# Patient Record
Sex: Female | Born: 1990 | Race: White | Hispanic: No | Marital: Married | Smoking: Never smoker
Health system: Southern US, Community
[De-identification: ages and names within clinical notes are randomized; demographics above are authoritative.]

## PROBLEM LIST (undated history)

## (undated) DIAGNOSIS — G08 Intracranial and intraspinal phlebitis and thrombophlebitis: Secondary | ICD-10-CM

## (undated) DIAGNOSIS — G932 Benign intracranial hypertension: Secondary | ICD-10-CM

## (undated) DIAGNOSIS — H4710 Unspecified papilledema: Secondary | ICD-10-CM

## (undated) DIAGNOSIS — F32A Depression, unspecified: Secondary | ICD-10-CM

## (undated) DIAGNOSIS — F411 Generalized anxiety disorder: Secondary | ICD-10-CM

## (undated) DIAGNOSIS — J45909 Unspecified asthma, uncomplicated: Secondary | ICD-10-CM

## (undated) DIAGNOSIS — F329 Major depressive disorder, single episode, unspecified: Secondary | ICD-10-CM

## (undated) HISTORY — DX: Unspecified papilledema: H47.10

## (undated) HISTORY — PX: SHUNT REVISION: SHX343

## (undated) HISTORY — PX: VENTRICULAR ATRIAL SHUNT: SHX2657

## (undated) HISTORY — DX: Generalized anxiety disorder: F41.1

## (undated) HISTORY — PX: CHOLECYSTECTOMY: SHX55

## (undated) HISTORY — DX: Intracranial and intraspinal phlebitis and thrombophlebitis: G08

---
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
## 2010-03-29 ENCOUNTER — Encounter: Payer: Self-pay | Admitting: Family Medicine

## 2010-05-17 ENCOUNTER — Ambulatory Visit: Payer: Self-pay | Admitting: Family Medicine

## 2010-05-17 DIAGNOSIS — L719 Rosacea, unspecified: Secondary | ICD-10-CM | POA: Insufficient documentation

## 2010-07-04 ENCOUNTER — Telehealth (INDEPENDENT_AMBULATORY_CARE_PROVIDER_SITE_OTHER): Payer: Self-pay | Admitting: *Deleted

## 2010-08-16 ENCOUNTER — Encounter: Payer: Self-pay | Admitting: Family Medicine

## 2010-09-10 HISTORY — PX: ANGIOPLASTY: SHX39

## 2010-09-25 ENCOUNTER — Telehealth: Payer: Self-pay | Admitting: Family Medicine

## 2010-09-25 ENCOUNTER — Ambulatory Visit
Admission: RE | Admit: 2010-09-25 | Discharge: 2010-09-25 | Payer: Self-pay | Source: Home / Self Care | Attending: Family Medicine | Admitting: Family Medicine

## 2010-09-25 ENCOUNTER — Encounter
Admission: RE | Admit: 2010-09-25 | Discharge: 2010-09-25 | Payer: Self-pay | Source: Home / Self Care | Attending: Family Medicine | Admitting: Family Medicine

## 2010-09-25 DIAGNOSIS — M79609 Pain in unspecified limb: Secondary | ICD-10-CM | POA: Insufficient documentation

## 2010-10-10 NOTE — Letter (Signed)
Summary: Medication's List/[facility]  Medication's List/[facility]   Imported By: Sherian Rein 07/12/2010 07:16:17  _____________________________________________________________________  External Attachment:    Type:   Image     Comment:   External Document

## 2010-10-10 NOTE — Progress Notes (Signed)
Summary: Adderall Refill   Phone Note Refill Request   Refills Requested: Medication #1:  ADDERALL 10 MG TABS Take 1 tablet by mouth two times a day Initial call taken by: Payton Spark CMA,  July 04, 2010 4:55 PM  Follow-up for Phone Call        Will refill once but I still need her records  Follow-up by: Nani Gasser MD,  July 05, 2010 8:02 AM  Additional Follow-up for Phone Call Additional follow up Details #1::        Pt aware of the above Additional Follow-up by: Payton Spark CMA,  July 05, 2010 8:12 AM    Prescriptions: ADDERALL 10 MG TABS (AMPHETAMINE-DEXTROAMPHETAMINE) Take 1 tablet by mouth two times a day  #60 x 0   Entered and Authorized by:   Nani Gasser MD   Signed by:   Nani Gasser MD on 07/05/2010   Method used:   Print then Give to Patient   RxID:   [id number]

## 2010-10-10 NOTE — Letter (Signed)
Summary: Date Range: 07-16-08 to 03-29-10/[facility]  Date Range: 07-16-08 to 03-29-10/[facility]   Imported By: Sherian Rein 07/12/2010 07:15:00  _____________________________________________________________________  External Attachment:    Type:   Image     Comment:   External Document

## 2010-10-10 NOTE — Assessment & Plan Note (Signed)
Summary: NOV: Perioral dermatitis   Vital Signs:  Patient profile:   20 year old female Height:      66.75 inches Weight:      208 pounds BMI:     32.94 Pulse rate:   89 / minute BP sitting:   129 / 79  (right arm) Cuff size:   regular  Vitals Entered By: Avon Gully CMA, Duncan Dull) (May 17, 2010 2:29 PM) CC: NP- rash around the mouth x several months has had oral steroids and topical steroids which helped but rash cam eback worse after finishing meds   CC:  NP- rash around the mouth x several months has had oral steroids and topical steroids which helped but rash cam eback worse after finishing meds.  History of Present Illness: NP- rash around the mouth x several months has had oral steroids and topical steroids which helped but rash cam eback worse after finishing meds. Started after she used her sisters facewash.  Steroid worked [employer] and then came back worked. Then took oral steroids adn that helped. Has perioral dermatitis.  Burning more than itichign. It is flakey and dry.    Habits & Providers  Alcohol-Tobacco-Diet     Alcohol drinks/day: 0     Tobacco Status: never  Exercise-Depression-Behavior     Does Patient Exercise: no     Drug Use: never     Seat Belt Use: always  Current Medications (verified): 1)  Adderall 10 Mg Tabs (Amphetamine-Dextroamphetamine) .... One Tablet By Mouth Once A Day  Allergies (verified): No Known Drug Allergies  Comments:  Nurse/Medical Assistant: The patient's medications and allergies were reviewed with the patient and were updated in the Medication and Allergy Lists. Avon Gully CMA, Duncan Dull) (May 17, 2010 2:31 PM)  Past History:  Past Medical History: None  Past Surgical History: None  Family History: GF with kidney Ca Aunt with BrCa GF with MI, DM Father wtih hi chol, HTN  Social History: Smoking Status:  never Does Patient Exercise:  no Drug Use:  never Seat Belt Use:  always  Review of  Systems       No fever/sweats/weakness, unexplained weight loss/gain.  No vison changes.  No difficulty hearing/ringing in ears, hay fever/allergies.  No chest pain/discomfort, palpitations.  No Br lump/nipple discharge.  No cough/wheeze.  No blood in BM, nausea/vomiting/diarrhea.  No nighttime urination, leaking urine, unusual vaginal bleeding, discharge (penis or vagina).  No muscle/joint pain. + rash,no  change in mole.  No HA, memory loss.  No anxiety, sleep d/o, depression.  No easy bruising/bleeding, unexplained lump   Physical Exam  General:  Well-developed,well-nourished,in no acute distress; alert,appropriate and cooperative throughout examination Skin:  erythematous fine papules around her mouth and on her chin. they appear dry withfine scale.  No breaks in the skin.    Impression & Recommendations:  Problem # 1:  DERMATITIS, PERIORAL (ICD-695.3) Discussed dx.  REviewed tx. Stop steroid and tx with erythromycin gel. If not helping can change to metro gel. Call back if not better in 2 weeks. She does have a derm appt in 2 weeks.   Complete Medication List: 1)  Adderall 10 Mg Tabs (Amphetamine-dextroamphetamine) .... Take 1 tablet by mouth three times a day 2)  Erythromycin 2 % Gel (Erythromycin) .... Apply two times a day to affected area.  Patient Instructions: 1)  Trial of the erythromycin gel. If not better then let me know.  Prescriptions: ERYTHROMYCIN 2 % GEL (ERYTHROMYCIN) Apply two times a day  to affected area.  #1 tube. x 0   Entered and Authorized by:   Nani Gasser MD   Signed by:   Nani Gasser MD on 05/17/2010   Method used:   Electronically to        [facility]  [address] [phone]* (retail)       [address]       [place], [place]  [address]       Ph: [phone]       Fax: [phone]   RxID:   [id number]   Appended Document: NOV: Perioral dermatitis /call from pharm no longer makes e-myacin gel. will chane to [facility] 05/21/10  acm.Prescriptions: Prescriptions: METRONIDAZOLE 0.75 % GEL (METRONIDAZOLE) apply to affected area BID  #1 tube x 0   Entered by:   Avon Gully CMA, (AAMA)   Authorized by:   Nani Gasser MD   Signed by:   Avon Gully CMA, (AAMA) on 05/17/2010   Method used:   Electronically to        [facility] [phone]* (retail)       [address]       [place], [place]  [address]       Ph: [phone]       Fax: [phone]   RxID:   [id number]   Appended Document: NOV: Perioral dermatitis

## 2010-10-12 NOTE — Assessment & Plan Note (Signed)
Summary: Foot pain   Vital Signs:  Patient profile:   20 year old female Height:      66.75 inches Weight:      211 pounds Pulse rate:   80 / minute BP sitting:   136 / 77  (right arm) Cuff size:   large  Vitals Entered By: Avon Gully CMA, Duncan Dull) (September 25, 2010 3:02 PM) CC: foot pain in rt foot x 1 1/.2 weeks, pain shoots through toes when moving toes   CC:  foot pain in rt foot x 1 1/.2 weeks and pain shoots through toes when moving toes.  History of Present Illness: foot pain in rt foot x 1 1/.2 weeks, pain shoots through toes when moving toes.  Pain on the top of her foot. pianful to touch. Some pain wiht walking but feels more like a pressure. Not sure if injured the area.  No bruising but didn initally have some swelling. Taking motrin and has put ice on it. Propped it up every night. Taking Advil and it does seem to help.   Current Medications (verified): 1)  Adderall 10 Mg Tabs (Amphetamine-Dextroamphetamine) .... Take 1 Tablet By Mouth Two Times A Day  Allergies (verified): No Known Drug Allergies  Comments:  Nurse/Medical Assistant: The patient's medications and allergies were reviewed with the patient and were updated in the Medication and Allergy Lists. Avon Gully CMA, Duncan Dull) (September 25, 2010 3:03 PM)  Physical Exam  Msk:  Right foot is tender over the dorsum near the ankle. She is able to flexion and extend her ankle and toes abut has pain with moving her toes and with eversion of her foot.  Ankle strength 5/5. Non swelling or bruising over the site. No rash.    Impression & Recommendations:  Problem # 1:  FOOT PAIN (ICD-729.5) Suspect straing for which tx is PRICE.  Will get xray to rule out fracture since she is soo tender over teh area and since she would really like ot go skiiing this weekend.   Orders: T-DG Foot 2 Views*R* (95621)  Complete Medication List: 1)  Adderall 10 Mg Tabs (Amphetamine-dextroamphetamine) .... Take 1 tablet by  mouth two times a day  Patient Instructions: 1)  We will call you with the xray resullts.    Orders Added: 1)  T-DG Foot 2 Views*R* [73620] 2)  Est. Patient Level IV [30865]

## 2010-10-12 NOTE — Letter (Signed)
Summary: Records Dated 07-16-08 thru 05-17-10/[facility]  Records Dated 07-16-08 thru 05-17-10/[facility]   Imported By: Lanelle Bal 08/25/2010 11:25:17  _____________________________________________________________________  External Attachment:    Type:   Image     Comment:   External Document

## 2010-10-12 NOTE — Progress Notes (Signed)
Summary: Refill Adderall  Phone Note Refill Request Call back at Home Phone [phone]   Refills Requested: Medication #1:  ADDERALL 10 MG TABS Take 1 tablet by mouth two times a day   Dosage confirmed as above?Dosage Confirmed   Supply Requested: 1 month   Last Refilled: 08/16/2010 Pt will be seen today at 3pm for foot injury   Method Requested: Pick up at Office Initial call taken by: Francee Piccolo CMA Duncan Dull),  September 25, 2010 10:51 AM    Prescriptions: ADDERALL 10 MG TABS (AMPHETAMINE-DEXTROAMPHETAMINE) Take 1 tablet by mouth two times a day  #60 x 0   Entered and Authorized by:   Nani Gasser MD   Signed by:   Nani Gasser MD on 09/25/2010   Method used:   Printed then faxed to ...       [facility]  [address] [phone]* (retail)       [address]       [place], [place]  [address]       Ph: [phone]       Fax: [phone]   RxID:   [id number]

## 2010-10-20 ENCOUNTER — Ambulatory Visit (INDEPENDENT_AMBULATORY_CARE_PROVIDER_SITE_OTHER): Payer: BC Managed Care – PPO | Admitting: Family Medicine

## 2010-10-20 ENCOUNTER — Encounter: Payer: Self-pay | Admitting: Family Medicine

## 2010-10-20 DIAGNOSIS — J0100 Acute maxillary sinusitis, unspecified: Secondary | ICD-10-CM | POA: Insufficient documentation

## 2010-10-26 NOTE — Letter (Signed)
Summary: Out of [facility]  [address], [address]   [place], [place] [address]   Phone: [phone]  Fax: [phone]    October 20, 2010   Student:  [employer]    To Whom It May Concern:   For Medical reasons, please excuse the above named student from school for the following dates:  Start:   February 8th and 10th, 2012  End:    Feb 11th  If you need additional information, please feel free to contact our office.   Sincerely,    Seymour Bars DO    ****This is a legal document and cannot be tampered with.  Schools are authorized to verify all information and to do so accordingly.

## 2010-10-26 NOTE — Assessment & Plan Note (Signed)
Summary: sinusitis   Vital Signs:  Patient profile:   20 year old female Height:      66.75 inches Weight:      213 pounds BMI:     33.73 O2 Sat:      98 % on Room air Pulse rate:   104 / minute BP sitting:   139 / 85  (left arm) Cuff size:   large  Vitals Entered By: Payton Spark CMA (October 20, 2010 2:36 PM)  O2 Flow:  Room air CC: head pressure and nasal drainage x 1 week.    Primary Care Provider:  Nani Gasser MD  CC:  head pressure and nasal drainage x 1 week. [name] Kitchen  History of Present Illness: 20 yo WF presents for head congestion, sinus pressure and postnasal drip x 10 days.   Taking Sudafed OTC but it's not helping.  Has a moderate to severe HA.  No cough or chest tightness.  Little sore throat.  Ears are popping but not pain painful.  She does have allergies and has had frequent sinusitis and has not been taking anything.  No fevers or chills.  Appeitite and energy level unchanged.      Current Medications (verified): 1)  Adderall 10 Mg Tabs (Amphetamine-Dextroamphetamine) .... Take 1 Tablet By Mouth Two Times A Day  Allergies (verified): No Known Drug Allergies  Past History:  Past Medical History: Reviewed history from 05/17/2010 and no changes required. None  Review of Systems      See HPI  Physical Exam  General:  alert, well-developed, well-nourished, and well-hydrated.   Head:  normocephalic and atraumatic.  maxillary sinuses TTP Eyes:  conjunctiva clear; allergic shiners present Ears:  EACs patent; TMs translucent and gray with good cone of light and bony landmarks.  Nose:  nasal congestion with boggy turbinates Mouth:  o/p mildly injected with clear postnasal drip Neck:  no masses.   Lungs:  Normal respiratory effort, chest expands symmetrically. Lungs are clear to auscultation, no crackles or wheezes. Heart:  Normal rate and regular rhythm. S1 and S2 normal without gallop, murmur, click, rub or other extra sounds. Skin:  color normal.     Cervical Nodes:  shoddy anterior cervical chain LA   Impression & Recommendations:  Problem # 1:  ACUTE MAXILLARY SINUSITIS (ICD-461.0)  Her updated medication list for this problem includes:    Amoxicillin 875 Mg Tabs (Amoxicillin) .[name] Kitchen... 1 tab by mouth two times a day with food x 10 days  Instructed on treatment. Call if symptoms persist or worsen.   Complete Medication List: 1)  Adderall 10 Mg Tabs (Amphetamine-dextroamphetamine) .... Take 1 tablet by mouth two times a day 2)  Amoxicillin 875 Mg Tabs (Amoxicillin) .[name] Kitchen.. 1 tab by mouth two times a day with food x 10 days  Patient Instructions: 1)  Treat sinusitis with 10 days of Amoxicillin. 2)  Use OTC Advil Cold and Sinus or Mucinex D for symptom relief. 3)  Call if not improved after 10 days. Prescriptions: AMOXICILLIN 875 MG TABS (AMOXICILLIN) 1 tab by mouth two times a day with food x 10 days  #20 x 0   Entered and Authorized by:   Seymour Bars DO   Signed by:   Seymour Bars DO on 10/20/2010   Method used:   Electronically to        [facility]  [id number]* (retail)       [address]       [place], [place]  [address]       Ph: [phone]       Fax: [phone]   RxID:   [id number]    Orders Added: 1)  Est. Patient Level III [27253]

## 2010-10-30 ENCOUNTER — Telehealth: Payer: Self-pay | Admitting: Family Medicine

## 2010-11-07 NOTE — Progress Notes (Signed)
  Phone Note Refill Request Message from:  Patient on October 30, 2010 1:43 PM  Refills Requested: Medication #1:  ADDERALL 10 MG TABS Take 1 tablet by mouth two times a day Initial call taken by: Avon Gully CMA, Duncan Dull),  October 30, 2010 1:43 PM    Prescriptions: ADDERALL 10 MG TABS (AMPHETAMINE-DEXTROAMPHETAMINE) Take 1 tablet by mouth two times a day  #60 x 0   Entered by:   Avon Gully CMA, (AAMA)   Authorized by:   Nani Gasser MD   Signed by:   Avon Gully CMA, (AAMA) on 10/30/2010   Method used:   Print then Give to Patient   RxID:   [id number]

## 2010-12-04 ENCOUNTER — Other Ambulatory Visit: Payer: Self-pay | Admitting: Family Medicine

## 2010-12-04 DIAGNOSIS — F988 Other specified behavioral and emotional disorders with onset usually occurring in childhood and adolescence: Secondary | ICD-10-CM

## 2010-12-04 MED ORDER — AMPHETAMINE-DEXTROAMPHETAMINE 10 MG PO TABS: 10.0000 mg | ORAL_TABLET | Freq: Two times a day (BID) | ORAL | Status: DC

## 2010-12-04 NOTE — Telephone Encounter (Signed)
Printed rx with the wrong quantity. rx is supposed to be written for 60 tabs.will shred first rx

## 2011-01-12 ENCOUNTER — Other Ambulatory Visit: Payer: Self-pay | Admitting: *Deleted

## 2011-01-12 ENCOUNTER — Other Ambulatory Visit: Payer: Self-pay | Admitting: Family Medicine

## 2011-01-12 DIAGNOSIS — F988 Other specified behavioral and emotional disorders with onset usually occurring in childhood and adolescence: Secondary | ICD-10-CM

## 2011-01-12 DIAGNOSIS — F909 Attention-deficit hyperactivity disorder, unspecified type: Secondary | ICD-10-CM

## 2011-01-12 MED ORDER — AMPHETAMINE-DEXTROAMPHETAMINE 10 MG PO TABS: 10.0000 mg | ORAL_TABLET | Freq: Two times a day (BID) | ORAL | Status: DC

## 2011-01-12 NOTE — Telephone Encounter (Signed)
Pt called and needs refill of adderall 10 mg twice daily. Plan:  Routed to Dr. Marlyne Beards, LPN Domingo Dimes

## 2011-01-12 NOTE — Telephone Encounter (Signed)
Pt picked up rx

## 2011-03-07 ENCOUNTER — Telehealth: Payer: Self-pay | Admitting: Family Medicine

## 2011-03-07 ENCOUNTER — Encounter: Payer: Self-pay | Admitting: Family Medicine

## 2011-03-07 DIAGNOSIS — F988 Other specified behavioral and emotional disorders with onset usually occurring in childhood and adolescence: Secondary | ICD-10-CM

## 2011-03-07 MED ORDER — AMPHETAMINE-DEXTROAMPHETAMINE 10 MG PO TABS: 10.0000 mg | ORAL_TABLET | Freq: Two times a day (BID) | ORAL | Status: DC

## 2011-03-07 NOTE — Telephone Encounter (Signed)
Patient called she needs Adderol refill asap

## 2011-03-07 NOTE — Telephone Encounter (Signed)
Adderall script printed out per Dr. Cathey Endow order, and will retrieve in the morning at the office, and have Dr. Cathey Endow sign. Jarvis Newcomer, LPN Domingo Dimes

## 2011-03-07 NOTE — Telephone Encounter (Signed)
OK to print and I will sign in tomorrow morning for pick up.  Thanks.

## 2011-04-18 ENCOUNTER — Ambulatory Visit (INDEPENDENT_AMBULATORY_CARE_PROVIDER_SITE_OTHER): Payer: BC Managed Care – PPO | Admitting: Family Medicine

## 2011-04-18 DIAGNOSIS — B079 Viral wart, unspecified: Secondary | ICD-10-CM

## 2011-04-18 NOTE — Progress Notes (Signed)
  Subjective:    Patient ID: Maria Payne, female    DOB: [birth year], 20 y.o.   MRN: [id number]  HPI Wart on right leg below  The knee that started about 2 months ago.  She tried OTC freezing and helped but then came back. She has had a plantar wart before.     Review of Systems     Objective:   Physical Exam  Skin:       She has 0.75 cm wart on her left leg just below her knee. It is irritated and has some bleeding.            Assessment & Plan:  Inflammed wart - Discussed can do cryotherapy and the duct tape method. REviewed this and gave her H.O. On how to do the duct tape method. Unfortunately we are our of cryotherapy so she return on Friday and we will do this for free.

## 2011-04-27 ENCOUNTER — Encounter: Payer: Self-pay | Admitting: Family Medicine

## 2011-04-27 ENCOUNTER — Ambulatory Visit (INDEPENDENT_AMBULATORY_CARE_PROVIDER_SITE_OTHER): Payer: BC Managed Care – PPO | Admitting: Family Medicine

## 2011-04-27 VITALS — BP 113/75 | HR 93 | Wt 230.0 lb

## 2011-04-27 DIAGNOSIS — F988 Other specified behavioral and emotional disorders with onset usually occurring in childhood and adolescence: Secondary | ICD-10-CM

## 2011-04-27 MED ORDER — AMPHETAMINE-DEXTROAMPHETAMINE 10 MG PO TABS: 10.0000 mg | ORAL_TABLET | Freq: Two times a day (BID) | ORAL | Status: DC

## 2011-04-27 NOTE — Progress Notes (Signed)
  Subjective:    Patient ID: Maria Payne, female    DOB: [birth year], 20 y.o.   MRN: [id number]  HPI Here for cryotherapy on her wart on Right shin.  We were out of liquid nitrogen when she was here last time so here today.  Crytherapy was performed and pt tolerated well.    Review of Systems     Objective:   Physical Exam        Assessment & Plan:

## 2011-06-22 ENCOUNTER — Other Ambulatory Visit: Payer: Self-pay | Admitting: *Deleted

## 2011-06-22 DIAGNOSIS — F988 Other specified behavioral and emotional disorders with onset usually occurring in childhood and adolescence: Secondary | ICD-10-CM

## 2011-06-22 MED ORDER — AMPHETAMINE-DEXTROAMPHETAMINE 10 MG PO TABS: 10.0000 mg | ORAL_TABLET | Freq: Two times a day (BID) | ORAL | Status: DC

## 2011-06-26 ENCOUNTER — Telehealth: Payer: Self-pay | Admitting: Family Medicine

## 2011-06-26 NOTE — Telephone Encounter (Signed)
Pt notified, and had to [facility] instructing her that her adderall prescription was ready for pup. Jarvis Newcomer, LPN Domingo Dimes

## 2011-08-10 ENCOUNTER — Other Ambulatory Visit: Payer: Self-pay

## 2011-08-10 DIAGNOSIS — R519 Headache, unspecified: Secondary | ICD-10-CM

## 2011-08-12 ENCOUNTER — Ambulatory Visit
Admission: RE | Admit: 2011-08-12 | Discharge: 2011-08-12 | Disposition: A | Discharge status: Home / Self Care | Payer: BC Managed Care – PPO | Source: Ambulatory Visit

## 2011-08-12 DIAGNOSIS — R519 Headache, unspecified: Secondary | ICD-10-CM

## 2011-08-12 MED ORDER — GADOBENATE DIMEGLUMINE 529 MG/ML IV SOLN
20.0000 mL | Freq: Once | INTRAVENOUS | Status: AC | PRN
  Administered 2011-08-12: 20 mL via INTRAVENOUS

## 2011-08-14 ENCOUNTER — Telehealth: Payer: Self-pay | Admitting: *Deleted

## 2011-08-14 NOTE — Telephone Encounter (Signed)
Pt calls and would like results of her MRI she had done. Her eye doc ordered it because of abnormal eye exam and sent her downstairs to get it so you would get results. Looked at results under imaging tab and looks like ? MS. Please advise

## 2011-08-14 NOTE — Telephone Encounter (Signed)
Have we gotten a note from optho? I feel uncomfortable going over results on a test I haven't ordered and I havent' seen her in months. She really needs to ave her ophtho review with her and then if he wants me to address for referrals or such then I would be happy to. I haven't heard from optho at all.

## 2011-08-20 DIAGNOSIS — H4711 Papilledema associated with increased intracranial pressure: Secondary | ICD-10-CM | POA: Insufficient documentation

## 2011-09-10 ENCOUNTER — Encounter: Payer: Self-pay | Admitting: Family Medicine

## 2011-09-10 DIAGNOSIS — G08 Intracranial and intraspinal phlebitis and thrombophlebitis: Secondary | ICD-10-CM

## 2011-09-10 DIAGNOSIS — G932 Benign intracranial hypertension: Secondary | ICD-10-CM | POA: Insufficient documentation

## 2011-09-10 HISTORY — DX: Intracranial and intraspinal phlebitis and thrombophlebitis: G08

## 2011-12-26 ENCOUNTER — Encounter: Payer: Self-pay | Admitting: Family Medicine

## 2011-12-26 ENCOUNTER — Telehealth: Payer: Self-pay | Admitting: *Deleted

## 2011-12-26 ENCOUNTER — Other Ambulatory Visit (HOSPITAL_COMMUNITY)
Admission: RE | Admit: 2011-12-26 | Discharge: 2011-12-26 | Disposition: A | Discharge status: Home / Self Care | Payer: BC Managed Care – PPO | Source: Ambulatory Visit | Attending: Family Medicine | Admitting: Family Medicine

## 2011-12-26 ENCOUNTER — Ambulatory Visit (INDEPENDENT_AMBULATORY_CARE_PROVIDER_SITE_OTHER): Payer: BC Managed Care – PPO | Admitting: Family Medicine

## 2011-12-26 VITALS — BP 120/81 | HR 82 | Ht 68.0 in | Wt 232.0 lb

## 2011-12-26 DIAGNOSIS — Z01419 Encounter for gynecological examination (general) (routine) without abnormal findings: Secondary | ICD-10-CM

## 2011-12-26 MED ORDER — CLINDAMYCIN PHOSPHATE 1 % EX GEL: Freq: Two times a day (BID) | CUTANEOUS | Status: DC

## 2011-12-26 NOTE — Telephone Encounter (Signed)
Ok, please call Tatiana and let her know that Dr. Craige Cotta says not at this time. Maybe we an re-eval in 6-12 months.  Certainly can use condoms to prevent pregnancy for now.

## 2011-12-26 NOTE — Telephone Encounter (Signed)
Nurse from Dr. Evlyn Courier office called back and states Dr. Craige Cotta does not recommend pt start birth control at this time due to the hormonal effect this would have on her condition. She also advised that the pt should lose at least another 30lbs or so before considering starting birth [phone] Dr. Evlyn Courier office)

## 2011-12-26 NOTE — Patient Instructions (Signed)
Start a regular exercise program and make sure you are eating a healthy diet Try to eat 4 servings of dairy a day or take a calcium supplement (500mg twice a day). Your vaccines are up to date.  We will call you with your lab results. If you don't here from us in about a week then please give us a call at [phone].  

## 2011-12-26 NOTE — Progress Notes (Signed)
Subjective:     Maria Payne is a 21 y.o. female and is here for a comprehensive physical exam. The patient reports no problems.She is getting married soon and wants to start birth control. SHe has a hx of pseudotumor cerebri and recently had to have angioplasty. Her neurologist is Dr. Loyal Gambler in [facility].  History   Social History  . Marital Status: Single    Spouse Name: N/A    Number of Children: N/A  . Years of Education: N/A   Occupational History  . unemployed.     Social History Main Topics  . Smoking status: Never Smoker   . Smokeless tobacco: Not on file  . Alcohol Use: Yes     occ  . Drug Use: Not on file  . Sexually Active: Yes -- Female partner(s)   Other Topics Concern  . Not on file   Social History Narrative  . No narrative on file   Health Maintenance  Topic Date Due  . Pap Smear  02/22/2008  . Tetanus/tdap  02/21/2009  . Influenza Vaccine  06/10/2012    The following portions of the patient's history were reviewed and updated as appropriate: allergies, current medications, past family history, past medical history, past social history, past surgical history and problem list.  Review of Systems A comprehensive review of systems was negative.   Objective:    BP 120/81  Pulse 82  Ht 5\' 8"  (1.727 m)  Wt 232 lb (105.235 kg)  BMI 35.28 kg/m2  LMP 12/16/2011 General appearance: alert, cooperative, appears stated age and moderately obese Head: Normocephalic, without obvious abnormality, atraumatic Eyes: conj clear, EOMi, PEERLA Ears: normal TM's and external ear canals both ears Nose: Nares normal. Septum midline. Mucosa normal. No drainage or sinus tenderness. Throat: lips, mucosa, and tongue normal; teeth and gums normal Neck: no adenopathy, no carotid bruit, supple, symmetrical, trachea midline and thyroid not enlarged, symmetric, no tenderness/mass/nodules Back: symmetric, no curvature. ROM normal. No CVA tenderness. Lungs: clear to  auscultation bilaterally Breasts: normal appearance, no masses or tenderness Heart: regular rate and rhythm, S1, S2 normal, no murmur, click, rub or gallop Abdomen: soft, non-tender; bowel sounds normal; no masses,  no organomegaly Pelvic: cervix normal in appearance, external genitalia normal, no adnexal masses or tenderness, no cervical motion tenderness, rectovaginal septum normal, uterus normal size, shape, and consistency, vagina normal without discharge and she does have a inflammed hair follicle on her left labia Extremities: extremities normal, atraumatic, no cyanosis or edema Pulses: 2+ and symmetric Skin: Skin color, texture, turgor normal. No rashes or lesions, she does have a mole under her left breast that appears benign but is where her underwire sits.  Lymph nodes: Cervical, supraclavicular, and axillary nodes normal. Neurologic: Grossly normal    Assessment:    Healthy female exam.      Plan:    See After Visit Summary for Counseling Recommendations  Start a regular exercise program and make sure you are eating a healthy diet Try to eat 4 servings of dairy a day or take a calcium supplement (500mg  twice a day). Your vaccines are up to date.  Due for screening labs, CMP and lipids  Folliculitis - rec warm compresses and apply clindagel bid to affected area. Call if not clearing up in 2-3 weeks  Nevus under left breast - Recommen removal since gets very easiy irritated.  Patient says she has noticed some color change as well.   Will call her neurologist, Dr. Craige Cotta, so see  if Ok to start birth control. I do not believe there is a contraindication because of her pseudotumor cerebra but because of her recent angioplasty there may be a contraindication because of increased risk of thrombosis with birth control.

## 2011-12-27 LAB — COMPLETE METABOLIC PANEL WITH GFR
ALT: 8 U/L (ref 0–35)
AST: 12 U/L (ref 0–37)
Albumin: 4.5 g/dL (ref 3.5–5.2)
Alkaline Phosphatase: 60 U/L (ref 39–117)
BUN: 23 mg/dL (ref 6–23)
CO2: 21 mEq/L (ref 19–32)
Calcium: 9.3 mg/dL (ref 8.4–10.5)
Chloride: 113 mEq/L — ABNORMAL HIGH (ref 96–112)
Creat: 0.95 mg/dL (ref 0.50–1.10)
GFR, Est African American: >89 mL/min
GFR, Est Non African American: 86 mL/min
Glucose, Bld: 101 mg/dL — ABNORMAL HIGH (ref 70–99)
Potassium: 4.1 mEq/L (ref 3.5–5.3)
Sodium: 144 mEq/L (ref 135–145)
Total Bilirubin: 0.2 mg/dL — ABNORMAL LOW (ref 0.3–1.2)
Total Protein: 6.8 g/dL (ref 6.0–8.3)

## 2011-12-27 LAB — LIPID PANEL
Cholesterol: 179 mg/dL (ref 0–200)
HDL: 37 mg/dL — ABNORMAL LOW (ref >39)
LDL Cholesterol: 116 mg/dL — ABNORMAL HIGH (ref 0–99)
Total CHOL/HDL Ratio: 4.8 Ratio
Triglycerides: 130 mg/dL (ref <150)
VLDL: 26 mg/dL (ref 0–40)

## 2011-12-27 NOTE — Telephone Encounter (Signed)
Left message on vm with both doctors advise

## 2012-04-04 ENCOUNTER — Ambulatory Visit (INDEPENDENT_AMBULATORY_CARE_PROVIDER_SITE_OTHER): Payer: BC Managed Care – PPO | Admitting: Psychiatry

## 2012-04-04 ENCOUNTER — Encounter (HOSPITAL_COMMUNITY): Payer: Self-pay | Admitting: Psychiatry

## 2012-04-04 VITALS — BP 118/78 | Ht 68.0 in | Wt 234.0 lb

## 2012-04-04 DIAGNOSIS — F411 Generalized anxiety disorder: Secondary | ICD-10-CM

## 2012-04-04 DIAGNOSIS — F329 Major depressive disorder, single episode, unspecified: Secondary | ICD-10-CM

## 2012-04-04 HISTORY — DX: Generalized anxiety disorder: F41.1

## 2012-04-04 MED ORDER — BUPROPION HCL ER (XL) 150 MG PO TB24: 150.0000 mg | ORAL_TABLET | ORAL | Status: DC

## 2012-04-04 NOTE — Progress Notes (Addendum)
Outpatient Psychiatry Initial Intake  04/04/2012  Orlan Leavens, a 21 y.o. female, for initial evaluation visit. Patient is referred by  her neurologist, Dr. Loyal Gambler.    HPI: The patient is a 21 year old female who is referred by her neurologist for evaluation and treatment of depressive disorder. The patient reports her depression began approximately 2 years ago. The man she considered to be a second father figure ground while at the beach. She felt that she has never bounced back from this. Soon after, the patient started having severe headaches. Her ophthalmologist noticed that her optic nerves were swollen. There was concern about her having blood clots. The patient had an MRI/MRA. 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 She had angioplasty last year. She continues to still get the headaches. The patient was diagnosed with ADHD in elementary school. She has been off of her stimulants for approximately 3 years. She reports she did see a psychiatrist in [place] a few years ago. She was diagnosed at that time with bipolar disorder. She states that she was placed on the wrong medications, and stopped going to see her. The patient endorses low energy and low mood. She is not working. 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 She lives with her parents, but is getting married in September. Both her fianc and dad are frustrated with her that she is not working. The patient reports she gets out of bed around 10 AM. She eats breakfast and then lies back down. She endorses poor sleep at night. She will be up until about 3 AM. She states her mother is the same way. She reports that when she was growing up, her mother had severe anxiety and depression along with agorophobia. The patient's mom basically locked herself in the bedroom, and the patient's sister who is 5 years older raised her and the third sister. The patient endorses high  levels of anxiety, which aren't as bad right now. She states that she is over planner. She states that everything has to be just right where she will stressed about it. She states that she is more depressed at the current time. She tends to cry more. She endorses fleeting suicidal ideation without plan or intent. She thinks what would happen if she wrecked her car. He denies any hallucinations. She denies any substance abuse. The patient has gained weight over the past 2 or 3 years. She is very unhappy about this. She feels that the Adderall helped keep her weight down. She endorses good focus and attention now that she's not in school. Her fianc is a Teacher, early years/pre at [employer]. He wants to become a Optician, dispensing. Her father is in [employer], and one of her brother-in-law's is a Optician, dispensing also. Filed Vitals:   04/04/12 0938  BP: 118/78     Physical Illness:  Patient diagnosed with pseudotumor cerebri yet along with idiopathic intracranial hypertension last year.  Current Medications: Scheduled Meds:  Topamax Lasix  Allergies: Allergies  Allergen Reactions  . Codeine     Stressors:  Living with parents, wedding this year, health problems.  History:   Past Psychiatric History:  Previous therapy: no Previous psychiatric treatment and medication trials: yes - diagnosed with ADHD in the past. Previous trials of Adderall and Ritalin. Diagnosed bipolar by psychiatrist to [facility]. Patient is also been on Cymbalta in the past. Previous psychiatric hospitalizations: yes - hospitalized at age 43  at [facility] Previous diagnoses: yes - as above Previous suicide attempts: no History of violence: no Currently in treatment with no one.  Family Psychiatric History: Mom is depression, anxiety, and agorophobia. Dad with a history of alcohol and substance abuse. Middle sister with depression.  Family Health History: Dad with hypertension and MGUS (a condition  similar to multiple myeloma)  Personal and Social History: Patient is engaged to be married to her boyfriend of 3 years. She currently lives with her parents. She is the youngest of 3 girls. She has a difficult time with her father, and her middle sister.   Education: The patient graduated high school.  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 She is currently not working.   Review Of Systems:   Medical Review Of Systems: Constitutional: negative, Except for daily headaches  Psychiatric Review Of Systems: Sleep: no Appetite changes: yes Weight changes: yes Energy: no Interest/pleasure/anhedonia: no Somatic symptoms: no Libido: yes Anxiety/panic: yes Guilty/hopeless: yes Self-injurious behavior/risky behavior: no Any drugs: no Alcohol: no   Current Evaluation:    Mental Status Evaluation: Appearance:  casually dressed and overweight  Behavior:  normal  Speech:  soft  Mood:  depressed  Affect:  constricted  Thought Process:  normal  Thought Content:  normal  Sensorium:  person, place, time/date and situation  Cognition:  grossly intact  Insight:  fair  Judgment:  fair        Assessment - Diagnosis - Goals:   Axis I: Generalized Anxiety Disorder and Major Depression, single episode Axis II: Deferred Axis III: Obesity, pseudotumor cerebrally, idiopathic intracranial hypertension Axis IV: occupational problems and problems related to social environment Axis V: 51-60 moderate symptoms   Treatment Plan/Recommendations: I have conferred with Dr. Craige Cotta. I will start the patient on Wellbutrin XL 150 mg daily. Patient may call in 3-4 weeks to increase. I will see the patient back in 6 weeks. She may benefit from the addition of an SSRI in the future. Patient may call with concerns.     Blades, Etana Beets PATRICIA

## 2012-05-16 ENCOUNTER — Ambulatory Visit (HOSPITAL_COMMUNITY): Payer: Self-pay | Admitting: Psychiatry

## 2012-05-20 ENCOUNTER — Ambulatory Visit (HOSPITAL_COMMUNITY): Payer: Self-pay | Admitting: Psychiatry

## 2012-07-02 ENCOUNTER — Encounter (HOSPITAL_COMMUNITY): Payer: Self-pay | Admitting: Psychiatry

## 2012-07-02 ENCOUNTER — Ambulatory Visit (INDEPENDENT_AMBULATORY_CARE_PROVIDER_SITE_OTHER): Payer: BC Managed Care – PPO | Admitting: Psychiatry

## 2012-07-02 VITALS — BP 138/95 | Ht 68.0 in | Wt 243.0 lb

## 2012-07-02 DIAGNOSIS — F411 Generalized anxiety disorder: Secondary | ICD-10-CM

## 2012-07-02 DIAGNOSIS — F329 Major depressive disorder, single episode, unspecified: Secondary | ICD-10-CM

## 2012-07-02 MED ORDER — BUPROPION HCL ER (XL) 300 MG PO TB24: 300.0000 mg | ORAL_TABLET | ORAL | Status: DC

## 2012-07-02 NOTE — Progress Notes (Signed)
   [facility] Behavioral Health Follow-up Outpatient Visit  Maria Payne 1990/10/17   Subjective: The patient is a 21 year old flame on who was seen for initial psychiatric assessment in July of 2013. She had been referred by Dr. Craige Cotta. She had depression going on and off for 2 years. It all precipitated by a father figure who drowned at the beach. Soon after the patient began having severe headaches. She saw Dr. Craige Cotta and was diagnosed with pseudotumor cerebri. At her initial appointment, the patient was sleeping all the time. She wasn't working. She gained weight over the previous 2-3 years. She was engaged to be married in September. The patient had a history of ADHD but it been off stimulants for 3 years. At her initial appointment, I diagnosed her with major depressive disorder along with generalized anxiety disorder. I started her on Wellbutrin XL 150 mg daily. She was to return to the office in 6 weeks. The patient presents today almost 3 months later. She does get married in September. She has been consistent with her Wellbutrin XL. She has started working at [employer] and has been there now for about one month. She is standing a lot, which is difficult. There is one girl at work who is giving her a hard time. She and her husband worked different schedules. Today is the first day they have all together since their honeymoon.  The patient feels benefits from the Wellbutrin XL. She is sleeping better at night. She is finding it easier to calm down when she gets upset. She's not tired all day. Her mood is better. She still having rare headaches. Currently she has a sinus infection and took medication right before the visit. Her blood pressure is up today.  Filed Vitals:   07/02/12 1314  BP: 138/95    Mental Status Examination  Appearance: Casually dressed, obese Alert: Yes Attention: good  Cooperative: Yes Eye Contact: Good Speech: Regular rate rhythm and volume Psychomotor Activity:  Normal Memory/Concentration: Intact Oriented: person, place, time/date and situation Mood: Euthymic Affect: Blunt Thought Processes and Associations: Goal Directed and Logical Fund of Knowledge: Fair Thought Content: No suicidal or homicidal thoughts Insight: Fair Judgement: Fair  Diagnosis: Depressive disorder, generalized anxiety disorder  Treatment Plan: I will increase the Wellbutrin XL to 300 mg daily. I do believe the increase blood pressure today is secondary to recent intake of cold medication. I have asked the patient to monitor her blood pressure. Her father has a blood pressure cuff at home. I will see her back in 2 months. Patient to call with concerns.  Jamse Mead, MD

## 2012-07-08 ENCOUNTER — Emergency Department (INDEPENDENT_AMBULATORY_CARE_PROVIDER_SITE_OTHER)
Admission: EM | Admit: 2012-07-08 | Discharge: 2012-07-08 | Disposition: A | Discharge status: Home / Self Care | Payer: BC Managed Care – PPO | Source: Home / Self Care | Attending: Family Medicine | Admitting: Family Medicine

## 2012-07-08 ENCOUNTER — Encounter: Payer: Self-pay | Admitting: *Deleted

## 2012-07-08 DIAGNOSIS — J0100 Acute maxillary sinusitis, unspecified: Secondary | ICD-10-CM

## 2012-07-08 DIAGNOSIS — J45909 Unspecified asthma, uncomplicated: Secondary | ICD-10-CM

## 2012-07-08 HISTORY — DX: Benign intracranial hypertension: G93.2

## 2012-07-08 LAB — POCT RAPID STREP A (OFFICE): Rapid Strep A Screen: NEGATIVE

## 2012-07-08 MED ORDER — PREDNISONE 20 MG PO TABS: 20.0000 mg | ORAL_TABLET | Freq: Two times a day (BID) | ORAL | Status: DC

## 2012-07-08 MED ORDER — AMOXICILLIN 875 MG PO TABS: 875.0000 mg | ORAL_TABLET | Freq: Two times a day (BID) | ORAL | Status: DC

## 2012-07-08 MED ORDER — BENZONATATE 200 MG PO CAPS: 200.0000 mg | ORAL_CAPSULE | Freq: Every day | ORAL | Status: DC

## 2012-07-08 MED ORDER — ALBUTEROL SULFATE HFA 108 (90 BASE) MCG/ACT IN AERS: 2.0000 | INHALATION_SPRAY | RESPIRATORY_TRACT | Status: DC | PRN

## 2012-07-08 NOTE — ED Notes (Signed)
Pt c/o sore throat, cough, fever, body aches, and runny nose x 1wk.

## 2012-07-08 NOTE — ED Provider Notes (Signed)
History     CSN: [id number]  Arrival date & time 07/08/12  1327   First MD Initiated Contact with Patient 07/08/12 1409      Chief Complaint  Patient presents with  . Sore Throat  . Cough  . Fever      HPI Comments: Patient complains of approximately 7 day history of gradually progressive URI symptoms beginning with a  sore throat (now worse), followed by progressive nasal congestion.  A cough started next.  Complains of fatigue and initial myalgias.  Cough is now worse at night and generally non-productive during the day.  There has been no pleuritic pain although she has tightness in her anterior chest. She complains of occasional wheezing but no shortness of breath.  She has a history of seasonal allergies and mild asthma, and during the past week has had to use her albuterol MDI several times.   The history is provided by the patient.    Past Medical History  Diagnosis Date  . Pseudotumor cerebri     Past Surgical History  Procedure Date  . Angioplasty     ? cerebral artery     Family History  Problem Relation Age of Onset  . Hypertension Father   . Heart disease Father     History  Substance Use Topics  . Smoking status: Never Smoker   . Smokeless tobacco: Not on file  . Alcohol Use: Yes     occ    OB History    Grav Para Term Preterm Abortions TAB SAB Ect Mult Living                  Review of Systems + sore throat + cough No pleuritic pain + wheezing + nasal congestion + post-nasal drainage + sinus pain/pressure No itchy/red eyes + earache No hemoptysis No SOB + fever/chills No nausea No vomiting No abdominal pain No diarrhea No urinary symptoms No skin rashes + fatigue + myalgias + headache Used OTC meds without relief  Allergies  Codeine  Home Medications   Current Outpatient Rx  Name Route Sig Dispense Refill  . ACETAZOLAMIDE ER 500 MG PO CP12 Oral Take 500 mg by mouth 2 (two) times daily. Take one in the a.m. And two qhs      . ALBUTEROL SULFATE HFA 108 (90 BASE) MCG/ACT IN AERS Inhalation Inhale 2 puffs into the lungs every 4 (four) hours as needed for wheezing. 1 Inhaler 1  . ALBUTEROL IN Inhalation Inhale into the lungs.      . AMOXICILLIN 875 MG PO TABS Oral Take 1 tablet (875 mg total) by mouth 2 (two) times daily. 20 tablet 0  . BENZONATATE 200 MG PO CAPS Oral Take 1 capsule (200 mg total) by mouth at bedtime. Take as needed for cough 12 capsule 0  . BUPROPION HCL ER (XL) 300 MG PO TB24 Oral Take 1 tablet (300 mg total) by mouth every morning. 30 tablet 2  . LASIX PO Oral Take by mouth daily.    [name] Kitchen PREDNISONE 20 MG PO TABS Oral Take 1 tablet (20 mg total) by mouth 2 (two) times daily. Take with food. 10 tablet 0  . TOPAMAX PO Oral Take by mouth 2 (two) times daily.      BP 123/88  Pulse 94  Temp 98.4 F (36.9 C) (Oral)  Resp 16  Ht 5\' 7"  (1.702 m)  Wt 244 lb (110.678 kg)  BMI 38.22 kg/m2  SpO2 98%  LMP 06/14/2012  Physical  Exam Nursing notes and Vital Signs reviewed. Appearance:  Patient appears stated age, and in no acute distress.  Patient is obese (BMI 38.2) Eyes:  Pupils are equal, round, and reactive to light and accomodation.  Extraocular movement is intact.  Conjunctivae are not inflamed  Ears:  Canals normal.  Tympanic membranes normal.  Nose:  Moderatedly congested turbinates.  Maxillary sinus tenderness is present.  Pharynx:   Minimal erythema Neck:  Supple.  Slightly tender shotty anterior/posterior nodes are palpated bilaterally  Lungs:  Few scattered wheezes; no rales or rhonchi.  Breath sounds are equal.  Heart:  Regular rate and rhythm without murmurs, rubs, or gallops.  Abdomen:  Nontender without masses or hepatosplenomegaly.  Bowel sounds are present.  No CVA or flank tenderness.  Extremities:  No edema.  No calf tenderness Skin:  No rash present.   ED Course  Procedures none   Labs Reviewed  POCT RAPID STREP A (OFFICE) negative      1. Acute maxillary sinusitis   2.  Asthmatic bronchitis       MDM  Begin amoxicillin.  Prescription written for Benzonatate [facility]) to take at bedtime for night-time cough.  Prednisone burst. Take plain Mucinex (guaifenesin) twice daily for cough and congestion.  Increase fluid intake, rest.  Also recommend using saline nasal spray several times daily and saline nasal irrigation (AYR is a common brand) Stop all antihistamines for now, and other non-prescription cough/cold preparations. Continue albuterol inhaler as needed. Follow-up with family doctor if not improving 7 to 10 days.         Lattie Haw, MD 07/09/12 1055

## 2012-07-10 ENCOUNTER — Telehealth: Payer: Self-pay | Admitting: *Deleted

## 2012-07-27 ENCOUNTER — Encounter: Payer: Self-pay | Admitting: *Deleted

## 2012-07-27 ENCOUNTER — Emergency Department (INDEPENDENT_AMBULATORY_CARE_PROVIDER_SITE_OTHER)
Admission: EM | Admit: 2012-07-27 | Discharge: 2012-07-27 | Disposition: A | Discharge status: Home / Self Care | Payer: BC Managed Care – PPO | Source: Home / Self Care | Attending: Emergency Medicine | Admitting: Emergency Medicine

## 2012-07-27 DIAGNOSIS — R079 Chest pain, unspecified: Secondary | ICD-10-CM

## 2012-07-27 DIAGNOSIS — L508 Other urticaria: Secondary | ICD-10-CM

## 2012-07-27 MED ORDER — HYDROXYZINE HCL 25 MG PO TABS: 25.0000 mg | ORAL_TABLET | Freq: Three times a day (TID) | ORAL | Status: DC | PRN

## 2012-07-27 NOTE — ED Provider Notes (Signed)
History     CSN: [id number]  Arrival date & time 07/27/12  1140   First MD Initiated Contact with Patient 07/27/12 1147      Chief Complaint  Patient presents with  . Rash  . Chest Pain    anxiety and asthma     Patient is a 21 y.o. female presenting with rash and chest pain. The history is provided by the patient.  Rash  This is a new problem. The current episode started 2 days ago. The problem has not changed since onset.The problem is associated with an unknown (Anxiety) factor. There has been no fever. The rash is present on the torso and neck. Associated symptoms include itching. Pertinent negatives include no blisters, no pain and no weeping. She has tried nothing for the symptoms. Risk factors: Denies any new medications, except for starting Wellbutrin 2 months ago, which she has tolerated well.  Chest Pain The chest pain began yesterday. Episode Length: It's just constant for the past day. Chest pain occurs constantly. The chest pain is unchanged. The pain is associated with stress (No exertional chest pain.). At its most intense, the pain is at 7/10. The pain is currently at 3/10. The quality of the pain is described as tightness (The tightness feels worse when she feels anxious .). The pain does not radiate. Chest pain is worsened by certain positions and stress. Primary symptoms include fatigue, shortness of breath (She admits to a vague feeling of shortness of breath, even while rechecked the pulse ox of 100% on room air) and nausea (Minimal). Pertinent negatives for primary symptoms include no fever, no syncope, no cough, no wheezing, no palpitations, no abdominal pain, no vomiting, no dizziness and no altered mental status.  Pertinent negatives for associated symptoms include no claudication, no diaphoresis, no lower extremity edema, no near-syncope, no numbness, no orthopnea, no paroxysmal nocturnal dyspnea and no weakness. Risk factors include obesity.  Her past medical history  is significant for anxiety/panic attacks (Being treated for generalized anxiety disorder and major depression disorder by Dr. Daphine Deutscher in behavioral health at [facility]  Pertinent negatives for past medical history include no CAD and no DVT. Past medical history comments: Pseudotumor cerebri. Controlled.--He had some type of angioplasty for an artery in her head December 2012.--Denies any history of heart disease.     Past Medical History  Diagnosis Date  . Pseudotumor cerebri     Past Surgical History  Procedure Date  . Angioplasty     ? cerebral artery     Family History  Problem Relation Age of Onset  . Hypertension Father   . Heart disease Father     History  Substance Use Topics  . Smoking status: Never Smoker   . Smokeless tobacco: Not on file  . Alcohol Use: Yes     Comment: occ    OB History    Grav Para Term Preterm Abortions TAB SAB Ect Mult Living                  Review of Systems  Constitutional: Positive for fatigue. Negative for fever and diaphoresis.  Respiratory: Positive for shortness of breath (She admits to a vague feeling of shortness of breath, even while rechecked the pulse ox of 100% on room air). Negative for cough and wheezing.   Cardiovascular: Positive for chest pain. Negative for palpitations, orthopnea, claudication, syncope and near-syncope.  Gastrointestinal: Positive for nausea (Minimal). Negative for vomiting and abdominal pain.  Skin: Positive for  itching and rash.  Neurological: Negative for dizziness, weakness and numbness.  Psychiatric/Behavioral: Negative for altered mental status.   She denies chance of pregnancy . Allergies  Codeine  Home Medications   Current Outpatient Rx  Name  Route  Sig  Dispense  Refill  . ACETAZOLAMIDE ER 500 MG PO CP12   Oral   Take 500 mg by mouth 2 (two) times daily. Take one in the a.m. And two qhs         . ALBUTEROL SULFATE HFA 108 (90 BASE) MCG/ACT IN AERS   Inhalation    Inhale 2 puffs into the lungs every 4 (four) hours as needed for wheezing.   1 Inhaler   1   . ALBUTEROL IN   Inhalation   Inhale into the lungs.           . AMOXICILLIN 875 MG PO TABS   Oral   Take 1 tablet (875 mg total) by mouth 2 (two) times daily.   20 tablet   0   . BENZONATATE 200 MG PO CAPS   Oral   Take 1 capsule (200 mg total) by mouth at bedtime. Take as needed for cough   12 capsule   0   . BUPROPION HCL ER (XL) 300 MG PO TB24   Oral   Take 1 tablet (300 mg total) by mouth every morning.   30 tablet   2   . LASIX PO   Oral   Take by mouth daily.         [name] Kitchen HYDROXYZINE HCL 25 MG PO TABS   Oral   Take 1 tablet (25 mg total) by mouth every 8 (eight) hours as needed for itching. May cause drowsiness.   20 tablet   0   . PREDNISONE 20 MG PO TABS   Oral   Take 1 tablet (20 mg total) by mouth 2 (two) times daily. Take with food.   10 tablet   0   . TOPAMAX PO   Oral   Take by mouth 2 (two) times daily.           BP 128/85  Pulse 110  Temp 98.1 F (36.7 C) (Oral)  Resp 20  Ht 5\' 8"  (1.727 m)  Wt 245 lb 8 oz (111.358 kg)  BMI 37.33 kg/m2  SpO2 100%  LMP 06/14/2012  Physical Exam  Nursing note and vitals reviewed. Constitutional: She is oriented to person, place, and time. She appears well-developed and well-nourished. No distress.       She is anxious, but alert and cooperative. Appears very uncomfortable, but no acute distress. No acute cardiorespiratory distress.  HENT:  Head: Normocephalic and atraumatic.  Mouth/Throat: Oropharynx is clear and moist.       No lip swelling. Oropharynx clear, airway intact .  Eyes: Conjunctivae normal and EOM are normal. Pupils are equal, round, and reactive to light. No scleral icterus.  Neck: Normal range of motion. Neck supple. No JVD present. No tracheal deviation present. No thyromegaly present.  Cardiovascular: Regular rhythm, normal heart sounds and intact distal pulses.  Exam reveals no gallop and  no friction rub.   No murmur heard.      Mild tachycardia.  Pulmonary/Chest: Effort normal and breath sounds normal. No respiratory distress. She has no wheezes. She has no rales. She exhibits no tenderness.  Abdominal: Soft. Bowel sounds are normal. There is no tenderness.  Musculoskeletal: Normal range of motion. She exhibits no edema and no tenderness.  Lymphadenopathy:    She has no cervical adenopathy.  Neurological: She is alert and oriented to person, place, and time. She has normal reflexes. She displays normal reflexes. No cranial nerve deficit. She exhibits normal muscle tone. Coordination normal.  Skin: She is not diaphoretic.       She has mild acne on her face and chest and back which she states is her usual baseline.  She has an acute blanching morbilliform maculopapular rash on anterior neck and anterior chest. No red streaks, fluctuance, or sign of infection or cellulitis.  Psychiatric:       Mildly anxious.    ED Course  Procedures (including critical care time)  Labs Reviewed - No data to display No results found.   1. Chest pain   2. Urticaria, acute     EKG shows normal sinus rhythm, rate 96. Within normal limits, no acute abnormalities. I discussed and reviewed this with patient.   MDM  Localized urticaria, unknown cause. Could be neurodermatitis from anxiety or other causes. No evidence of acute skin infection. No evidence of any airway or cardiorespiratory compromise.  The chest discomfort is likely from anxiety, and as we discussed this and the normal EKG.  She stated that her chest discomfort and anxiety was improving during our discussion. She declined a shot of Benadryl at this time. She declined treating with steroids. Will treat with cool compresses and other symptomatic care. Hydroxyzine 25 mg by mouth 3 times a day. Precautions and red flags discussed. Followup with PCP and psychiatrist.--- She agrees with above plans.         Lajean Manes,  MD 07/27/12 2007

## 2012-07-27 NOTE — ED Notes (Signed)
Patient c/o rash on chest x 3days. States nothing has changed and has tried Benadryl. Patient also c/o chest tightness and anxiety. Patient has a hx of Anxiety states she did her inhaler 30 min ago.

## 2012-09-08 ENCOUNTER — Ambulatory Visit (INDEPENDENT_AMBULATORY_CARE_PROVIDER_SITE_OTHER): Payer: BC Managed Care – PPO | Admitting: Psychiatry

## 2012-09-08 ENCOUNTER — Encounter (HOSPITAL_COMMUNITY): Payer: Self-pay | Admitting: Psychiatry

## 2012-09-08 VITALS — BP 140/98 | Ht 68.0 in | Wt 251.0 lb

## 2012-09-08 DIAGNOSIS — F3289 Other specified depressive episodes: Secondary | ICD-10-CM

## 2012-09-08 DIAGNOSIS — F331 Major depressive disorder, recurrent, moderate: Secondary | ICD-10-CM

## 2012-09-08 DIAGNOSIS — F329 Major depressive disorder, single episode, unspecified: Secondary | ICD-10-CM

## 2012-09-08 DIAGNOSIS — F411 Generalized anxiety disorder: Secondary | ICD-10-CM

## 2012-09-08 NOTE — Progress Notes (Signed)
   [facility] Behavioral Health Follow-up Outpatient Visit  TABBETHA KUTSCHER 03/15/1991   Subjective: The patient is a 21 year old female who has been followed by [facility] since July 2013. She was referred by her neurologist Dr. Loyal Gambler. The patient has a history of pseudotumor cerebri. This has been stable, except for occasional headaches. At her last appointment, I increased her Wellbutrin XL from 150 mg daily to 300 mg daily. She presents today. Her insurance changed. She did not get her Lasix prescription filled for one month while waiting for the new insurance. She did not want her parents to have to pay for it. During that month, her blood pressure has been up and she has been getting more headaches. She has been back on the Lasix now for 2 days. Blood pressure is still elevated today. The patient is doing well with the Wellbutrin XL. She endorses good sleep and appetite. She denies any anxiety or depression. She continues to work. She was told by her manager that she is his favorite employee. The woman she had issues with is still employed there. She's become nicer. The only issue currently with her husband is the fact that he has a water bed. The patient cannot sleep well in it. Her father-in-law promised a new mattress for Christmas. Later it came out that the family was planning a cruise and had not invited them. They have now been given the choice to take a cruise or get the mattress.  Filed Vitals:   09/08/12 1441  BP: 140/98    Mental Status Examination  Appearance: Casually dressed, obese Alert: Yes Attention: good  Cooperative: Yes Eye Contact: Good Speech: Regular rate rhythm and volume Psychomotor Activity: Normal Memory/Concentration: Intact Oriented: person, place, time/date and situation Mood: Euthymic Affect: Blunt Thought Processes and Associations: Goal Directed and Logical Fund of Knowledge: Fair Thought Content: No suicidal or homicidal  thoughts Insight: Fair Judgement: Fair  Diagnosis: Depressive disorder, generalized anxiety disorder  Treatment Plan: I will not make any changes. I will continue the Wellbutrin XL 300 mg daily. I will see the patient back in 3 months. She may call with concerns.  Jamse Mead, MD

## 2012-10-17 ENCOUNTER — Other Ambulatory Visit (HOSPITAL_COMMUNITY): Payer: Self-pay | Admitting: Psychiatry

## 2012-12-08 ENCOUNTER — Ambulatory Visit (INDEPENDENT_AMBULATORY_CARE_PROVIDER_SITE_OTHER): Payer: 59 | Admitting: Psychiatry

## 2012-12-08 ENCOUNTER — Encounter (HOSPITAL_COMMUNITY): Payer: Self-pay | Admitting: Psychiatry

## 2012-12-08 VITALS — BP 138/90 | Ht 68.0 in | Wt 257.0 lb

## 2012-12-08 DIAGNOSIS — F411 Generalized anxiety disorder: Secondary | ICD-10-CM

## 2012-12-08 DIAGNOSIS — F331 Major depressive disorder, recurrent, moderate: Secondary | ICD-10-CM

## 2012-12-08 MED ORDER — SERTRALINE HCL 50 MG PO TABS: ORAL_TABLET | ORAL | Status: DC

## 2012-12-08 NOTE — Progress Notes (Signed)
[facility] [id number] Progress Note  Maria Payne [id number] 21 y.o.  12/08/2012 3:40 PM  Chief Complaint: Med management for depression and anxiety  History of Present Illness:The patient is a 22 year old female who has been followed by [facility] since July 2013. She was referred by her neurologist Dr. Loyal Gambler. The patient has a history of pseudotumor cerebri. This has been stable, except for occasional headaches. The patient has been started on Wellbutrin XL which has been optimized 300 mg daily. At her last appointment, I did not make any changes. She presents today. She has seen her neurologist since last appointment. She is now off her Lasix. Her only medication besides the Wellbutrin XL at the Topamax 100 mg twice a day. According to Dr. Craige Cotta, last exam showed improvement with the optic nerve. The patient been having less headaches. If she gets a headache, it does dissipate with medication. The patient continues to work at [employer]. Her manager called the other day to see if she wanted to get a set schedule. Since her husband is off on Mondays and Thursdays, she asked for these days off. It looks like she will get Tuesdays and Thursdays off. The patient has had worsening anxiety for approximately one month. Her hours were cut at work to 18 hours a week. She was also hospitalized last month for stomach issues. She had a workup done, and they reported to her that it was a bad virus. Her stomach has not been the same. Her dad does have irritable bowel disease. The patient is not sleeping well. She thinks a lot at night. She's had other issues for example with her car. She is very concerned about finances. There no suicidal thoughts. She feels that she is hang in there. The Wellbutrin XL helps with her depression but not the anxiety. The patient and her husband finally got a new bed. They did not go on a cruise with the in-laws over Christmas.  Suicidal Ideation: No  Plan Formed: No Patient has means to carry out plan: No  Homicidal Ideation: No Plan Formed: No Patient has means to carry out plan: No  Review of Systems: Psychiatric: Agitation: No Hallucination: No Depressed Mood: No Insomnia: Yes Hypersomnia: No Altered Concentration: No Feels Worthless: No Grandiose Ideas: No Belief In Special Powers: No New/Increased Substance Abuse: No Compulsions: No  Neurologic: Headache: Yes Seizure: No Paresthesias: No  Past Medical Family, Social History: Patient with a history of pseudotumor cerebrally treated by neurologist Dr. Loyal Gambler. The patient has recently been married. She is now married 5 months. She has no children. Her husband works at [employer].  Outpatient Encounter Prescriptions as of 12/08/2012  Medication Sig Dispense Refill  . acetaZOLAMIDE (DIAMOX) 500 MG capsule Take 500 mg by mouth 2 (two) times daily. Take one in the a.m. And two qhs      . albuterol (PROVENTIL HFA;VENTOLIN HFA) 108 (90 BASE) MCG/ACT inhaler Inhale 2 puffs into the lungs every 4 (four) hours as needed for wheezing.  1 Inhaler  1  . ALBUTEROL IN Inhale into the lungs.        [name] Kitchen amoxicillin (AMOXIL) 875 MG tablet Take 1 tablet (875 mg total) by mouth 2 (two) times daily.  20 tablet  0  . benzonatate (TESSALON) 200 MG capsule Take 1 capsule (200 mg total) by mouth at bedtime. Take as needed for cough  12 capsule  0  . buPROPion (WELLBUTRIN XL) 300 MG 24  hr tablet take 1 tablet by mouth every morning  30 tablet  2  . Furosemide (LASIX PO) Take by mouth daily.      . hydrOXYzine (ATARAX/VISTARIL) 25 MG tablet Take 1 tablet (25 mg total) by mouth every 8 (eight) hours as needed for itching. May cause drowsiness.  20 tablet  0  . predniSONE (DELTASONE) 20 MG tablet Take 1 tablet (20 mg total) by mouth 2 (two) times daily. Take with food.  10 tablet  0  . sertraline (ZOLOFT) 50 MG tablet Take 1/2 daily for two weeks then increase to one daily  30 tablet  2  .  Topiramate (TOPAMAX PO) Take by mouth 2 (two) times daily.       No facility-administered encounter medications on file as of 12/08/2012.    Past Psychiatric History/Hospitalization(s): Anxiety: Yes Bipolar Disorder: No Depression: Yes Mania: No Psychosis: No Schizophrenia: No Personality Disorder: No Hospitalization for psychiatric illness: No History of Electroconvulsive Shock Therapy: No Prior Suicide Attempts: No  Physical Exam: Constitutional:  BP 138/90  Ht 5\' 8"  (1.727 m)  Wt 257 lb (116.574 kg)  BMI 39.09 kg/m2  General Appearance: alert, oriented, no acute distress and obese  Musculoskeletal: Strength & Muscle Tone: within normal limits Gait & Station: normal Patient leans: N/A  Psychiatric: Speech (describe rate, volume, coherence, spontaneity, and abnormalities if any):  Speech is regular rate rhythm and volume. Coherence is normal. Patient has spontaneity of speech.  Thought Process (describe rate, content, abstract reasoning, and computation): Thought processes logical and goal oriented. She is able to abstract reason and computer.  Associations: Coherent and Relevant  Thoughts: normal  Mental Status: Orientation: oriented to person, place, time/date and situation Mood & Affect: normal affect Attention Span & Concentration: Within normal limits  Medical Decision Making (Choose Three): Established Problem, Worsening (2), Review of Medication Regimen & Side Effects (2) and Review of New Medication or Change in Dosage (2)  Assessment: Axis I: Maj. depressive disorder, recurrent, moderate; generalized anxiety disorder    Plan:  I will add Zoloft 25 mg daily x2 weeks then increase to 50 mg daily to the medication regimen. I will continue the Wellbutrin XL 300 mg daily. Patient is to continue the Topamax 100 mg twice a day through neurology. I will see the patient back in 6 weeks. Patient may call with any concerns.  Jamse Mead,  MD 12/08/2012

## 2013-01-20 ENCOUNTER — Ambulatory Visit (HOSPITAL_COMMUNITY): Payer: Self-pay | Admitting: Psychiatry

## 2013-01-22 ENCOUNTER — Ambulatory Visit (INDEPENDENT_AMBULATORY_CARE_PROVIDER_SITE_OTHER): Payer: 59 | Admitting: Psychiatry

## 2013-01-22 ENCOUNTER — Encounter (HOSPITAL_COMMUNITY): Payer: Self-pay | Admitting: Psychiatry

## 2013-01-22 VITALS — BP 122/78 | Ht 68.0 in | Wt 262.0 lb

## 2013-01-22 DIAGNOSIS — F329 Major depressive disorder, single episode, unspecified: Secondary | ICD-10-CM

## 2013-01-22 DIAGNOSIS — F3289 Other specified depressive episodes: Secondary | ICD-10-CM

## 2013-01-22 DIAGNOSIS — F331 Major depressive disorder, recurrent, moderate: Secondary | ICD-10-CM

## 2013-01-22 DIAGNOSIS — F411 Generalized anxiety disorder: Secondary | ICD-10-CM

## 2013-01-22 MED ORDER — BUPROPION HCL ER (XL) 300 MG PO TB24: ORAL_TABLET | ORAL | Status: DC

## 2013-01-22 NOTE — Progress Notes (Deleted)
[facility] [id number] Progress Note  Maria Payne [id number] 21 y.o.  01/22/2013 1:27 PM  Chief Complaint: Med management for depression and anxiety  History of Present Illness:The patient is a 22 year old female who has been followed by [facility] since July 2013. She was referred by her neurologist Dr. Loyal Gambler. The patient has a history of pseudotumor cerebri. This has been stable, except for occasional headaches. The patient has been started on Wellbutrin XL which has been optimized 300 mg daily. At her last appointment, I did not make any changes. She presents today. She has seen her neurologist since last appointment. She is now off her Lasix. Her only medication besides the Wellbutrin XL at the Topamax 100 mg twice a day. According to Dr. Craige Cotta, last exam showed improvement with the optic nerve. The patient been having less headaches. If she gets a headache, it does dissipate with medication. The patient continues to work at [employer]. Her manager called the other day to see if she wanted to get a set schedule. Since her husband is off on Mondays and Thursdays, she asked for these days off. It looks like she will get Tuesdays and Thursdays off. The patient has had worsening anxiety for approximately one month. Her hours were cut at work to 18 hours a week. She was also hospitalized last month for stomach issues. She had a workup done, and they reported to her that it was a bad virus. Her stomach has not been the same. Her dad does have irritable bowel disease. The patient is not sleeping well. She thinks a lot at night. She's had other issues for example with her car. She is very concerned about finances. There no suicidal thoughts. She feels that she is hang in there. The Wellbutrin XL helps with her depression but not the anxiety. The patient and her husband finally got a new bed. They did not go on a cruise with the in-laws over Christmas.  Suicidal Ideation: No  Plan Formed: No Patient has means to carry out plan: No  Homicidal Ideation: No Plan Formed: No Patient has means to carry out plan: No  Review of Systems: Psychiatric: Agitation: No Hallucination: No Depressed Mood: No Insomnia: Yes Hypersomnia: No Altered Concentration: No Feels Worthless: No Grandiose Ideas: No Belief In Special Powers: No New/Increased Substance Abuse: No Compulsions: No  Neurologic: Headache: Yes Seizure: No Paresthesias: No  Past Medical Family, Social History: Patient with a history of pseudotumor cerebrally treated by neurologist Dr. Loyal Gambler. The patient has recently been married. She is now married 5 months. She has no children. Her husband works at [employer].  Outpatient Encounter Prescriptions as of 01/22/2013  Medication Sig Dispense Refill  . acetaZOLAMIDE (DIAMOX) 500 MG capsule Take 500 mg by mouth 2 (two) times daily. Take one in the a.m. And two qhs      . albuterol (PROVENTIL HFA;VENTOLIN HFA) 108 (90 BASE) MCG/ACT inhaler Inhale 2 puffs into the lungs every 4 (four) hours as needed for wheezing.  1 Inhaler  1  . ALBUTEROL IN Inhale into the lungs.        [name] Kitchen amoxicillin (AMOXIL) 875 MG tablet Take 1 tablet (875 mg total) by mouth 2 (two) times daily.  20 tablet  0  . benzonatate (TESSALON) 200 MG capsule Take 1 capsule (200 mg total) by mouth at bedtime. Take as needed for cough  12 capsule  0  . buPROPion (WELLBUTRIN XL) 300 MG 24  hr tablet take 1 tablet by mouth every morning  30 tablet  2  . Furosemide (LASIX PO) Take by mouth daily.      . hydrOXYzine (ATARAX/VISTARIL) 25 MG tablet Take 1 tablet (25 mg total) by mouth every 8 (eight) hours as needed for itching. May cause drowsiness.  20 tablet  0  . predniSONE (DELTASONE) 20 MG tablet Take 1 tablet (20 mg total) by mouth 2 (two) times daily. Take with food.  10 tablet  0  . sertraline (ZOLOFT) 50 MG tablet Take 1/2 daily for two weeks then increase to one daily  30 tablet  2  .  Topiramate (TOPAMAX PO) Take by mouth 2 (two) times daily.      . [DISCONTINUED] buPROPion (WELLBUTRIN XL) 300 MG 24 hr tablet take 1 tablet by mouth every morning  30 tablet  2   No facility-administered encounter medications on file as of 01/22/2013.    Past Psychiatric History/Hospitalization(s): Anxiety: Yes Bipolar Disorder: No Depression: Yes Mania: No Psychosis: No Schizophrenia: No Personality Disorder: No Hospitalization for psychiatric illness: No History of Electroconvulsive Shock Therapy: No Prior Suicide Attempts: No  Physical Exam: Constitutional:  BP 122/78  Ht 5\' 8"  (1.727 m)  Wt 262 lb (118.842 kg)  BMI 39.85 kg/m2  General Appearance: alert, oriented, no acute distress and obese  Musculoskeletal: Strength & Muscle Tone: within normal limits Gait & Station: normal Patient leans: N/A  Psychiatric: Speech (describe rate, volume, coherence, spontaneity, and abnormalities if any):  Speech is regular rate rhythm and volume. Coherence is normal. Patient has spontaneity of speech.  Thought Process (describe rate, content, abstract reasoning, and computation): Thought processes logical and goal oriented. She is able to abstract reason and computer.  Associations: Coherent and Relevant  Thoughts: normal  Mental Status: Orientation: oriented to person, place, time/date and situation Mood & Affect: normal affect Attention Span & Concentration: Within normal limits  Medical Decision Making (Choose Three): Established Problem, Worsening (2), Review of Medication Regimen & Side Effects (2) and Review of New Medication or Change in Dosage (2)  Assessment: Axis I: Maj. depressive disorder, recurrent, moderate; generalized anxiety disorder    Plan:  I will add Zoloft 25 mg daily x2 weeks then increase to 50 mg daily to the medication regimen. I will continue the Wellbutrin XL 300 mg daily. Patient is to continue the Topamax 100 mg twice a day through neurology. I  will see the patient back in 6 weeks. Patient may call with any concerns.  Jamse Mead, MD 01/22/2013

## 2013-01-22 NOTE — Progress Notes (Signed)
[facility] Follow-up Outpatient Visit  Maria Payne 1991/03/10   Subjective: The patient is a 22 year old female who has been followed by [facility] since July 2013. She was referred by her neurologist Dr. Loyal Gambler. The patient has a history of pseudotumor cerebri. This has been stable, except for occasional headaches. At her last appointment, I continued her Wellbutrin XL and started her on Zoloft to do with symptoms of anxiety. She presents today. She is actually 15 minutes late- she thought the appointment was later in the day. She is doing much better with her anxiety. She stressing out much less. She has not seen Dr. Craige Cotta since her last appointment. She still not sleeping as well as she was since she change beds. She will take Benadryl for sleep. Her husband has been promoted, and is working down the road. They had to stay soft together. The patient is starting a new exercise and weight loss program in [place] along with her parents. Her depression is under control. She is now working 36 hours a week. She's happy about this. She has no other complaints today. She's had no headaches. Her pseudotumor cerebra continues to be monitored by neurology.  Filed Vitals:   01/22/13 1326  BP: 122/78   Active Ambulatory Problems    Diagnosis Date Noted  . DERMATITIS, PERIORAL 05/17/2010  . 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 Pseudotumor cerebri 09/10/2011  . MDD (major depressive disorder) 04/04/2012  . GAD (generalized anxiety disorder) 04/04/2012   Resolved Ambulatory Problems    Diagnosis Date Noted  . FOOT PAIN 09/25/2010  . Acute maxillary sinusitis 10/20/2010   No Additional Past Medical History   Current Outpatient Prescriptions on File Prior to Visit  Medication Sig Dispense Refill  . acetaZOLAMIDE (DIAMOX) 500 MG capsule Take 500 mg by mouth 2 (two) times daily. Take one in the a.m. And two qhs      . albuterol (PROVENTIL  HFA;VENTOLIN HFA) 108 (90 BASE) MCG/ACT inhaler Inhale 2 puffs into the lungs every 4 (four) hours as needed for wheezing.  1 Inhaler  1  . ALBUTEROL IN Inhale into the lungs.        [name] Kitchen amoxicillin (AMOXIL) 875 MG tablet Take 1 tablet (875 mg total) by mouth 2 (two) times daily.  20 tablet  0  . benzonatate (TESSALON) 200 MG capsule Take 1 capsule (200 mg total) by mouth at bedtime. Take as needed for cough  12 capsule  0  . Furosemide (LASIX PO) Take by mouth daily.      . hydrOXYzine (ATARAX/VISTARIL) 25 MG tablet Take 1 tablet (25 mg total) by mouth every 8 (eight) hours as needed for itching. May cause drowsiness.  20 tablet  0  . predniSONE (DELTASONE) 20 MG tablet Take 1 tablet (20 mg total) by mouth 2 (two) times daily. Take with food.  10 tablet  0  . sertraline (ZOLOFT) 50 MG tablet Take 1/2 daily for two weeks then increase to one daily  30 tablet  2  . Topiramate (TOPAMAX PO) Take by mouth 2 (two) times daily.       No current facility-administered medications on file prior to visit.   Review of Systems - General ROS: negative for - sleep disturbance or weight loss Psychological ROS: negative for - anxiety or depression Cardiovascular ROS: no chest pain or dyspnea on exertion Musculoskeletal ROS: negative for - gait disturbance or muscular weakness Neurological ROS: negative for - dizziness,  headaches or seizures   Mental Status Examination  Appearance: Casually dressed, obese Alert: Yes Attention: good  Cooperative: Yes Eye Contact: Good Speech: Regular rate rhythm and volume Psychomotor Activity: Normal Memory/Concentration: Intact Oriented: person, place, time/date and situation Mood: Euthymic Affect: Blunt Thought Processes and Associations: Goal Directed and Logical Fund of Knowledge: Fair Thought Content: No suicidal or homicidal thoughts Insight: Fair Judgement: Fair  Diagnosis: Depressive disorder, generalized anxiety disorder  Treatment Plan: I will not make  any changes. I will continue the Wellbutrin XL 300 mg daily and Zoloft 50 mg daily.. I will see the patient back in 3 months. She may call with concerns.  Jamse Mead, MD

## 2013-01-28 ENCOUNTER — Ambulatory Visit (INDEPENDENT_AMBULATORY_CARE_PROVIDER_SITE_OTHER): Payer: Managed Care, Other (non HMO)

## 2013-01-28 ENCOUNTER — Encounter: Payer: Self-pay | Admitting: Family Medicine

## 2013-01-28 ENCOUNTER — Ambulatory Visit (INDEPENDENT_AMBULATORY_CARE_PROVIDER_SITE_OTHER): Payer: Managed Care, Other (non HMO) | Admitting: Family Medicine

## 2013-01-28 VITALS — BP 128/79 | HR 98 | Wt 222.0 lb

## 2013-01-28 DIAGNOSIS — W108XXA Fall (on) (from) other stairs and steps, initial encounter: Secondary | ICD-10-CM

## 2013-01-28 DIAGNOSIS — S93401A Sprain of unspecified ligament of right ankle, initial encounter: Secondary | ICD-10-CM

## 2013-01-28 DIAGNOSIS — W109XXA Fall (on) (from) unspecified stairs and steps, initial encounter: Secondary | ICD-10-CM

## 2013-01-28 DIAGNOSIS — M25571 Pain in right ankle and joints of right foot: Secondary | ICD-10-CM

## 2013-01-28 DIAGNOSIS — M25579 Pain in unspecified ankle and joints of unspecified foot: Secondary | ICD-10-CM

## 2013-01-28 DIAGNOSIS — M79671 Pain in right foot: Secondary | ICD-10-CM

## 2013-01-28 DIAGNOSIS — M79609 Pain in unspecified limb: Secondary | ICD-10-CM

## 2013-01-28 DIAGNOSIS — S93409A Sprain of unspecified ligament of unspecified ankle, initial encounter: Secondary | ICD-10-CM

## 2013-01-28 NOTE — Progress Notes (Signed)
  Subjective:    Patient ID: Maria Payne, female    DOB: 1990-12-28, 22 y.o.   MRN: [id number]  HPI Last night was walking and texting and missed the last step.  Right ankle twisted and went under her.  Pain is worse mediall. Had hard time putting pressure on it and moving toes initally. Hard to move them today bc of pain. Did ice it and and put heating pad ont it.  Took some IBU - no relief.  Can walk on it a little today. Hard to put weight on her toes.     Review of Systems     Objective:   Physical Exam  Musculoskeletal:   Right ankle - she is very tender over the medial malleolus and the lateral malleolus and along the posterior border of the malleolus. She has some significant 1+ edema over the lateral ankle. She's also very tender over the first, distal metatarsal. She has significant pain when I try to move her big toe.  Unable to move the rest of her toes but she is not able to flex and extend them by herself secondary to pain and discomfort.           Assessment & Plan:  Right ankle pain - am very concerned about fracture. She is very tender over the medial malleolus and the lateral malleolus and along the posterior border of the malleolus. She has some significant 1+ edema over the lateral ankle. She's also very tender over the first, distal metatarsal. She has significant pain when I try to move her big toe.  Unable to move the rest of her toes but she is not able to flex and extend them by herself secondary to pain and discomfort. We'll go ahead and place her on crutches. She will come back up after her x-ray to the results.  X-ray is negative for fracture. Ace bandage place and recommended therapy includes  PRICE.  Reviewed this with her. I like to see her back in one week to make sure that she's improving and see if she's able to bear some weight on the ankle at that point in time. After the next 2 days I would like her to start working on some gentle range of motion of the  ankle. Instructed her on how to do this. Also recommended taking an anti-inflammatory with food and water and avoid any GI discomfort or upset.

## 2013-01-28 NOTE — Patient Instructions (Signed)

## 2013-02-05 ENCOUNTER — Encounter: Payer: Self-pay | Admitting: Family Medicine

## 2013-02-05 ENCOUNTER — Ambulatory Visit (INDEPENDENT_AMBULATORY_CARE_PROVIDER_SITE_OTHER): Payer: Managed Care, Other (non HMO) | Admitting: Family Medicine

## 2013-02-05 ENCOUNTER — Ambulatory Visit (INDEPENDENT_AMBULATORY_CARE_PROVIDER_SITE_OTHER): Payer: Managed Care, Other (non HMO)

## 2013-02-05 VITALS — BP 132/78 | HR 109 | Wt 222.0 lb

## 2013-02-05 DIAGNOSIS — M25571 Pain in right ankle and joints of right foot: Secondary | ICD-10-CM

## 2013-02-05 DIAGNOSIS — M79671 Pain in right foot: Secondary | ICD-10-CM

## 2013-02-05 DIAGNOSIS — M79609 Pain in unspecified limb: Secondary | ICD-10-CM

## 2013-02-05 DIAGNOSIS — S93409A Sprain of unspecified ligament of unspecified ankle, initial encounter: Secondary | ICD-10-CM

## 2013-02-05 DIAGNOSIS — X500XXA Overexertion from strenuous movement or load, initial encounter: Secondary | ICD-10-CM

## 2013-02-05 DIAGNOSIS — M25579 Pain in unspecified ankle and joints of unspecified foot: Secondary | ICD-10-CM

## 2013-02-05 NOTE — Progress Notes (Signed)
  Subjective:    Patient ID: Maria Payne, female    DOB: 20-Apr-1991, 22 y.o.   MRN: [id number]  HPI F/U Right ankle sprain. She trying put weight on it yesterday adn today and still really painful to put weight on it. Still really swollen. Using her crutches. Unable to still really bear weight especially on the heel but she can put some weight on the toes. She tried to do the ABC stretches that I gave her and says she really just couldn't do it. It was too painful. She just to keep it propped up at work but has to sit up on a stool and a says it's still fairly uncomfortable. 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   Review of Systems     Objective:   Physical Exam  Musculoskeletal:  Right foot is still very swollen laterally from the heel all way down to the third fourth and fifth digits. She still has significant bruising there. She is very tender over the lateral malleolus as well as along the fourth and fifth metatarsals. She does have some increased laxity with anterior drawer of the ankle. She does have decreased range of motion. She's able to flex extend and move side to side but only about 5-10 in each direction.          Assessment & Plan:  Right ankle strain-she still has a lot of swelling and tenderness on exam. She still unable to bear weight so like to repeat her x-ray today. She will likely need to continue to use the crutches for about 2 more weeks. She's asking if she could use a boot and stay because of crutches or so or at work. I also offered to write her out of work for 5 days to try to keep it elevated and completely stay off of it as much as possible. She says she really needs to be able to work. We could certainly try a Cam Walker, but it's too uncomfortable then she will need to use the crutches.

## 2013-04-10 HISTORY — PX: OTHER SURGICAL HISTORY: SHX169

## 2013-04-23 ENCOUNTER — Ambulatory Visit (HOSPITAL_COMMUNITY): Payer: Self-pay | Admitting: Psychiatry

## 2013-04-24 ENCOUNTER — Encounter: Payer: Self-pay | Admitting: Family Medicine

## 2013-06-10 ENCOUNTER — Ambulatory Visit (INDEPENDENT_AMBULATORY_CARE_PROVIDER_SITE_OTHER): Payer: 59 | Admitting: Psychiatry

## 2013-06-10 ENCOUNTER — Encounter (HOSPITAL_COMMUNITY): Payer: Self-pay | Admitting: Psychiatry

## 2013-06-10 VITALS — BP 138/84 | Ht 68.0 in | Wt 270.0 lb

## 2013-06-10 DIAGNOSIS — F331 Major depressive disorder, recurrent, moderate: Secondary | ICD-10-CM

## 2013-06-10 DIAGNOSIS — F411 Generalized anxiety disorder: Secondary | ICD-10-CM

## 2013-06-10 DIAGNOSIS — F3289 Other specified depressive episodes: Secondary | ICD-10-CM

## 2013-06-10 DIAGNOSIS — F329 Major depressive disorder, single episode, unspecified: Secondary | ICD-10-CM

## 2013-06-10 MED ORDER — ESCITALOPRAM OXALATE 10 MG PO TABS: ORAL_TABLET | ORAL | Status: DC

## 2013-06-10 NOTE — Progress Notes (Signed)
   [facility] Follow-up Outpatient Visit  Maria Payne 1991-03-21   Subjective: The patient is a 22 year old female who has been followed by [facility] since July 2013. She was referred by her neurologist Dr. Loyal Gambler. The patient has a history of pseudotumor cerebri.  Patient is currently diagnosed with Maj. depressive disorder along with generalized anxiety disorder. At her last appointment, I continued her Wellbutrin XL and Zoloft. Patient reports multiple changes since her last appointment. There was an episode in July or she lost vision when she was at work. She was rushed to the emergency department. She had increased CSF pressure. She actually had brain surgery 2 months ago and had 2 stents placed. She reports a since the surgery, her vision has gotten worse. She is now wearing glasses. The patient is been out of work since July. Secondary to finances she and her husband had to move in with the patient's parents. They do have their own space in the house. The patient reports that she's been more depressed. She's not sleeping well. Her neurosurgeon wants her off the Zoloft. He feels that is contributing to weight gain. The patient is asking for a substitution. There is no suicidal thoughts.  Filed Vitals:   06/10/13 1441  BP: 138/84   Active Ambulatory Problems    Diagnosis Date Noted  . DERMATITIS, PERIORAL 05/17/2010  . 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 Pseudotumor cerebri 09/10/2011  . MDD (major depressive disorder) 04/04/2012  . GAD (generalized anxiety disorder) 04/04/2012   Resolved Ambulatory Problems    Diagnosis Date Noted  . FOOT PAIN 09/25/2010  . Acute maxillary sinusitis 10/20/2010   Past Medical History  Diagnosis Date  . Papilledema    Current Outpatient Prescriptions on File Prior to Visit  Medication Sig Dispense Refill  . albuterol (PROVENTIL HFA;VENTOLIN HFA) 108 (90 BASE) MCG/ACT inhaler Inhale 2 puffs into  the lungs every 4 (four) hours as needed for wheezing.  1 Inhaler  1  . buPROPion (WELLBUTRIN XL) 300 MG 24 hr tablet take 1 tablet by mouth every morning  30 tablet  2   No current facility-administered medications on file prior to visit.   Review of Systems - General ROS: negative for - sleep disturbance or weight loss Psychological ROS: negative for - anxiety or depression Cardiovascular ROS: no chest pain or dyspnea on exertion Musculoskeletal ROS: negative for - gait disturbance or muscular weakness Neurological ROS: negative for - dizziness, headaches or seizures   Mental Status Examination  Appearance: Casually dressed, obese Alert: Yes Attention: good  Cooperative: Yes Eye Contact: Good Speech: Regular rate rhythm and volume Psychomotor Activity: Normal Memory/Concentration: Intact Oriented: person, place, time/date and situation Mood: Euthymic Affect: Blunt Thought Processes and Associations: Goal Directed and Logical Fund of Knowledge: Fair Thought Content: No suicidal or homicidal thoughts Insight: Fair Judgement: Fair  Diagnosis: Depressive disorder, generalized anxiety disorder  Treatment Plan: I will discontinue the Zoloft. I will start Lexapro at 5 mg daily for 2 weeks then increase to 10 mg daily. I will continue the Wellbutrin XL 300 mg daily. I will see the patient back in one month. Patient will transition care to Dr. Laury Deep.  Jamse Mead, MD

## 2013-07-04 ENCOUNTER — Encounter: Payer: Self-pay | Admitting: Emergency Medicine

## 2013-07-04 ENCOUNTER — Emergency Department
Admission: EM | Admit: 2013-07-04 | Discharge: 2013-07-04 | Disposition: A | Discharge status: Home / Self Care | Payer: Managed Care, Other (non HMO) | Source: Home / Self Care | Attending: Family Medicine | Admitting: Family Medicine

## 2013-07-04 DIAGNOSIS — J069 Acute upper respiratory infection, unspecified: Secondary | ICD-10-CM

## 2013-07-04 DIAGNOSIS — J029 Acute pharyngitis, unspecified: Secondary | ICD-10-CM

## 2013-07-04 LAB — POCT RAPID STREP A (OFFICE): Rapid Strep A Screen: NEGATIVE

## 2013-07-04 MED ORDER — AMOXICILLIN 500 MG PO CAPS: 500.0000 mg | ORAL_CAPSULE | Freq: Three times a day (TID) | ORAL | Status: DC

## 2013-07-04 MED ORDER — BENZONATATE 200 MG PO CAPS: 200.0000 mg | ORAL_CAPSULE | Freq: Every day | ORAL | Status: DC

## 2013-07-04 NOTE — ED Provider Notes (Signed)
CSN: [id number]     Arrival date & time 07/04/13  1206 History   First MD Initiated Contact with Patient 07/04/13 1236     Chief Complaint  Patient presents with  . Sore Throat  . Otalgia  . Nasal Congestion  . Hoarse  . Generalized Body Aches  . Fever      HPI Comments: Patient developed a mild sore throat 5 days ago, followed by nasal congestion, and a cough two days ago.  Her cough is nonproductive and worse at night.  She has been fatigued with a fever to 100.  Her ears feel clogged.  She notes that her albuterol inhaler has been somewhat helpful at night, although normally she never uses it.  The history is provided by the patient.    Past Medical History  Diagnosis Date  . Pseudotumor cerebri   . Papilledema     Diamox   Past Surgical History  Procedure Laterality Date  . Angioplasty  2012    ? cerebral artery   . Stent placed  August 2014    Stenosis right transverse sigmoid junction,[facility]   Family History  Problem Relation Age of Onset  . Hypertension Father   . Heart disease Father    History  Substance Use Topics  . Smoking status: Never Smoker   . Smokeless tobacco: Not on file  . Alcohol Use: Yes     Comment: occ   OB History   Grav Para Term Preterm Abortions TAB SAB Ect Mult Living                 Review of Systems + sore throat + cough No pleuritic pain + wheezing + nasal congestion + post-nasal drainage + sinus pain/pressure No itchy/red eyes ? earache No hemoptysis + SOB + fever, + chills No nausea No vomiting No abdominal pain No diarrhea No urinary symptoms No skin rashes + fatigue No myalgias + headache Used OTC meds without relief  Allergies  Codeine  Home Medications   Current Outpatient Rx  Name  Route  Sig  Dispense  Refill  . acetaZOLAMIDE (DIAMOX) 500 MG capsule               . albuterol (PROVENTIL HFA;VENTOLIN HFA) 108 (90 BASE) MCG/ACT inhaler   Inhalation   Inhale 2 puffs into the lungs every 4 (four)  hours as needed for wheezing.   1 Inhaler   1   . amoxicillin (AMOXIL) 500 MG capsule   Oral   Take 1 capsule (500 mg total) by mouth 3 (three) times daily. (Rx void after 07/12/13)   30 capsule   0   . baclofen (LIORESAL) 10 MG tablet               . benzonatate (TESSALON) 200 MG capsule   Oral   Take 1 capsule (200 mg total) by mouth at bedtime. Take as needed for cough   12 capsule   0   . buPROPion (WELLBUTRIN XL) 300 MG 24 hr tablet      take 1 tablet by mouth every morning   30 tablet   2   . butalbital-acetaminophen-caffeine (FIORICET, ESGIC) 50-325-40 MG per tablet               . clopidogrel (PLAVIX) 75 MG tablet               . escitalopram (LEXAPRO) 10 MG tablet      Take 1/2 daily for two weeks then  increase to one daily   30 tablet   2   . furosemide (LASIX) 20 MG tablet               . topiramate (TOPAMAX) 100 MG tablet                BP 116/80  Pulse 88  Temp(Src) 97.8 F (36.6 C) (Oral)  Resp 18  Ht 5\' 8"  (1.727 m)  Wt 238 lb (107.956 kg)  BMI 36.2 kg/m2  SpO2 99% Physical Exam Nursing notes and Vital Signs reviewed. Appearance:  Patient appears stated age, and in no acute distress.  Patient is obese (BMI 36.2) Eyes:  Pupils are equal, round, and reactive to light and accomodation.  Extraocular movement is intact.  Conjunctivae are not inflamed  Ears:  Canals normal.  Tympanic membranes normal.  Nose:  Mildly congested turbinates.  No sinus tenderness.    Pharynx:  Normal Neck:  Supple.   Tender shotty posterior nodes are palpated bilaterally  Lungs:  Clear to auscultation.  Breath sounds are equal.  Heart:  Regular rate and rhythm without murmurs, rubs, or gallops.  Abdomen:  Nontender without masses or hepatosplenomegaly.  Bowel sounds are present.  No CVA or flank tenderness.  Extremities:  No edema.  No calf tenderness Skin:  No rash present.   ED Course  Procedures  none    Labs Reviewed  POCT RAPID STREP A  (OFFICE) negative         MDM   1. Acute upper respiratory infections of unspecified site; suspect viral URI    There is no evidence of bacterial infection today.   Treat symptomatically for now.  Prescription written for Benzonatate [facility]) to take at bedtime for night-time cough.  Take plain Mucinex (guaifenesin) twice daily for cough and congestion.  Increase fluid intake, rest.  Recommend using saline nasal spray several times daily and saline nasal irrigation (AYR is a common brand) Stop all antihistamines for now, and other non-prescription cough/cold preparations. May take Tylenol for fever, sore throat, headache, etc Continue albuterol inhaler as needed.. Begin Amoxicillin if not improving about 5 days or if persistent fever develops (Given a prescription to hold, with an expiration date)  Follow-up with family doctor if not improving 7 to 10 days.     Lattie Haw, MD 07/05/13 2106

## 2013-07-04 NOTE — ED Notes (Signed)
Reports 5 day history of congestion, sore throat, fatigue, cough, rib pain, ear pain. Does not get Flu vaccination. Fever never >100. No OTCs this a.m.

## 2013-07-05 LAB — STREP A DNA PROBE: GASP: NEGATIVE

## 2013-07-13 ENCOUNTER — Telehealth: Payer: Self-pay | Admitting: Emergency Medicine

## 2013-09-11 ENCOUNTER — Encounter: Payer: Self-pay | Admitting: Emergency Medicine

## 2013-09-11 ENCOUNTER — Emergency Department (INDEPENDENT_AMBULATORY_CARE_PROVIDER_SITE_OTHER)
Admission: EM | Admit: 2013-09-11 | Discharge: 2013-09-11 | Payer: BC Managed Care – PPO | Source: Home / Self Care | Attending: Emergency Medicine | Admitting: Emergency Medicine

## 2013-09-11 DIAGNOSIS — R109 Unspecified abdominal pain: Secondary | ICD-10-CM

## 2013-09-11 LAB — POCT URINALYSIS DIP (MANUAL ENTRY)
Bilirubin, UA: NEGATIVE
Glucose, UA: NEGATIVE
Ketones, POC UA: NEGATIVE
Nitrite, UA: NEGATIVE
Protein Ur, POC: NEGATIVE
Spec Grav, UA: 1.025 (ref 1.005–1.03)
Urobilinogen, UA: 0.2 (ref 0–1)
pH, UA: 7 (ref 5–8)

## 2013-09-11 LAB — POCT CBC W AUTO DIFF (K'VILLE URGENT CARE)

## 2013-09-11 LAB — POCT URINE PREGNANCY: Preg Test, Ur: NEGATIVE

## 2013-09-11 NOTE — ED Notes (Addendum)
Spoke to [employer], Camera operator, at [facility] ED advised her of the pt's condition and that that the pt was in route there via private vehicle. Order for Quanitive HCG cancelled.

## 2013-09-11 NOTE — ED Notes (Signed)
Pt c/o RLQ abd pain worse with urination or BM, some blood in her stool which she has had before, diarrhea, and LBP x 1 wk. Denies fever.

## 2013-09-11 NOTE — ED Provider Notes (Signed)
CSN: [id number]     Arrival date & time 09/11/13  1741 History   First MD Initiated Contact with Patient 09/11/13 1811     Chief Complaint  Patient presents with  . Abdominal Pain   (Consider location/radiation/quality/duration/timing/severity/associated sxs/prior Treatment) HPI RLQ pain for 1 week.  Worse with urination and BM, but can occur between.  + Blood in stool (which has also happened before along with hematemesis) but worse this week.  Personal history of IBS vs IBD.  FHx of kidney stones & ovarian cyst & IBD.  Late period by 2 weeks.  Not sexually active for about a month.  Lots of stress currently in relationship, but doubts exposure to STDs.  No F/C, but has current nausea, diarrhea.  Past Medical History  Diagnosis Date  . Pseudotumor cerebri   . Papilledema     Diamox   Past Surgical History  Procedure Laterality Date  . Angioplasty  2012    ? cerebral artery   . Stent placed  August 2014    Stenosis right transverse sigmoid junction,[facility]   Family History  Problem Relation Age of Onset  . Hypertension Father   . Heart disease Father    History  Substance Use Topics  . Smoking status: Never Smoker   . Smokeless tobacco: Not on file  . Alcohol Use: Yes     Comment: occ   OB History   Grav Para Term Preterm Abortions TAB SAB Ect Mult Living                 Review of Systems  All other systems reviewed and are negative.    Allergies  Codeine  Home Medications   Current Outpatient Rx  Name  Route  Sig  Dispense  Refill  . aspirin 325 MG tablet   Oral   Take 325 mg by mouth daily.         [name] Kitchen acetaZOLAMIDE (DIAMOX) 500 MG capsule               . EXPIRED: albuterol (PROVENTIL HFA;VENTOLIN HFA) 108 (90 BASE) MCG/ACT inhaler   Inhalation   Inhale 2 puffs into the lungs every 4 (four) hours as needed for wheezing.   1 Inhaler   1   . amoxicillin (AMOXIL) 500 MG capsule   Oral   Take 1 capsule (500 mg total) by mouth 3 (three) times daily. (Rx  void after 07/12/13)   30 capsule   0   . baclofen (LIORESAL) 10 MG tablet               . benzonatate (TESSALON) 200 MG capsule   Oral   Take 1 capsule (200 mg total) by mouth at bedtime. Take as needed for cough   12 capsule   0   . buPROPion (WELLBUTRIN XL) 300 MG 24 hr tablet      take 1 tablet by mouth every morning   30 tablet   2   . butalbital-acetaminophen-caffeine (FIORICET, ESGIC) 50-325-40 MG per tablet               . clopidogrel (PLAVIX) 75 MG tablet               . escitalopram (LEXAPRO) 10 MG tablet      Take 1/2 daily for two weeks then increase to one daily   30 tablet   2   . furosemide (LASIX) 20 MG tablet               .  topiramate (TOPAMAX) 100 MG tablet                BP 124/84  Pulse 101  Temp(Src) 98.3 F (36.8 C) (Oral)  Resp 18  Ht 5\' 8"  (1.727 m)  Wt 270 lb (122.471 kg)  BMI 41.06 kg/m2  SpO2 98%  LMP 07/29/2013 Physical Exam  Nursing note and vitals reviewed. Constitutional: She is oriented to person, place, and time. She appears well-developed and well-nourished. She is cooperative.  Non-toxic appearance.  Slightly tachycardic  HENT:  Head: Normocephalic and atraumatic.  Nose: Nose normal.  Mouth/Throat: Uvula is midline and oropharynx is clear and moist.  Eyes: No scleral icterus.  Neck: Neck supple.  Cardiovascular: Normal rate, regular rhythm and normal heart sounds.   Pulmonary/Chest: Effort normal and breath sounds normal. No respiratory distress. She has no decreased breath sounds. She has no wheezes. She has no rhonchi.  Abdominal: Soft. Normal appearance and bowel sounds are normal. There is tenderness in the right lower quadrant and suprapubic area. There is CVA tenderness (right) and tenderness at McBurney's point. There is no rigidity, no rebound, no guarding and negative Murphy's sign.  Genitourinary:  Deferred since sending to ER so that they can do labs as they see fit  Neurological: She is alert  and oriented to person, place, and time.  Skin: Skin is warm and dry.  Psychiatric: She has a normal mood and affect. Her speech is normal and behavior is normal.    ED Course  Procedures (including critical care time) Labs Review Labs Reviewed  URINE CULTURE  HCG, QUANTITATIVE, PREGNANCY  POCT URINALYSIS DIP (MANUAL ENTRY)  POCT CBC W AUTO DIFF ([facility])     EKG Interpretation    Date/Time:    Ventricular Rate:    PR Interval:    QRS Duration:   QT Interval:    QTC Calculation:   R Axis:     Text Interpretation:              MDM   1. Abdominal pain, unspecified site    Due to multiple risk factors... Acute RLQ quadrant pain, missed period, history of IBS vs IBD, blood in stool... will send to ER for further evaluation and likely will need CT scan and/or Ultrasound to narrow down differential.  Explained DDx to patient including appendicitis, ectopic, IBD, PID, stone.  UA essentially normal with trace blood & trace leuks, CBC normal.  UCx pending.  I don't believe that she requires transport as she appears currently very stable.  However, since it is Friday night and the workup is beyond our capabilities, she is being sent to ER.  We called [place] who suggested sending to [facility] ER in case there's a further issue that needs to be managed acutely.  Janeann Forehand, MD 09/11/13 1850

## 2013-09-11 NOTE — ED Notes (Signed)
Spoke to [employer] charge nurse at [facility] ED she advised to send pt to [facility] ED since [place] was unavailable at [facility] until 09/14/12.

## 2013-09-12 ENCOUNTER — Telehealth: Payer: Self-pay

## 2013-09-13 LAB — URINE CULTURE: Colony Count: >=100000

## 2013-11-08 HISTORY — PX: OTHER SURGICAL HISTORY: SHX169

## 2013-12-09 ENCOUNTER — Encounter: Payer: Self-pay | Admitting: Family Medicine

## 2013-12-31 DIAGNOSIS — F54 Psychological and behavioral factors associated with disorders or diseases classified elsewhere: Secondary | ICD-10-CM | POA: Insufficient documentation

## 2013-12-31 DIAGNOSIS — F411 Generalized anxiety disorder: Secondary | ICD-10-CM | POA: Insufficient documentation

## 2014-03-23 ENCOUNTER — Encounter: Payer: Self-pay | Admitting: Family Medicine

## 2014-03-23 ENCOUNTER — Ambulatory Visit (INDEPENDENT_AMBULATORY_CARE_PROVIDER_SITE_OTHER): Payer: BC Managed Care – PPO | Admitting: Family Medicine

## 2014-03-23 ENCOUNTER — Telehealth: Payer: Self-pay | Admitting: Family Medicine

## 2014-03-23 ENCOUNTER — Other Ambulatory Visit (HOSPITAL_COMMUNITY)
Admission: RE | Admit: 2014-03-23 | Discharge: 2014-03-23 | Disposition: A | Discharge status: Home / Self Care | Payer: 59 | Source: Ambulatory Visit | Attending: Family Medicine | Admitting: Family Medicine

## 2014-03-23 VITALS — BP 137/82 | HR 87 | Ht 68.0 in | Wt 269.0 lb

## 2014-03-23 DIAGNOSIS — Z0000 Encounter for general adult medical examination without abnormal findings: Secondary | ICD-10-CM

## 2014-03-23 DIAGNOSIS — Z136 Encounter for screening for cardiovascular disorders: Secondary | ICD-10-CM

## 2014-03-23 DIAGNOSIS — Z124 Encounter for screening for malignant neoplasm of cervix: Secondary | ICD-10-CM

## 2014-03-23 DIAGNOSIS — Z23 Encounter for immunization: Secondary | ICD-10-CM

## 2014-03-23 DIAGNOSIS — Z113 Encounter for screening for infections with a predominantly sexual mode of transmission: Secondary | ICD-10-CM | POA: Insufficient documentation

## 2014-03-23 DIAGNOSIS — Z01419 Encounter for gynecological examination (general) (routine) without abnormal findings: Secondary | ICD-10-CM | POA: Insufficient documentation

## 2014-03-23 MED ORDER — ESCITALOPRAM OXALATE 10 MG PO TABS: 10.0000 mg | ORAL_TABLET | Freq: Every day | ORAL | Status: DC

## 2014-03-23 NOTE — Patient Instructions (Signed)
Keep up a regular exercise program and make sure you are eating a healthy diet Try to eat 4 servings of dairy a day, or if you are lactose intolerant take a calcium with vitamin D daily.  Your vaccines are up to date.   EKG shows rate of 84 beats per minute, normal sinus rhythm with normal axis. No significant ST-T wave changes. Tdap updated today.    Pap smear performed. Will add GC and chlamydia testing.

## 2014-03-23 NOTE — Telephone Encounter (Signed)
Patient just had labs done and need results faxed to [place] seeing a Dr. Evorn Gong fax number [phone]. Thanks

## 2014-03-23 NOTE — Progress Notes (Signed)
  Subjective:     Maria Payne is a 23 y.o. female and is here for a comprehensive physical exam. The patient reports no problems. She does an EKG today since she is planning on having weight loss surgery. Last pap was 2 years ago.  Due for Tdap and Gardasil.    History   Social History  . Marital Status: Married    Spouse Name: N/A    Number of Children: N/A  . Years of Education: N/A   Occupational History  . unemployed.     Social History Main Topics  . Smoking status: Never Smoker   . Smokeless tobacco: Not on file  . Alcohol Use: Yes     Comment: occ  . Drug Use: No  . Sexual Activity: Not Currently    Partners: Male   Other Topics Concern  . Not on file   Social History Narrative  . No narrative on file   Health Maintenance  Topic Date Due  . Tetanus/tdap  02/21/2009  . Influenza Vaccine  04/10/2014  . Pap Smear  12/26/2014    The following portions of the patient's history were reviewed and updated as appropriate: allergies, current medications, past family history, past medical history, past social history, past surgical history and problem list.  Review of Systems A comprehensive review of systems was negative.   Objective:    BP 137/82  Pulse 87  Ht 5\' 8"  (1.727 m)  Wt 269 lb (122.018 kg)  BMI 40.91 kg/m2 General appearance: alert, cooperative and appears stated age Head: Normocephalic, without obvious abnormality, atraumatic Eyes: conj clear, EOMI, PEERLA Ears: normal TM's and external ear canals both ears Nose: Nares normal. Septum midline. Mucosa normal. No drainage or sinus tenderness. Throat: lips, mucosa, and tongue normal; teeth and gums normal Neck: no adenopathy, no carotid bruit, no JVD, supple, symmetrical, trachea midline and thyroid not enlarged, symmetric, no tenderness/mass/nodules Back: symmetric, no curvature. ROM normal. No CVA tenderness. Lungs: clear to auscultation bilaterally Breasts: normal appearance, no masses or  tenderness Heart: regular rate and rhythm, S1, S2 normal, no murmur, click, rub or gallop Abdomen: soft, non-tender; bowel sounds normal; no masses,  no organomegaly Pelvic: cervix normal in appearance, external genitalia normal, no adnexal masses or tenderness, no cervical motion tenderness, rectovaginal septum normal, uterus normal size, shape, and consistency and vagina normal without discharge Extremities: extremities normal, atraumatic, no cyanosis or edema Pulses: 2+ and symmetric Skin: Skin color, texture, turgor normal. No rashes or lesions Lymph nodes: Cervical, supraclavicular, and axillary nodes normal. Neurologic: Alert and oriented X 3, normal strength and tone. Normal symmetric reflexes. Normal coordination and gait    Assessment:    Healthy female exam.      Plan:     See After Visit Summary for Counseling Recommendations  Keep up a regular exercise program and make sure you are eating a healthy diet Try to eat 4 servings of dairy a day, or if you are lactose intolerant take a calcium with vitamin D daily.  Your vaccines are up to date.  Urine GC and chlamydia.   EKG shows normal sinus rhythm with normal axis and no ST-T wave changes.  Discussed need for her Gardasil series. She will think about it. Handout provided for additional information.  Tdap updated today.  She separated from her husband in November. I recommend full STD testing including HIV and syphilis.

## 2014-03-24 LAB — COMPLETE METABOLIC PANEL WITH GFR
ALT: 11 U/L (ref 0–35)
AST: 12 U/L (ref 0–37)
Albumin: 4.2 g/dL (ref 3.5–5.2)
Alkaline Phosphatase: 63 U/L (ref 39–117)
BUN: 12 mg/dL (ref 6–23)
CO2: 22 mEq/L (ref 19–32)
Calcium: 9.1 mg/dL (ref 8.4–10.5)
Chloride: 108 mEq/L (ref 96–112)
Creat: 0.88 mg/dL (ref 0.50–1.10)
GFR, Est African American: >89 mL/min
GFR, Est Non African American: >89 mL/min
Glucose, Bld: 109 mg/dL — ABNORMAL HIGH (ref 70–99)
Potassium: 3.9 mEq/L (ref 3.5–5.3)
Sodium: 139 mEq/L (ref 135–145)
Total Bilirubin: 0.3 mg/dL (ref 0.2–1.2)
Total Protein: 7 g/dL (ref 6.0–8.3)

## 2014-03-24 LAB — TSH: TSH: 3.72 u[IU]/mL (ref 0.350–4.500)

## 2014-03-24 LAB — HIV ANTIBODY (ROUTINE TESTING W REFLEX): HIV 1&2 Ab, 4th Generation: NONREACTIVE

## 2014-03-24 LAB — LIPID PANEL
Cholesterol: 191 mg/dL (ref 0–200)
HDL: 38 mg/dL — ABNORMAL LOW (ref >39)
LDL Cholesterol: 124 mg/dL — ABNORMAL HIGH (ref 0–99)
Total CHOL/HDL Ratio: 5 Ratio
Triglycerides: 147 mg/dL (ref <150)
VLDL: 29 mg/dL (ref 0–40)

## 2014-03-24 LAB — RPR

## 2014-03-25 LAB — CYTOLOGY - PAP

## 2014-03-29 NOTE — Telephone Encounter (Signed)
Results faxed to [facility] Dr. Evorn Gong

## 2014-05-11 HISTORY — PX: SLEEVE GASTROPLASTY: SHX1101

## 2014-09-16 ENCOUNTER — Telehealth: Payer: Self-pay | Admitting: *Deleted

## 2014-09-16 NOTE — Telephone Encounter (Signed)
Pt called and lvm asking for a letter to be excused from jury duty.Audelia Hives [place]

## 2014-09-16 NOTE — Telephone Encounter (Signed)
Pt called and advised that Dr. Madilyn Fireman will not write this letter for her. Told to call [facility] for this and advised to schedule a f/u .Audelia Hives [place]

## 2014-09-23 ENCOUNTER — Ambulatory Visit: Payer: Self-pay | Admitting: Family Medicine

## 2014-09-23 DIAGNOSIS — Z0289 Encounter for other administrative examinations: Secondary | ICD-10-CM

## 2014-10-11 ENCOUNTER — Other Ambulatory Visit: Payer: Self-pay | Admitting: Family Medicine

## 2014-10-19 ENCOUNTER — Ambulatory Visit (INDEPENDENT_AMBULATORY_CARE_PROVIDER_SITE_OTHER): Payer: 59 | Admitting: Family Medicine

## 2014-10-19 ENCOUNTER — Encounter: Payer: Self-pay | Admitting: Family Medicine

## 2014-10-19 VITALS — BP 115/78 | HR 75 | Ht 68.0 in | Wt 213.0 lb

## 2014-10-19 DIAGNOSIS — F320 Major depressive disorder, single episode, mild: Secondary | ICD-10-CM

## 2014-10-19 DIAGNOSIS — L309 Dermatitis, unspecified: Secondary | ICD-10-CM

## 2014-10-19 DIAGNOSIS — F411 Generalized anxiety disorder: Secondary | ICD-10-CM

## 2014-10-19 MED ORDER — ESCITALOPRAM OXALATE 10 MG PO TABS: 10.0000 mg | ORAL_TABLET | Freq: Every day | ORAL | Status: DC

## 2014-10-19 NOTE — Patient Instructions (Signed)
Cetaphil - face wash and lotion Phisoderm - face wash and lotion Paula's choice - very dry skin, hydrating line (purple)

## 2014-10-19 NOTE — Progress Notes (Signed)
   Subjective:    Patient ID: Maria Payne, female    DOB: 1990-12-13, 24 y.o.   MRN: [id number]  HPI Had bariatric surgery and has lost 90 lbs. Has had shunt placed fpr her pseudotumor cerebri.  Overall she feels like she is doing fantastic. Her mood has been great she still taking the Lexapro regularly. She's very happy with where she's on life. She still has some days where she struggles with her mood a little bit so she does want to continue the medication.  Dry flakey skin on face. Just fills extremely dry and irritated. Almost burns when she puts lotion on. She typically uses Dove soap in the shower to wash her face and then has been applying some type of special lotion for dry skin. She says it, looks like Aquaphor but it's not the same. She came her the name of it.   Review of Systems     Objective:   Physical Exam  Constitutional: She is oriented to person, place, and time. She appears well-developed and well-nourished.  HENT:  Head: Normocephalic and atraumatic.  Eyes: Conjunctivae and EOM are normal.  Cardiovascular: Normal rate.   Pulmonary/Chest: Effort normal.  Neurological: She is alert and oriented to person, place, and time.  Skin: Skin is dry. No pallor.  Psychiatric: She has a normal mood and affect. Her behavior is normal.          Assessment & Plan:  Generalized anxiety/depression-doing fantastic on the Lexapro. At some point we should certainly try weaning her off. For right now she was to continue the medication. Follow up in 6 months. Next  Dry skin/eczema on the face-gave some recommendations for some products to try over-the-counter. Really focus on moisturizing very well. If this is not helping and consider a low potency steroid topically.

## 2014-10-20 ENCOUNTER — Ambulatory Visit: Payer: Self-pay | Admitting: Family Medicine

## 2014-11-11 ENCOUNTER — Ambulatory Visit (INDEPENDENT_AMBULATORY_CARE_PROVIDER_SITE_OTHER): Payer: 59 | Admitting: Obstetrics & Gynecology

## 2014-11-11 ENCOUNTER — Encounter: Payer: Self-pay | Admitting: Family Medicine

## 2014-11-11 ENCOUNTER — Encounter: Payer: Self-pay | Admitting: Obstetrics & Gynecology

## 2014-11-11 ENCOUNTER — Ambulatory Visit (INDEPENDENT_AMBULATORY_CARE_PROVIDER_SITE_OTHER): Payer: 59 | Admitting: Family Medicine

## 2014-11-11 VITALS — BP 125/78 | HR 94 | Wt 209.0 lb

## 2014-11-11 VITALS — BP 115/81 | HR 88 | Resp 16 | Ht 68.0 in | Wt 210.0 lb

## 2014-11-11 DIAGNOSIS — N764 Abscess of vulva: Secondary | ICD-10-CM

## 2014-11-11 DIAGNOSIS — N750 Cyst of Bartholin's gland: Secondary | ICD-10-CM

## 2014-11-11 NOTE — Progress Notes (Signed)
   Subjective:    Patient ID: Maria Payne, female    DOB: 1991-02-25, 23 y.o.   MRN: [id number]  HPI  This 24 yo lady is here with a week h/o an enlarging, painful right labia minora cyst.   Review of Systems     Objective:   Physical Exam  1 cm cystic structure in the mid portion of her labia minora  I used topical lidocaine gel followed by 2 cc of 1% lidocaine. I opened the cyst with a scalpel and a moderate amount of pus came out. I squeezed until no more pus was coming out.  To yield hemostasis, I placed a figure of 8 4-0 vicryl suture She tolerated the procedure well.      Assessment & Plan:  Labial abscess S/p I&D RTC 1 week for suture removal

## 2014-11-11 NOTE — Patient Instructions (Signed)
You have an appt down the all at the [facility] at 2:30. Try to be there 15 minutes early if possible.

## 2014-11-11 NOTE — Progress Notes (Signed)
   Subjective:    Patient ID: Maria Payne, female    DOB: 1990/12/12, 24 y.o.   MRN: [id number]  HPI vaginal cyst/boil x1 week. Says it is painful and getting larger. No fever, chills. Has been taking more baths. No drianage. No worsening or alleviating factors.    Review of Systems     Objective:   Physical Exam  Constitutional: She appears well-developed and well-nourished.  Genitourinary:     Skin: Skin is warm.  Psychiatric: She has a normal mood and affect.          Assessment & Plan:  Bartholin's cyst- refer to gyn for lancing and drainage. Will call to see f we can get her in before the weekend.

## 2014-11-18 ENCOUNTER — Encounter: Payer: Self-pay | Admitting: Obstetrics & Gynecology

## 2014-11-18 ENCOUNTER — Ambulatory Visit (INDEPENDENT_AMBULATORY_CARE_PROVIDER_SITE_OTHER): Payer: 59 | Admitting: Obstetrics & Gynecology

## 2014-11-18 VITALS — BP 114/80 | HR 81 | Resp 16 | Ht 68.0 in | Wt 208.0 lb

## 2014-11-18 DIAGNOSIS — N764 Abscess of vulva: Secondary | ICD-10-CM

## 2014-11-18 NOTE — Progress Notes (Signed)
Patient ID: Maria Payne, female   DOB: [birth year], 24 y.o.   MRN: [id number] HPI Seymone is here for follow up of her right labia minora abscess.  She does have non-dissolvable stitches that need to be removed today.  She did have to go to the ED 2 days after excision for severe pain of the area.  They recommended ice to area.    Filed Vitals:   11/18/14 1050  BP: 114/80  Pulse: 81  Resp: 16  Height: 5\' 8"  (1.727 m)  Weight: 208 lb (94.348 kg)    Area well healed.  No selling.  No pain. No drainage. Stitch removed with out problem.  A/P  Well healed right labia minora after abscess drainage. Pap up to date. RTC prn PCP [place].

## 2015-04-19 ENCOUNTER — Encounter: Payer: Self-pay | Admitting: Family Medicine

## 2015-04-19 ENCOUNTER — Ambulatory Visit (INDEPENDENT_AMBULATORY_CARE_PROVIDER_SITE_OTHER): Payer: BLUE CROSS/BLUE SHIELD | Admitting: Family Medicine

## 2015-04-19 VITALS — BP 115/74 | HR 71 | Ht 68.0 in | Wt 211.0 lb

## 2015-04-19 DIAGNOSIS — F418 Other specified anxiety disorders: Secondary | ICD-10-CM | POA: Diagnosis not present

## 2015-04-19 DIAGNOSIS — F329 Major depressive disorder, single episode, unspecified: Secondary | ICD-10-CM

## 2015-04-19 DIAGNOSIS — F32A Depression, unspecified: Secondary | ICD-10-CM

## 2015-04-19 DIAGNOSIS — F419 Anxiety disorder, unspecified: Secondary | ICD-10-CM

## 2015-04-19 MED ORDER — ESCITALOPRAM OXALATE 10 MG PO TABS: 10.0000 mg | ORAL_TABLET | Freq: Every day | ORAL | Status: DC

## 2015-04-19 NOTE — Progress Notes (Signed)
   Subjective:    Patient ID: TASHI ANDUJO, female    DOB: 10-16-1990, 24 y.o.   MRN: [id number]  HPI Follow-up generalized anxiety/depression-she's currently taking Lexapro. She's doing very well on this. Gad 7 score of 4 today. She does report still feeling a little nervous and on edge several days of the week. Previous gad 7 score of 5. She rates her symptoms as not difficult at all. Sh would like to continue her current regimen.  Has been more stressed lately with work.  She really wants to have a full time position. She is hoping it will go through.    Review of Systems     Objective:   Physical Exam  Constitutional: She is oriented to person, place, and time. She appears well-developed and well-nourished.  HENT:  Head: Normocephalic and atraumatic.  Cardiovascular: Normal rate, regular rhythm and normal heart sounds.   Pulmonary/Chest: Effort normal and breath sounds normal.  Neurological: She is alert and oriented to person, place, and time.  Skin: Skin is warm and dry.  Psychiatric: She has a normal mood and affect. Her behavior is normal.          Assessment & Plan:  GAD/Depression - well controlled. Gad-7 score of 4.  F/U in 6 months. She is not interested in weaning.    Time spent 15 min, > 50% spent counseling about anxiety/depression and treatment.

## 2015-06-03 ENCOUNTER — Ambulatory Visit (INDEPENDENT_AMBULATORY_CARE_PROVIDER_SITE_OTHER): Payer: BLUE CROSS/BLUE SHIELD | Admitting: Family Medicine

## 2015-06-03 ENCOUNTER — Encounter: Payer: Self-pay | Admitting: Family Medicine

## 2015-06-03 VITALS — BP 127/92 | HR 91 | Temp 97.9°F | Wt 211.0 lb

## 2015-06-03 DIAGNOSIS — J4521 Mild intermittent asthma with (acute) exacerbation: Secondary | ICD-10-CM

## 2015-06-03 MED ORDER — DOXYCYCLINE HYCLATE 100 MG PO TABS
100.0000 mg | ORAL_TABLET | Freq: Two times a day (BID) | ORAL | Status: DC
Start: 2015-06-03 — End: 2015-08-25

## 2015-06-03 MED ORDER — PREDNISONE 20 MG PO TABS: ORAL_TABLET | ORAL | Status: AC

## 2015-06-03 NOTE — Progress Notes (Signed)
CC: Maria Payne is a 24 y.o. female is here for Nasal Congestion and chest feels tight   Subjective: HPI:  3 days of wheezing with exertion, shortness of breath with exertion, nasal congestion, postnasal drip, nonproductive cough. No real benefit from albuterol. No other interventions as of yet. Seems like it's getting worse and a daily basis. Coming by fatigue. Nice chest pain, fever, chills, nor confusion. No rash. Denies rapid or irregular heartbeat.   Review Of Systems Outlined In HPI  Past Medical History  Diagnosis Date  . Pseudotumor cerebri   . Papilledema     Diamox    Past Surgical History  Procedure Laterality Date  . Angioplasty  2012    ? cerebral artery   . Stent placed  August 2014    Stenosis right transverse sigmoid junction,[facility]  . Vp shunt  11/2013    Done at [facility]  . Sleeve gastroplasty  05/2014    Dr. Evorn Gong    Family History  Problem Relation Age of Onset  . Hypertension Father   . Heart disease Father     Social History   Social History  . Marital Status: Married    Spouse Name: N/A  . Number of Children: 0  . Years of Education: N/A   Occupational History  . unemployed.    . [employer]ER [employer]ICE    Social History Main Topics  . Smoking status: Never Smoker   . Smokeless tobacco: Not on file  . Alcohol Use: Yes     Comment: occ  . Drug Use: No  . Sexual Activity:    Partners: Male   Other Topics Concern  . Not on file   Social History Narrative   seperated from husband in nov 2014.  Still legally married.      Objective: BP 127/92 mmHg  Pulse 91  Temp(Src) 97.9 F (36.6 C) (Oral)  Wt 211 lb (95.709 kg)  SpO2 99%  General: Alert and Oriented, No Acute Distress HEENT: Pupils equal, round, reactive to light. Conjunctivae clear.  External ears unremarkable, canals clear with intact TMs with appropriate landmarks.  Middle ear appears open without effusion. Pink inferior turbinates.  Moist mucous membranes, pharynx without inflammation  nor lesions.  Neck supple without palpable lymphadenopathy nor abnormal masses. Lungs: Clear to auscultation bilaterally, no wheezing/ronchi/rales.  Comfortable work of breathing. Good air movement. Cardiac: Regular rate and rhythm. Normal S1/S2.  No murmurs, rubs, nor gallops.   Extremities: No peripheral edema.  Strong peripheral pulses.  Mental Status: No depression, anxiety, nor agitation. Skin: Warm and dry.  Assessment & Plan: Maria Payne was seen today for nasal congestion and chest feels tight.  Diagnoses and all orders for this visit:  Asthma with exacerbation, mild intermittent -     doxycycline (VIBRA-TABS) 100 MG tablet; Take 1 tablet (100 mg total) by mouth 2 (two) times daily.  Other orders -     predniSONE (DELTASONE) 20 MG tablet; Three tabs at once daily for five days.   Suspect bacterial sinusitis with asthma exacerbation, since she is not wheezing today I like to see if she can get by with doxycycline alone. If wheezing persists on Sunday or symptoms get worse on Saturday start prednisone.Signs and symptoms requring emergent/urgent reevaluation were discussed with the patient.   Return if symptoms worsen or fail to improve.

## 2015-08-25 ENCOUNTER — Encounter: Payer: Self-pay | Admitting: Osteopathic Medicine

## 2015-08-25 ENCOUNTER — Ambulatory Visit (INDEPENDENT_AMBULATORY_CARE_PROVIDER_SITE_OTHER): Payer: BLUE CROSS/BLUE SHIELD | Admitting: Osteopathic Medicine

## 2015-08-25 VITALS — BP 127/81 | HR 77 | Ht 68.0 in | Wt 215.0 lb

## 2015-08-25 DIAGNOSIS — R21 Rash and other nonspecific skin eruption: Secondary | ICD-10-CM | POA: Diagnosis not present

## 2015-08-25 NOTE — Progress Notes (Signed)
HPI: Maria Payne is a 24 y.o. female who presents to [facility] today for chief complaint of:  Chief Complaint  Patient presents with  . Bleeding/Bruising    brusing all over     . Location: all over, see below for duration/onset . Quality: just skin changes - bruises on legs and whelps on abdomen and chest. Nonpainful, non-itching . Duration: feet about a week ago, then legs about 4 - 5 days ago, chest this morning . Context: some soreness near the ones on the abdomen, this is also where her shunt is. . Assoc signs/symptoms: No fever or chills, patient had 1 episode of loose stool a few days ago but no persistent abdominal pain or diarrhea or constipation, no nausea/vomiting, no other signs or symptoms of excessive bleeding suggesting from the gums nosebleeds or blood in stool, no known contact with allergens such as pets or insects or new detergents or soaps or lotions   Past medical, social and family history reviewed: Past Medical History  Diagnosis Date  . Pseudotumor cerebri   . Papilledema     Diamox   Past Surgical History  Procedure Laterality Date  . Angioplasty  2012    ? cerebral artery   . Stent placed  August 2014    Stenosis right transverse sigmoid junction,[facility]  . Vp shunt  11/2013    Done at [facility]  . Sleeve gastroplasty  05/2014    Dr. Evorn Gong    Social History  Substance Use Topics  . Smoking status: Never Smoker   . Smokeless tobacco: Not on file  . Alcohol Use: Yes     Comment: occ   Family History  Problem Relation Age of Onset  . Hypertension Father   . Heart disease Father     Current Outpatient Prescriptions  Medication Sig Dispense Refill  . aspirin 81 MG chewable tablet Chew 81 mg by mouth. Chew 2 tablets by mouth two times a day    . Cholecalciferol (VITAMIN D) 2000 UNITS tablet Take 2,000 Units by mouth.    . escitalopram (LEXAPRO) 10 MG tablet Take 1 tablet (10 mg total) by mouth daily. 90 tablet 1  .  Multiple Vitamins-Minerals (THERA-M) TABS Take 1 tablet by mouth.    [name] Kitchen omeprazole (PRILOSEC) 20 MG capsule Take one tab the night before surgery then one tab twice a day    . albuterol (PROVENTIL HFA;VENTOLIN HFA) 108 (90 BASE) MCG/ACT inhaler Inhale 2 puffs into the lungs every 4 (four) hours as needed for wheezing. 1 Inhaler 1   No current facility-administered medications for this visit.   Allergies  Allergen Reactions  . Codeine   . Hydrocodone-Acetaminophen Rash    Headaches Morphine, Dilaudid OK  . Oxycodone Rash    Headaches  . Tape Rash    welps      Review of Systems: CONSTITUTIONAL:  No  fever, no chills, No  unintentional weight changes HEAD/EYES/EARS/NOSE/THROAT: No  headache,  CARDIAC: No  chest pain, No  pressure, No palpitations, No  orthopnea RESPIRATORY: No  cough, No  shortness of breath/wheeze GASTROINTESTINAL: No  nausea, No  vomiting, No  abdominal pain except vague soreness right abdomen where shunt runs, No  blood in stool, No  diarrhea, No  constipation  MUSCULOSKELETAL: No  myalgia/arthralgia SKIN: Yes  rash/wounds/concerning lesions as per HPI HEM/ONC: Yes  easy bruising/bleeding, No  abnormal lymph node NEUROLOGIC: No  weakness, No dizziness, No  slurred speech  Exam:  BP 127/81 mmHg  Pulse 77  Ht 5\' 8"  (1.727 m)  Wt 215 lb (97.523 kg)  BMI 32.70 kg/m2 Constitutional: VS see above. General Appearance: alert, well-developed, well-nourished, NAD Eyes: Normal lids and conjunctive, non-icteric sclera,  Respiratory: Normal respiratory effort. no wheeze, no rhonchi, no rales Cardiovascular: S1/S2 normal, no murmur, no rub/gallop auscultated. RRR.  Gastrointestinal: Nontender, no masses. No hepatomegaly, no splenomegaly. No hernia appreciated. Bowel sounds normal. Rectal exam deferred.  Musculoskeletal: Gait normal. No clubbing/cyanosis of digits.  Neurological: No cranial nerve deficit on limited exam. Motor and sensation intact and  symmetric Skin: warm, dry, intact. Urticarial appearing mildly edematous macular lesions 3 on abdomen, very mildly erythematous, nontender, no drainage or ulceration, faint bruises small and scattered on thighs 3, left chest positive bruising/ecchymosis. No widespread petechiae or purpura, no widespread urticarial rash,. No concerning nevi or subq nodules on limited exam.   Psychiatric: Normal judgment/insight. Normal mood and affect. Oriented x3.    No results found for this or any previous visit (from the past 72 hour(s)).    ASSESSMENT/PLAN: Able to determine etiology of rash based on physical exam, but suspicion for contact dermatitis with normal bruises, will get labs as below further evaluation particularly in light of patient's bruising without injury, plus there is some concern that this abdominal tenderness near where her VP shunt empties is a concern, doubt significant infection given lack of fever/chills or severe pain, patient is advised to check with her neurosurgeon if they would want her to come back sooner than for her routine follow-up, ER precautions are reviewed but I will hold off on imaging for now I think don't think this will be of any benefit, but will consider based on lab results. Patient is asked to make an appointment to follow-up with Dr. Suzi Roots in 1 week to recheck symptoms, will call sooner with results of labs once available. May consider dermatology referral if he is clearing up.  Rash and nonspecific skin eruption - Plan: CBC with Differential/Platelet, COMPLETE METABOLIC PANEL WITH GFR, Protime-INR, APTT, Pathologist smear review    Return in about 1 week (around 09/01/2015), or if symptoms worsen or fail to improve, for visit with Dr Madilyn Fireman recheck rash.

## 2015-08-26 LAB — CBC WITH DIFFERENTIAL/PLATELET
Basophils Absolute: 0 10*3/uL (ref 0.0–0.1)
Basophils Relative: 0 % (ref 0–1)
Eosinophils Absolute: 0.1 10*3/uL (ref 0.0–0.7)
Eosinophils Relative: 2 % (ref 0–5)
HCT: 40.8 % (ref 36.0–46.0)
Hemoglobin: 13.3 g/dL (ref 12.0–15.0)
Lymphocytes Relative: 33 % (ref 12–46)
Lymphs Abs: 1.4 10*3/uL (ref 0.7–4.0)
MCH: 28.3 pg (ref 26.0–34.0)
MCHC: 32.6 g/dL (ref 30.0–36.0)
MCV: 86.8 fL (ref 78.0–100.0)
MPV: 9.5 fL (ref 8.6–12.4)
Monocytes Absolute: 0.2 10*3/uL (ref 0.1–1.0)
Monocytes Relative: 6 % (ref 3–12)
Neutro Abs: 2.4 10*3/uL (ref 1.7–7.7)
Neutrophils Relative %: 59 % (ref 43–77)
Platelets: 278 10*3/uL (ref 150–400)
RBC: 4.7 MIL/uL (ref 3.87–5.11)
RDW: 13.7 % (ref 11.5–15.5)
WBC: 4.1 10*3/uL (ref 4.0–10.5)

## 2015-08-26 LAB — COMPLETE METABOLIC PANEL WITH GFR
ALT: 11 U/L (ref 6–29)
AST: 14 U/L (ref 10–30)
Albumin: 4.3 g/dL (ref 3.6–5.1)
Alkaline Phosphatase: 44 U/L (ref 33–115)
BUN: 16 mg/dL (ref 7–25)
CO2: 28 mmol/L (ref 20–31)
Calcium: 9 mg/dL (ref 8.6–10.2)
Chloride: 107 mmol/L (ref 98–110)
Creat: 0.67 mg/dL (ref 0.50–1.10)
GFR, Est African American: >89 mL/min (ref >=60)
GFR, Est Non African American: >89 mL/min (ref >=60)
Glucose, Bld: 76 mg/dL (ref 65–99)
Potassium: 4.4 mmol/L (ref 3.5–5.3)
Sodium: 136 mmol/L (ref 135–146)
Total Bilirubin: 0.5 mg/dL (ref 0.2–1.2)
Total Protein: 6.5 g/dL (ref 6.1–8.1)

## 2015-08-26 LAB — PROTIME-INR
INR: 1.07 (ref <1.50)
Prothrombin Time: 14 seconds (ref 11.6–15.2)

## 2015-08-26 LAB — APTT: aPTT: 34 seconds (ref 24–37)

## 2015-08-29 LAB — PATHOLOGIST SMEAR REVIEW

## 2015-09-13 ENCOUNTER — Ambulatory Visit: Payer: Self-pay | Admitting: Family Medicine

## 2015-10-20 ENCOUNTER — Ambulatory Visit: Payer: Self-pay | Admitting: Family Medicine

## 2015-11-02 ENCOUNTER — Emergency Department (INDEPENDENT_AMBULATORY_CARE_PROVIDER_SITE_OTHER): Payer: BLUE CROSS/BLUE SHIELD

## 2015-11-02 ENCOUNTER — Emergency Department
Admission: EM | Admit: 2015-11-02 | Discharge: 2015-11-02 | Disposition: A | Discharge status: Home / Self Care | Payer: BLUE CROSS/BLUE SHIELD | Source: Home / Self Care | Attending: Family Medicine | Admitting: Family Medicine

## 2015-11-02 ENCOUNTER — Encounter: Payer: Self-pay | Admitting: *Deleted

## 2015-11-02 DIAGNOSIS — J111 Influenza due to unidentified influenza virus with other respiratory manifestations: Secondary | ICD-10-CM

## 2015-11-02 DIAGNOSIS — R05 Cough: Secondary | ICD-10-CM

## 2015-11-02 DIAGNOSIS — R69 Illness, unspecified: Principal | ICD-10-CM

## 2015-11-02 MED ORDER — OSELTAMIVIR PHOSPHATE 75 MG PO CAPS: 75.0000 mg | ORAL_CAPSULE | Freq: Two times a day (BID) | ORAL | Status: DC

## 2015-11-02 MED ORDER — BENZONATATE 200 MG PO CAPS: 200.0000 mg | ORAL_CAPSULE | Freq: Every day | ORAL | Status: DC

## 2015-11-02 MED ORDER — PREDNISONE 20 MG PO TABS: 20.0000 mg | ORAL_TABLET | Freq: Two times a day (BID) | ORAL | Status: DC

## 2015-11-02 NOTE — ED Notes (Signed)
Pt c/o cough, post nasal drip, bilateral ear ache, body aches, and HA x 1 wk. Temp of 101 yesterday. She reports having brain sx 1 month ago.

## 2015-11-02 NOTE — Discharge Instructions (Signed)
Take plain guaifenesin (1200mg  extended release tabs such as Mucinex) twice daily, with plenty of water, for cough and congestion.  May add Pseudoephedrine (30mg , one or two every 4 to 6 hours) for sinus congestion.  Get adequate rest.   May use Afrin nasal spray (or generic oxymetazoline) twice daily for about 5 days and then discontinue.  Also recommend using saline nasal spray several times daily and saline nasal irrigation (AYR is a common brand).  Use Flonase nasal spray each morning after using Afrin nasal spray and saline nasal irrigation. Try warm salt water gargles for sore throat.  Stop all antihistamines for now, and other non-prescription cough/cold preparations. Continue albuterol inhaler as needed. Follow-up with family doctor if not improving about one week.

## 2015-11-02 NOTE — ED Provider Notes (Signed)
CSN: [id number]     Arrival date & time 11/02/15  0902 History   First MD Initiated Contact with Patient 11/02/15 1105     Chief Complaint  Patient presents with  . Cough      HPI Comments: Patient has a history of mild asthma. About 6 days ago she developed tightness in her anterior chest with increased wheezing and she began using her albuterol inhaler.  Two days ago she developed flu-like symptoms including myalgias, headache, fever/chills, fatigue, and cough.  Also has mild nasal congestion and sore throat.  Cough is non-productive and worse at night.   She has not had a flu shot this season.  She had chills/sweats and fever to 101 yesterday. She had brain surgery one month ago.  The history is provided by the patient.    Past Medical History  Diagnosis Date  . Pseudotumor cerebri   . Papilledema     Diamox   Past Surgical History  Procedure Laterality Date  . Angioplasty  2012    ? cerebral artery   . Stent placed  August 2014    Stenosis right transverse sigmoid junction,[facility]  . Vp shunt  11/2013    Done at [facility]  . Sleeve gastroplasty  05/2014    Dr. Evorn Gong    Family History  Problem Relation Age of Onset  . Hypertension Father   . Heart disease Father    Social History  Substance Use Topics  . Smoking status: Never Smoker   . Smokeless tobacco: None  . Alcohol Use: Yes     Comment: occ   OB History    No data available     Review of Systems + sore throat + hoarse + cough No pleuritic pain + wheezing + nasal congestion + post-nasal drainage No sinus pain/pressure No itchy/red eyes ? earache No hemoptysis + SOB + fever, + chills No nausea No vomiting No abdominal pain No diarrhea No urinary symptoms No skin rash + fatigue + myalgias + headache Used OTC meds without relief  Allergies  Codeine; Hydrocodone-acetaminophen; Oxycodone; and Tape  Home Medications   Prior to Admission medications   Medication Sig Start Date End Date Taking?  Authorizing Provider  aspirin 81 MG chewable tablet Chew 81 mg by mouth. Chew 2 tablets by mouth two times a day   Yes Historical Provider, MD  Cholecalciferol (VITAMIN D) 2000 UNITS tablet Take 2,000 Units by mouth. 04/19/14  Yes Historical Provider, MD  escitalopram (LEXAPRO) 10 MG tablet Take 1 tablet (10 mg total) by mouth daily. 04/19/15  Yes Hali Marry, MD  Multiple Vitamins-Minerals (THERA-M) TABS Take 1 tablet by mouth. 04/19/14  Yes Historical Provider, MD  albuterol (PROVENTIL HFA;VENTOLIN HFA) 108 (90 BASE) MCG/ACT inhaler Inhale 2 puffs into the lungs every 4 (four) hours as needed for wheezing. 07/08/12 07/08/13  Kandra Nicolas, MD  benzonatate (TESSALON) 200 MG capsule Take 1 capsule (200 mg total) by mouth at bedtime. Take as needed for cough 11/02/15   Kandra Nicolas, MD  omeprazole (PRILOSEC) 20 MG capsule Take one tab the night before surgery then one tab twice a day 03/18/14   Historical Provider, MD  oseltamivir (TAMIFLU) 75 MG capsule Take 1 capsule (75 mg total) by mouth every 12 (twelve) hours. 11/02/15   Kandra Nicolas, MD  predniSONE (DELTASONE) 20 MG tablet Take 1 tablet (20 mg total) by mouth 2 (two) times daily. Take with food. 11/02/15   Kandra Nicolas, MD  Meds Ordered and Administered this Visit  Medications - No data to display  BP 112/77 mmHg  Pulse 78  Temp(Src) 98.3 F (36.8 C) (Oral)  Resp 18  Ht 5\' 8"  (1.727 m)  Wt 217 lb (98.431 kg)  BMI 33.00 kg/m2  SpO2 99%  LMP 10/09/2015 No data found.   Physical Exam Nursing notes and Vital Signs reviewed. Appearance:  Patient appears stated age, and in no acute distress.  Patient is obese (BMI 33.0) Eyes:  Pupils are equal, round, and reactive to light and accomodation.  Extraocular movement is intact.  Conjunctivae are not inflamed  Ears:  Canals normal.  Tympanic membranes normal.  Nose:  Mildly congested turbinates.  No sinus tenderness.   Pharynx:  Normal Neck:  Supple.  Tender enlarged posterior  nodes are palpated bilaterally  Lungs:  Clear to auscultation.  Breath sounds are equal.  Moving air well. Heart:  Regular rate and rhythm without murmurs, rubs, or gallops.  Abdomen:  Nontender without masses or hepatosplenomegaly.  Bowel sounds are present.  No CVA or flank tenderness.  Extremities:  No edema.  Skin:  No rash present.   ED Course  Procedures none   Imaging Review Dg Chest 2 View  11/02/2015  CLINICAL DATA:  Cough, 1 week duration.  Fever beginning yesterday. EXAM: CHEST  2 VIEW COMPARISON:  None. FINDINGS: The heart size is normal. Mediastinal shadows are normal. The lungs are clear. No effusions. No bony abnormality. VP shunt tubing seen passing over the right anterior chest. IMPRESSION: No active cardiopulmonary disease. Electronically Signed   By: Nelson Chimes M.D.   On: 11/02/2015 11:30      MDM   1. Influenza-like illness    Begin Tamiflu.  Prescription written for Benzonatate [name]) to take at bedtime for night-time cough.  Begin prednisone burst. Take plain guaifenesin (1200mg  extended release tabs such as Mucinex) twice daily, with plenty of water, for cough and congestion.  May add Pseudoephedrine (30mg , one or two every 4 to 6 hours) for sinus congestion.  Get adequate rest.   May use Afrin nasal spray (or generic oxymetazoline) twice daily for about 5 days and then discontinue.  Also recommend using saline nasal spray several times daily and saline nasal irrigation (AYR is a common brand).  Use Flonase nasal spray each morning after using Afrin nasal spray and saline nasal irrigation. Try warm salt water gargles for sore throat.  Stop all antihistamines for now, and other non-prescription cough/cold preparations. Continue albuterol inhaler as needed. Follow-up with family doctor if not improving about one week.    Kandra Nicolas, MD 11/02/15 [phone]

## 2015-11-07 ENCOUNTER — Ambulatory Visit (INDEPENDENT_AMBULATORY_CARE_PROVIDER_SITE_OTHER): Payer: BLUE CROSS/BLUE SHIELD

## 2015-11-07 ENCOUNTER — Ambulatory Visit (INDEPENDENT_AMBULATORY_CARE_PROVIDER_SITE_OTHER): Payer: BLUE CROSS/BLUE SHIELD | Admitting: Family Medicine

## 2015-11-07 ENCOUNTER — Ambulatory Visit (HOSPITAL_BASED_OUTPATIENT_CLINIC_OR_DEPARTMENT_OTHER): Payer: BLUE CROSS/BLUE SHIELD

## 2015-11-07 ENCOUNTER — Encounter: Payer: Self-pay | Admitting: Family Medicine

## 2015-11-07 VITALS — BP 129/78 | HR 106 | Ht 68.0 in | Wt 216.0 lb

## 2015-11-07 DIAGNOSIS — R1011 Right upper quadrant pain: Secondary | ICD-10-CM

## 2015-11-07 DIAGNOSIS — R110 Nausea: Secondary | ICD-10-CM | POA: Diagnosis not present

## 2015-11-07 LAB — COMPLETE METABOLIC PANEL WITH GFR
ALT: 14 U/L (ref 6–29)
AST: 12 U/L (ref 10–30)
Albumin: 3.9 g/dL (ref 3.6–5.1)
Alkaline Phosphatase: 53 U/L (ref 33–115)
BUN: 13 mg/dL (ref 7–25)
CO2: 28 mmol/L (ref 20–31)
Calcium: 9.6 mg/dL (ref 8.6–10.2)
Chloride: 102 mmol/L (ref 98–110)
Creat: 0.6 mg/dL (ref 0.50–1.10)
GFR, Est African American: >89 mL/min (ref >=60)
GFR, Est Non African American: >89 mL/min (ref >=60)
Glucose, Bld: 72 mg/dL (ref 65–99)
Potassium: 4.3 mmol/L (ref 3.5–5.3)
Sodium: 138 mmol/L (ref 135–146)
Total Bilirubin: 0.4 mg/dL (ref 0.2–1.2)
Total Protein: 6.4 g/dL (ref 6.1–8.1)

## 2015-11-07 LAB — CBC WITH DIFFERENTIAL/PLATELET
Basophils Absolute: 0 10*3/uL (ref 0.0–0.1)
Basophils Relative: 0 % (ref 0–1)
Eosinophils Absolute: 0.1 10*3/uL (ref 0.0–0.7)
Eosinophils Relative: 2 % (ref 0–5)
HCT: 37.6 % (ref 36.0–46.0)
Hemoglobin: 12.3 g/dL (ref 12.0–15.0)
Lymphocytes Relative: 24 % (ref 12–46)
Lymphs Abs: 1.6 10*3/uL (ref 0.7–4.0)
MCH: 28.4 pg (ref 26.0–34.0)
MCHC: 32.7 g/dL (ref 30.0–36.0)
MCV: 86.8 fL (ref 78.0–100.0)
MPV: 9.1 fL (ref 8.6–12.4)
Monocytes Absolute: 0.4 10*3/uL (ref 0.1–1.0)
Monocytes Relative: 6 % (ref 3–12)
Neutro Abs: 4.5 10*3/uL (ref 1.7–7.7)
Neutrophils Relative %: 68 % (ref 43–77)
Platelets: 350 10*3/uL (ref 150–400)
RBC: 4.33 MIL/uL (ref 3.87–5.11)
RDW: 13.6 % (ref 11.5–15.5)
WBC: 6.6 10*3/uL (ref 4.0–10.5)

## 2015-11-07 NOTE — Progress Notes (Signed)
Subjective:    Patient ID: Maria Payne, female    DOB: 28-Apr-1991, 25 y.o.   MRN: [id number]  HPI Abdominal Pain x 1 wk pain below rib cage shoots to her back and up towards her right posterior shoulder.  some nausea after a meal no vomiting/or diarrhea. She feels bloated. Worse with movement. She had a shunt revision a month agoFor her pseudotumor cerebrai. Hx of sleeve gastroplasty in 05/2014.  He still has her gallbladder. No blood in the stool. No fevers chills or sweats.        Review of Systems  BP 129/78 mmHg  Pulse 106  Ht 5\' 8"  (1.727 m)  Wt 216 lb (97.977 kg)  BMI 32.85 kg/m2  SpO2 99%  LMP 10/09/2015    Allergies  Allergen Reactions  . Codeine   . Hydrocodone-Acetaminophen Rash    Headaches Morphine, Dilaudid OK  . Oxycodone Rash    Headaches  . Tape Rash    welps    Past Medical History  Diagnosis Date  . Pseudotumor cerebri   . Papilledema     Diamox    Past Surgical History  Procedure Laterality Date  . Angioplasty  2012    ? cerebral artery   . Stent placed  August 2014    Stenosis right transverse sigmoid junction,[facility]  . Vp shunt  11/2013    Done at [facility]  . Sleeve gastroplasty  05/2014    Dr. Evorn Gong     Social History   Social History  . Marital Status: Married    Spouse Name: N/A  . Number of Children: 0  . Years of Education: N/A   Occupational History  . unemployed.    . CUSTOMER SERVICE    Social History Main Topics  . Smoking status: Never Smoker   . Smokeless tobacco: Not on file  . Alcohol Use: Yes     Comment: occ  . Drug Use: No  . Sexual Activity:    Partners: Male   Other Topics Concern  . Not on file   Social History Narrative   seperated from husband in nov 2014.  Still legally married.     Family History  Problem Relation Age of Onset  . Hypertension Father   . Heart disease Father     Outpatient Encounter Prescriptions as of 11/07/2015  Medication Sig  . aspirin 81 MG chewable tablet Chew 81 mg by  mouth. Chew 2 tablets by mouth two times a day  . Cholecalciferol (VITAMIN D) 2000 UNITS tablet Take 2,000 Units by mouth.  . escitalopram (LEXAPRO) 10 MG tablet Take 1 tablet (10 mg total) by mouth daily.  . Multiple Vitamins-Minerals (THERA-M) TABS Take 1 tablet by mouth.  [name] Kitchen omeprazole (PRILOSEC) 20 MG capsule Take one tab the night before surgery then one tab twice a day  . [DISCONTINUED] albuterol (PROVENTIL HFA;VENTOLIN HFA) 108 (90 BASE) MCG/ACT inhaler Inhale 2 puffs into the lungs every 4 (four) hours as needed for wheezing.  . [DISCONTINUED] benzonatate (TESSALON) 200 MG capsule Take 1 capsule (200 mg total) by mouth at bedtime. Take as needed for cough  . [DISCONTINUED] oseltamivir (TAMIFLU) 75 MG capsule Take 1 capsule (75 mg total) by mouth every 12 (twelve) hours.  . [DISCONTINUED] predniSONE (DELTASONE) 20 MG tablet Take 1 tablet (20 mg total) by mouth 2 (two) times daily. Take with food.   No facility-administered encounter medications on file as of 11/07/2015.  Objective:   Physical Exam  Constitutional: She is oriented to person, place, and time. She appears well-developed and well-nourished.  HENT:  Head: Normocephalic and atraumatic.  Cardiovascular: Normal rate, regular rhythm and normal heart sounds.   Pulmonary/Chest: Effort normal and breath sounds normal.  Abdominal: Soft. Bowel sounds are normal. She exhibits no distension and no mass. There is tenderness. There is no rebound and no guarding.  Mild tenderness diffusely but very tender in the RUQ for now.    Neurological: She is alert and oriented to person, place, and time.  Skin: Skin is warm and dry.  Psychiatric: She has a normal mood and affect. Her behavior is normal.        Assessment & Plan:  RUQ pain with nausea - eval for GB disease.  She says she artery has some medication at home to take as needed for nausea. She's not expressing vomiting. If symptoms get more severe than go to emergency  department immediately. Will call with results once available.

## 2015-11-08 ENCOUNTER — Encounter: Payer: Self-pay | Admitting: Physician Assistant

## 2015-11-11 ENCOUNTER — Other Ambulatory Visit: Payer: Self-pay | Admitting: Family Medicine

## 2015-11-11 MED ORDER — ESCITALOPRAM OXALATE 10 MG PO TABS: 10.0000 mg | ORAL_TABLET | Freq: Every day | ORAL | Status: DC

## 2015-11-16 ENCOUNTER — Encounter: Payer: Self-pay | Admitting: Physician Assistant

## 2015-11-16 ENCOUNTER — Ambulatory Visit (INDEPENDENT_AMBULATORY_CARE_PROVIDER_SITE_OTHER): Payer: BLUE CROSS/BLUE SHIELD | Admitting: Physician Assistant

## 2015-11-16 VITALS — BP 100/66 | HR 70 | Ht 68.0 in | Wt 217.0 lb

## 2015-11-16 DIAGNOSIS — K659 Peritonitis, unspecified: Secondary | ICD-10-CM | POA: Diagnosis not present

## 2015-11-16 MED ORDER — METRONIDAZOLE 500 MG PO TABS: 500.0000 mg | ORAL_TABLET | Freq: Two times a day (BID) | ORAL | Status: DC

## 2015-11-16 MED ORDER — ONDANSETRON HCL 4 MG PO TABS: ORAL_TABLET | ORAL | Status: DC

## 2015-11-16 MED ORDER — CIPROFLOXACIN HCL 500 MG PO TABS: 500.0000 mg | ORAL_TABLET | Freq: Two times a day (BID) | ORAL | Status: DC

## 2015-11-16 NOTE — Patient Instructions (Signed)
We sent prescriptions to [facility] , [place], [place]. 1. Cipro 500 mg 2. Flagyl 500 mg 3. Zofran 4 mg.

## 2015-11-16 NOTE — Progress Notes (Signed)
Patient ID: Maria Payne, female   DOB: Jul 05, 1991, 25 y.o.   MRN: [id number]   Subjective:    Patient ID: Maria Payne, female    DOB: 1990/12/29, 25 y.o.   MRN: [id number]  HPI  Maria Payne is a pleasant 25 year old white female new to GI today referred by Dr. Minna Merritts for evaluation of nausea and right-sided abdominal pain. Patient has history of pseudotumor cerebri and has a ventriculoperitoneal shunt, history of depression ADD anxiety and asthma. Patient states that she had her initial shunt placed about 4-1/2 years ago and had a shunt revision done per Dr. Harvel Ricks  on 10/04/2015/ [facility]). Patient is also status post gastric sleeve procedure in July 2015 for obesity. She says she did well after her gastric sleeve procedure and also did well after her shunt revision for the first month. About 2 weeks ago she developed some right-sided abdominal pain which had been intermittent and has now gotten worse to the point of being constant. About 4 days ago she had an abrupt increase in her discomfort which was bad enough to send her to the emergency room. She said the pain had become more generalized across her whole abdomen and she was having discomfort with walking and changing position. She did not have any documented fever or chills but did have some hot and cold episodes at home. Or diaphoresis no diarrhea vomiting etc. She had been somewhat nauseated. She had gone to her primary care doctor in late February absent with CBC and see met which were unremarkable and an upper abdominal ultrasound showed no gallstones or wall thickening and was otherwise unremarkable. On 11/14/2015 she was seen in the emergency room and again labs were done with normal CBC and UA and CMET. She then had CT of the abdomen and pelvis done which showed a small amounts of pelvic free fluid mild stranding adjacent to the sigmoid colon evidence of partial gastric resection with post surgical suture. Shunt  catheter drain terminating in the right lower quadrant and there was stranding identified within the small bowel mesentery no pathologically enlarged lymph nodes. Exline She was started on oral Cipro and Flagyl after getting IV antibiotics in the ER. Says over the past day and a half she has started to feel little bit better.  Review of Systems Pertinent positive and negative review of systems were noted in the above HPI section.  All other review of systems was otherwise negative.  Outpatient Encounter Prescriptions as of 11/16/2015  Medication Sig  . aspirin 81 MG chewable tablet Chew 81 mg by mouth. Chew 2 tablets by mouth two times a day  . Cholecalciferol (VITAMIN D) 2000 UNITS tablet Take 2,000 Units by mouth.  . escitalopram (LEXAPRO) 10 MG tablet Take 1 tablet (10 mg total) by mouth daily.  . Multiple Vitamins-Minerals (THERA-M) TABS Take 1 tablet by mouth.  [name] Kitchen omeprazole (PRILOSEC) 20 MG capsule Take one tab the night before surgery then one tab twice a day  . ciprofloxacin (CIPRO) 500 MG tablet Take 1 tablet (500 mg total) by mouth 2 (two) times daily.  . metroNIDAZOLE (FLAGYL) 500 MG tablet Take 1 tablet (500 mg total) by mouth 2 (two) times daily.  . ondansetron (ZOFRAN) 4 MG tablet Take 1 tab every 6 hours as needed for nausea.   No facility-administered encounter medications on file as of 11/16/2015.   Allergies  Allergen Reactions  . Codeine   . Hydrocodone-Acetaminophen Rash    Headaches Morphine,  Dilaudid OK  . Oxycodone Rash    Headaches  . Tape Rash    welps   Patient Active Problem List   Diagnosis Date Noted  . MDD (major depressive disorder) ([place]) 04/04/2012  . GAD (generalized anxiety disorder) 04/04/2012  . 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 Pseudotumor cerebri 09/10/2011  . DERMATITIS, PERIORAL 05/17/2010   Social History   Social History  . Marital Status: Married    Spouse Name: N/A  . Number of Children: 0  . Years of Education: N/A    Occupational History  . unemployed.    . CUSTOMER SERVICE    Social History Main Topics  . Smoking status: Never Smoker   . Smokeless tobacco: Never Used  . Alcohol Use: No     Comment: occ  . Drug Use: No  . Sexual Activity:    Partners: Male   Other Topics Concern  . Not on file   Social History Narrative   seperated from husband in nov 2014.  Still legally married.     Ms. Maria Payne family history includes Heart disease in her father; Hypertension in her father.      Objective:    Filed Vitals:   11/16/15 0923  BP: 100/66  Pulse: 70    Physical Exam  well-developed young white female in no acute distress, accompanied by her mother blood pressure 100/66 pulse 70 height 5 foot 8 weight 217 HEENT; nontraumatic normocephalic EOMI PERRLA sclera anicteric patient has the right side of her head shaved. Cardiovascular; regular rate and rhythm with S1-S2 no murmur or gallop, Pulmonary ;clear bilaterally, Abdomen; soft she is primarily tender in the right mid and right lower quadrant she has some very mild rather analyzed tenderness there is no guarding or rebound no palpable mass or hepatosplenomegaly bowel sounds are present, Rectal ;exam not done, Ext;no clubbing cyanosis or edema skin warm dry, Neuropsych; mood and affect appropriate     Assessment & Plan:   #1 25 yo female with 2-3 week hx of right sided abdominal Pain which progressed to being constant pain and more generalized abdominal pain about 5 days ago. Workup with CT scan on 11/14/2015 shows a small amount of free pelvic fluid some mild stranding along the sigmoid colon and stranding identified within the lower abdominal mesentery. She has a shunt catheter drain terminating in the right lower quadrant. I suspect she has a mild peritonitis secondary to BP shunt revisions/replacement  #2 status post gastric sleeve procedure for obesity 2015 #3 history of pseudotumor cerebri status post initial VP shunt proximally  for an half years ago with shunt revision 10/04/2015 #4 history of depression   Plan; continue Cipro 500 mg by mouth twice a day 14 days Continue Flagyl 500 mg by mouth twice a day 14 days will call in additional Continue Zofran as needed for nausea Have advised patient and her mother to call patient's neurosurgeon Dr. Harvel Ricks with [facility] to be reevaluated within the next 24-48 hours. I do not think she has any other underlying GI issue Patient is also advised should she develop any worsening pain more generalized pain fever of 101 or over that she should proceed to the emergency room for evaluation, probable admission and IV antibiotics and was advised she may be best served by seeking care at [facility] where her neurosurgeon is located.   Amy S Esterwood PA-C 11/16/2015   Cc: Hali Marry, *

## 2015-11-16 NOTE — Progress Notes (Signed)
Agree with thoughtful assessment, impression, and plans

## 2015-11-21 DIAGNOSIS — R1010 Upper abdominal pain, unspecified: Secondary | ICD-10-CM | POA: Insufficient documentation

## 2015-11-25 ENCOUNTER — Telehealth: Payer: Self-pay | Admitting: *Deleted

## 2015-11-25 NOTE — Telephone Encounter (Signed)
Spoke with pt's father and he stated that she has been to the ED for Gallbladder issues she had a CT done and Dr. Madilyn Fireman sent her to the GI and thought that a test was going to be done to check her gallbladder. He stated that when they pressed on her belly she was so tender and was told that she had bacteria around her gut revision. She was then sent to neuro they said that it wasn't the shunt that was causing her the pain that she is experiencing they cut her higher up. She then was sent to Dr. Evorn Gong that did her gastric bypass who then sent her to Dr. Lauraine Rinne surgeon that recommends that she have her gallbladder removed but he is unsure of if this is what it is he wants her to have done.  Because Dr. Madilyn Fireman is her pcp he would like her opinion about this. He states that she is in so much pain. She is scheduled to have surgery on Wednesday. Will fwd to pcp for advice.Audelia Hives [place]

## 2015-11-25 NOTE — Telephone Encounter (Signed)
Pt's father called and lvm asking that Dr. Madilyn Fireman rtn his call regarding his daughter.

## 2015-11-28 NOTE — Telephone Encounter (Signed)
Call pt discussed issues.  She still having right upper quadrant pain is radiating up to her posterior shoulder. She still getting nauseated every time she eats. Even though her gallbladder scan was normal at sounds like they have filled out other potential causes. I think at this point if either her gallbladder or possibly an adhesion. I think he would probably wait be worth going ahead with the surgery but certainly accept her. They have at least ruled out any type of tumors or masses or anything worrisome.

## 2016-05-24 ENCOUNTER — Ambulatory Visit (INDEPENDENT_AMBULATORY_CARE_PROVIDER_SITE_OTHER): Payer: Self-pay | Admitting: Family Medicine

## 2016-05-24 ENCOUNTER — Encounter: Payer: Self-pay | Admitting: Family Medicine

## 2016-05-24 VITALS — BP 130/77 | HR 86 | Wt 230.0 lb

## 2016-05-24 DIAGNOSIS — Z6834 Body mass index (BMI) 34.0-34.9, adult: Secondary | ICD-10-CM

## 2016-05-24 DIAGNOSIS — F329 Major depressive disorder, single episode, unspecified: Secondary | ICD-10-CM

## 2016-05-24 DIAGNOSIS — F418 Other specified anxiety disorders: Secondary | ICD-10-CM

## 2016-05-24 DIAGNOSIS — F32A Depression, unspecified: Secondary | ICD-10-CM

## 2016-05-24 DIAGNOSIS — F419 Anxiety disorder, unspecified: Secondary | ICD-10-CM

## 2016-05-24 DIAGNOSIS — L709 Acne, unspecified: Secondary | ICD-10-CM

## 2016-05-24 MED ORDER — NORETHIN ACE-ETH ESTRAD-FE 1-20 MG-MCG PO TABS: 1.0000 | ORAL_TABLET | Freq: Every day | ORAL | 11 refills | Status: DC

## 2016-05-24 MED ORDER — ESCITALOPRAM OXALATE 20 MG PO TABS: 20.0000 mg | ORAL_TABLET | Freq: Every day | ORAL | 3 refills | Status: DC
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
## 2016-06-12 ENCOUNTER — Telehealth: Payer: Self-pay | Admitting: *Deleted

## 2016-06-12 NOTE — Telephone Encounter (Signed)
Pt informed that letter will be up front for p/u.Maria Payne [place]

## 2016-06-12 NOTE — Telephone Encounter (Signed)
Pt called and lvm asking if Dr. Madilyn Fireman would write her a letter to excuse her from jury duty due to her anxiety and depression. She reports that this has been done for her in the past.   I looked back in pt's chart and did not see where this has been done for her before.

## 2016-08-29 ENCOUNTER — Encounter: Payer: Self-pay | Admitting: Family Medicine

## 2016-08-29 ENCOUNTER — Ambulatory Visit (INDEPENDENT_AMBULATORY_CARE_PROVIDER_SITE_OTHER): Payer: Self-pay | Admitting: Family Medicine

## 2016-08-29 VITALS — BP 129/70 | HR 86 | Temp 98.3°F | Wt 226.0 lb

## 2016-08-29 DIAGNOSIS — H60391 Other infective otitis externa, right ear: Secondary | ICD-10-CM

## 2016-08-29 DIAGNOSIS — J209 Acute bronchitis, unspecified: Secondary | ICD-10-CM

## 2016-08-29 MED ORDER — NEOMYCIN-POLYMYXIN-HC 3.5-10000-1 OT SOLN: 3.0000 [drp] | Freq: Four times a day (QID) | OTIC | 0 refills | Status: DC

## 2016-08-29 MED ORDER — PREDNISONE 10 MG PO TABS: 30.0000 mg | ORAL_TABLET | Freq: Every day | ORAL | 0 refills | Status: DC

## 2016-08-29 MED ORDER — AZITHROMYCIN 250 MG PO TABS: 250.0000 mg | ORAL_TABLET | Freq: Every day | ORAL | 0 refills | Status: DC

## 2016-08-29 MED ORDER — TRAMADOL HCL 50 MG PO TABS: 50.0000 mg | ORAL_TABLET | Freq: Every evening | ORAL | 0 refills | Status: DC | PRN

## 2016-08-29 NOTE — Patient Instructions (Signed)
Thank you for coming in today. 4 cough use the tramadol cough pills at night. Take prednisone and azithromycin.   For Pain use prescription eardrops and/or a 50/50 mixture of rubbing alcohol and white vinegar.  Return if not improved.  Research drug prices by going to good Rx   Acute Bronchitis, Adult Acute bronchitis is when air tubes (bronchi) in the lungs suddenly get swollen. The condition can make it hard to breathe. It can also cause these symptoms:  A cough.  Coughing up clear, yellow, or green mucus.  Wheezing.  Chest congestion.  Shortness of breath.  A fever.  Body aches.  Chills.  A sore throat. Follow these instructions at home: Medicines  Take over-the-counter and prescription medicines only as told by your doctor.  If you were prescribed an antibiotic medicine, take it as told by your doctor. Do not stop taking the antibiotic even if you start to feel better. General instructions  Rest.  Drink enough fluids to keep your pee (urine) clear or pale yellow.  Avoid smoking and secondhand smoke. If you smoke and you need help quitting, ask your doctor. Quitting will help your lungs heal faster.  Use an inhaler, cool mist vaporizer, or humidifier as told by your doctor.  Keep all follow-up visits as told by your doctor. This is important. How is this prevented? To lower your risk of getting this condition again:  Wash your hands often with soap and water. If you cannot use soap and water, use hand sanitizer.  Avoid contact with people who have cold symptoms.  Try not to touch your hands to your mouth, nose, or eyes.  Make sure to get the flu shot every year. Contact a doctor if:  Your symptoms do not get better in 2 weeks. Get help right away if:  You cough up blood.  You have chest pain.  You have very bad shortness of breath.  You become dehydrated.  You faint (pass out) or keep feeling like you are going to pass out.  You keep  throwing up (vomiting).  You have a very bad headache.  Your fever or chills gets worse. This information is not intended to replace advice given to you by your health care provider. Make sure you discuss any questions you have with your health care provider. Document Released: 02/13/2008 Document Revised: 04/04/2016 Document Reviewed: 02/15/2016 Elsevier Interactive Patient Education  2017 [employer]    Otitis Externa Otitis externa is a germ infection in the outer ear. The outer ear is the area from the eardrum to the outside of the ear. Otitis externa is sometimes called "swimmer's ear." HOME CARE  Put drops in the ear as told by your doctor.  Only take medicine as told by your doctor.  If you have diabetes, your doctor may give you more directions. Follow your doctor's directions.  Keep all doctor visits as told. To avoid another infection:  Keep your ear dry. Use the corner of a towel to dry your ear after swimming or bathing.  Avoid scratching or putting things inside your ear.  Avoid swimming in lakes, dirty water, or pools that use a chemical called chlorine poorly.  You may use ear drops after swimming. Combine equal amounts of white vinegar and alcohol in a bottle. Put 3 or 4 drops in each ear. GET HELP IF:   You have a fever.  Your ear is still red, puffy (swollen), or painful after 3 days.  You still have yellowish-white fluid (  pus) coming from the ear after 3 days.  Your redness, puffiness, or pain gets worse.  You have a really bad headache.  You have redness, puffiness, pain, or tenderness behind your ear. MAKE SURE YOU:   Understand these instructions.  Will watch your condition.  Will get help right away if you are not doing well or get worse. This information is not intended to replace advice given to you by your health care provider. Make sure you discuss any questions you have with your health care provider. Document Released: 02/13/2008  Document Revised: 09/17/2014 Document Reviewed: 06/06/2015 Elsevier Interactive Patient Education  2017 [employer].

## 2016-08-29 NOTE — Progress Notes (Signed)
Maria Payne is a 25 y.o. female who presents to [facility]: [place] today for cough congestion runny nose. Symptoms present for about 3-4 weeks intermittently. She additionally notes wheezing and chest tightness. She's tried several over-the-counter medicines which have not helped. She's been using her albuterol inhaler which does help. No fevers or chills. She also has a history of pseudotumor cerebri and history of central venous sinus thrombosis. These are well managed with a shunt that was revised about a year ago and is working correctly. She denies significant headache.  Patient has right-sided ear pain present for a week or 2 as well. She has pain with chewing. She denies any change hearing. She notes that she uses earbuds frequently.   Past Medical History:  Diagnosis Date  . Papilledema    Diamox  . Pseudotumor cerebri    Past Surgical History:  Procedure Laterality Date  . ANGIOPLASTY  2012   ? cerebral artery   . SLEEVE GASTROPLASTY  05/2014   Dr. Evorn Gong   . Stent placed  August 2014   Stenosis right transverse sigmoid junction,[facility]  . VP shunt  11/2013   Done at [place] History  Substance Use Topics  . Smoking status: Never Smoker  . Smokeless tobacco: Never Used  . Alcohol use No     Comment: occ   family history includes Heart disease in her father; Hypertension in her father.  ROS as above:  Medications: Current Outpatient Prescriptions  Medication Sig Dispense Refill  . albuterol (PROVENTIL HFA;VENTOLIN HFA) 108 (90 Base) MCG/ACT inhaler Inhale 2 puffs into the lungs every 6 (six) hours as needed for wheezing or shortness of breath.    [name] Kitchen aspirin 81 MG chewable tablet Chew 81 mg by mouth. Chew 2 tablets by mouth two times a day    . azithromycin (ZITHROMAX) 250 MG tablet Take 1 tablet (250 mg total) by mouth daily. Take first 2 tablets  together, then 1 every day until finished. 6 tablet 0  . Cholecalciferol (VITAMIN D) 2000 UNITS tablet Take 2,000 Units by mouth.    . escitalopram (LEXAPRO) 20 MG tablet Take 1 tablet (20 mg total) by mouth daily. 90 tablet 3  . Multiple Vitamins-Minerals (THERA-M) TABS Take 1 tablet by mouth.    . neomycin-polymyxin-hydrocortisone (CORTISPORIN) otic solution Place 3 drops into the right ear 4 (four) times daily. 10 mL 0  . norethindrone-ethinyl estradiol (JUNEL FE,GILDESS FE,LOESTRIN FE) 1-20 MG-MCG tablet Take 1 tablet by mouth daily. 1 Package 11  . predniSONE (DELTASONE) 10 MG tablet Take 3 tablets (30 mg total) by mouth daily with breakfast. 15 tablet 0  . traMADol (ULTRAM) 50 MG tablet Take 1 tablet (50 mg total) by mouth at bedtime as needed (cough). 10 tablet 0   No current facility-administered medications for this visit.    Allergies  Allergen Reactions  . Codeine   . Hydrocodone-Acetaminophen Rash    Headaches Morphine, Dilaudid OK  . Oxycodone Rash    Headaches  . Tape Rash    welps    Health Maintenance Health Maintenance  Topic Date Due  . INFLUENZA VACCINE  12/08/2016 (Originally 04/10/2016)  . PAP SMEAR  03/23/2017  . TETANUS/TDAP  03/23/2024  . HIV Screening  Completed     Exam:  BP 129/70   Pulse 86   Temp 98.3 F (36.8 C) (Oral)   Wt 226 lb (102.5 kg)   SpO2 98%  BMI 34.36 kg/m  Gen: Well NAD HEENT: EOMI,  MMM Clear nasal discharge. Posterior pharynx cobblestoning. Normal tympanic membranes on the left. Right ear canal is erythematous and tender with motion. The actual tympanic membrane itself is normal-appearing Lungs: Normal work of breathing. CTABL Heart: RRR no MRG Abd: NABS, Soft. Nondistended, Nontender Exts: Brisk capillary refill, warm and well perfused.    No results found for this or any previous visit (from the past 72 hour(s)). No results found.    Assessment and Plan: 25 y.o. female with  Bronchitis with possible reactive airway  disease component. This is likely viral. Treat with prednisone and albuterol. Use azithromycin antibiotics as needed. We'll treat cough with tramadol as patient is allergic to codeine Norco and oxycodone. She can tolerate tramadol however.  Additionally right ear pain is likely due to otitis externa. Patient uses earbuds this is the likely causal factor. We'll use rubbing alcohol and vinegar eardrops and if not better use Cortisporin eardrops.   No orders of the defined types were placed in this encounter.   Discussed warning signs or symptoms. Please see discharge instructions. Patient expresses understanding.

## 2016-10-25 ENCOUNTER — Encounter: Payer: Self-pay | Admitting: Family Medicine

## 2016-10-25 ENCOUNTER — Ambulatory Visit (INDEPENDENT_AMBULATORY_CARE_PROVIDER_SITE_OTHER): Payer: BLUE CROSS/BLUE SHIELD | Admitting: Family Medicine

## 2016-10-25 VITALS — BP 129/69 | HR 78 | Temp 98.1°F | Wt 232.0 lb

## 2016-10-25 DIAGNOSIS — J039 Acute tonsillitis, unspecified: Secondary | ICD-10-CM | POA: Diagnosis not present

## 2016-10-25 MED ORDER — AMOXICILLIN 500 MG PO CAPS: 500.0000 mg | ORAL_CAPSULE | Freq: Two times a day (BID) | ORAL | 0 refills | Status: DC

## 2016-10-25 NOTE — Patient Instructions (Signed)
Thank you for coming in today. Take amoxicillin twice daily.  Use tylenol or ibuprofen for pain.  Return as needed.  Call or go to the emergency room if you get worse, have trouble breathing, have chest pains, or palpitations.    Tonsillitis Tonsillitis is an infection of the throat that causes the tonsils to become red, tender, and swollen. Tonsils are collections of lymphoid tissue at the back of the throat. Each tonsil has crevices (crypts). Tonsils help fight nose and throat infections and keep infection from spreading to other parts of the body for the first 18 months of life. What are the causes? Sudden (acute) tonsillitis is usually caused by infection with streptococcal bacteria. Long-lasting (chronic) tonsillitis occurs when the crypts of the tonsils become filled with pieces of food and bacteria, which makes it easy for the tonsils to become repeatedly infected. What are the signs or symptoms? Symptoms of tonsillitis include:  A sore throat, with possible difficulty swallowing.  White patches on the tonsils.  Fever.  Tiredness.  New episodes of snoring during sleep, when you did not snore before.  Small, foul-smelling, yellowish-white pieces of material (tonsilloliths) that you occasionally cough up or spit out. The tonsilloliths can also cause you to have bad breath. How is this diagnosed? Tonsillitis can be diagnosed through a physical exam. Diagnosis can be confirmed with the results of lab tests, including a throat culture. How is this treated? The goals of tonsillitis treatment include the reduction of the severity and duration of symptoms and prevention of associated conditions. Symptoms of tonsillitis can be improved with the use of steroids to reduce the swelling. Tonsillitis caused by bacteria can be treated with antibiotic medicines. Usually, treatment with antibiotic medicines is started before the cause of the tonsillitis is known. However, if it is determined that  the cause is not bacterial, antibiotic medicines will not treat the tonsillitis. If attacks of tonsillitis are severe and frequent, your health care provider may recommend surgery to remove the tonsils (tonsillectomy). Follow these instructions at home:  Rest as much as possible and get plenty of sleep.  Drink plenty of fluids. While the throat is very sore, eat soft foods or liquids, such as sherbet, soups, or instant breakfast drinks.  Eat frozen ice pops.  Gargle with a warm or cold liquid to help soothe the throat. Mix 1/4 teaspoon of salt and 1/4 teaspoon of baking soda in 8 oz of water. Contact a health care provider if:  Large, tender lumps develop in your neck.  A rash develops.  A green, yellow-brown, or bloody substance is coughed up.  You are unable to swallow liquids or food for 24 hours.  You notice that only one of the tonsils is swollen. Get help right away if:  You develop any new symptoms such as vomiting, severe headache, stiff neck, chest pain, or trouble breathing or swallowing.  You have severe throat pain along with drooling or voice changes.  You have severe pain, unrelieved with recommended medications.  You are unable to fully open the mouth.  You develop redness, swelling, or severe pain anywhere in the neck.  You have a fever. This information is not intended to replace advice given to you by your health care provider. Make sure you discuss any questions you have with your health care provider. Document Released: 06/06/2005 Document Revised: 02/02/2016 Document Reviewed: 02/13/2013 Elsevier Interactive Patient Education  2017 [employer].

## 2016-10-25 NOTE — Progress Notes (Signed)
Maria Payne is a 26 y.o. female who presents to [facility]: [place] today for congestion and sore throat.  She has had sore throat and nasal congestion with thick yellow discharge for the past 4-5 days. No fever, cough, body aches, nausea, vomiting, or diarrhea. She has been around a sick niece and nephew recently, as well as some children at church with strep throat. She takes zyrtec for seasonal allergies and albuterol for asthma but has not taken either medication recently. She has taken Tylenol with minimal relief of symptoms. Did not get flu shot this year.  Past Medical History:  Diagnosis Date  . 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 Had diagnostic cerebral arteriogram.     . GAD (generalized anxiety disorder) 04/04/2012  . Papilledema    Diamox  . Pseudotumor cerebri    Past Surgical History:  Procedure Laterality Date  . ANGIOPLASTY  2012   ? cerebral artery   . SLEEVE GASTROPLASTY  05/2014   Dr. Evorn Gong   . Stent placed  August 2014   Stenosis right transverse sigmoid junction,[facility]  . VP shunt  11/2013   Done at [place] History  Substance Use Topics  . Smoking status: Never Smoker  . Smokeless tobacco: Never Used  . Alcohol use No     Comment: occ   family history includes Heart disease in her father; Hypertension in her father.  ROS as above:  Medications: Current Outpatient Prescriptions  Medication Sig Dispense Refill  . albuterol (PROVENTIL HFA;VENTOLIN HFA) 108 (90 Base) MCG/ACT inhaler Inhale 2 puffs into the lungs every 6 (six) hours as needed for wheezing or shortness of breath.    [name] Kitchen aspirin 81 MG chewable tablet Chew 81 mg by mouth. Chew 2 tablets by mouth two times a day    . azithromycin (ZITHROMAX) 250 MG tablet Take 1 tablet (250 mg total) by mouth daily. Take first 2 tablets together, then 1 every  day until finished. 6 tablet 0  . Cholecalciferol (VITAMIN D) 2000 UNITS tablet Take 2,000 Units by mouth.    . escitalopram (LEXAPRO) 20 MG tablet Take 1 tablet (20 mg total) by mouth daily. 90 tablet 3  . Multiple Vitamins-Minerals (THERA-M) TABS Take 1 tablet by mouth.    . neomycin-polymyxin-hydrocortisone (CORTISPORIN) otic solution Place 3 drops into the right ear 4 (four) times daily. 10 mL 0  . norethindrone-ethinyl estradiol (JUNEL FE,GILDESS FE,LOESTRIN FE) 1-20 MG-MCG tablet Take 1 tablet by mouth daily. 1 Package 11  . predniSONE (DELTASONE) 10 MG tablet Take 3 tablets (30 mg total) by mouth daily with breakfast. 15 tablet 0  . traMADol (ULTRAM) 50 MG tablet Take 1 tablet (50 mg total) by mouth at bedtime as needed (cough). 10 tablet 0   No current facility-administered medications for this visit.    Allergies  Allergen Reactions  . Codeine   . Hydrocodone-Acetaminophen Rash    Headaches Morphine, Dilaudid OK  . Oxycodone Rash    Headaches  . Tape Rash    welps    Health Maintenance Health Maintenance  Topic Date Due  . INFLUENZA VACCINE  12/08/2016 (Originally 04/10/2016)  . PAP SMEAR  03/23/2017  . TETANUS/TDAP  03/23/2024  . HIV Screening  Completed     Exam:  BP 129/69 (BP Location: Right Arm, Patient Position: Sitting, Cuff Size: Large)   Pulse 78   Temp 98.1 F (36.7 C) (  Oral)   Wt 232 lb (105.2 kg)   SpO2 100%   BMI 35.28 kg/m  Gen: Well NAD HEENT: EOMI,  MMM, tonsils erythematous bilaterally with 52mm stone in right tonsil, tympanic membranes normal-appearing bilaterally, no cervical lymphadenopathy Lungs: Normal work of breathing. CTABL Heart: RRR no MRG Abd: NABS, Soft. Nondistended, Nontender Exts: Brisk capillary refill, warm and well perfused.    No results found for this or any previous visit (from the past 72 hour(s)). No results found.   Assessment and Plan: 26 y.o. female with sore throat and bilateral tonsillar erythema. Symptoms  likely related to tonsillitis.  - Amoxicillin - OTC meds and zyrtec for seasonal allergies   No orders of the defined types were placed in this encounter.  No orders of the defined types were placed in this encounter.    Discussed warning signs or symptoms. Please see discharge instructions. Patient expresses understanding.

## 2016-10-25 NOTE — Progress Notes (Signed)
Pt denies fever, Sore throat started Monday night, followed by nasal congestion and body aches.

## 2016-11-22 ENCOUNTER — Ambulatory Visit: Payer: Self-pay | Admitting: Family Medicine

## 2016-11-30 DIAGNOSIS — Z982 Presence of cerebrospinal fluid drainage device: Secondary | ICD-10-CM | POA: Insufficient documentation

## 2017-02-13 ENCOUNTER — Encounter: Payer: Self-pay | Admitting: Emergency Medicine

## 2017-02-13 ENCOUNTER — Emergency Department (INDEPENDENT_AMBULATORY_CARE_PROVIDER_SITE_OTHER)
Admission: EM | Admit: 2017-02-13 | Discharge: 2017-02-13 | Disposition: A | Discharge status: Home / Self Care | Payer: BLUE CROSS/BLUE SHIELD | Source: Home / Self Care | Attending: Family Medicine | Admitting: Family Medicine

## 2017-02-13 DIAGNOSIS — R0981 Nasal congestion: Secondary | ICD-10-CM | POA: Diagnosis not present

## 2017-02-13 DIAGNOSIS — J3489 Other specified disorders of nose and nasal sinuses: Secondary | ICD-10-CM

## 2017-02-13 DIAGNOSIS — H9202 Otalgia, left ear: Secondary | ICD-10-CM

## 2017-02-13 MED ORDER — AMOXICILLIN-POT CLAVULANATE 875-125 MG PO TABS: 1.0000 | ORAL_TABLET | Freq: Two times a day (BID) | ORAL | 0 refills | Status: DC

## 2017-02-13 NOTE — ED Triage Notes (Signed)
Pt c/o nasal congestion, ear pain and facial pressure x2 days. She is scheduled for brain surgery on 03/05/17.

## 2017-02-13 NOTE — ED Provider Notes (Signed)
CSN: [id number]     Arrival date & time 02/13/17  1627 History   First MD Initiated Contact with Patient 02/13/17 1651     Chief Complaint  Patient presents with  . Nasal Congestion   (Consider location/radiation/quality/duration/timing/severity/associated sxs/prior Treatment) HPI  Maria Payne is a 26 y.o. female presenting to UC with c/o 2 days of sinus pain and pressure with congestion, worse on Left side. Pt notes she is scheduled for surgery of a shunt placement due to pseudotumor cerebri on 03/06/17.  She has a hx of sinus infections and wants to make sure she is treated to help prevent any delay of the surgery. Denies fever, chills, n/v/d. Pt's mother started to get sick about 3-4 days ago. No other known sick contacts.   Past Medical History:  Diagnosis Date  . 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 Had diagnostic cerebral arteriogram.     . GAD (generalized anxiety disorder) 04/04/2012  . Papilledema    Diamox  . Pseudotumor cerebri    Past Surgical History:  Procedure Laterality Date  . ANGIOPLASTY  2012   ? cerebral artery   . SLEEVE GASTROPLASTY  05/2014   Dr. Evorn Gong   . Stent placed  August 2014   Stenosis right transverse sigmoid junction,[facility]  . VP shunt  11/2013   Done at [facility]   Family History  Problem Relation Age of Onset  . Hypertension Father   . Heart disease Father    Social History  Substance Use Topics  . Smoking status: Never Smoker  . Smokeless tobacco: Never Used  . Alcohol use No     Comment: occ   OB History    No data available     Review of Systems  Constitutional: Negative for chills and fever.  HENT: Positive for congestion, ear pain (Left), sinus pain and sinus pressure. Negative for sore throat, trouble swallowing and voice change.   Respiratory: Negative for cough and shortness of breath.   Cardiovascular: Negative for chest pain and palpitations.  Gastrointestinal: Negative for abdominal pain,  diarrhea, nausea and vomiting.  Musculoskeletal: Negative for arthralgias, back pain and myalgias.  Skin: Negative for rash.  Neurological: Positive for headaches ( frontal). Negative for dizziness and light-headedness.    Allergies  Codeine; Hydrocodone-acetaminophen; Oxycodone; and Tape  Home Medications   Prior to Admission medications   Medication Sig Start Date End Date Taking? Authorizing Provider  albuterol (PROVENTIL HFA;VENTOLIN HFA) 108 (90 Base) MCG/ACT inhaler Inhale 2 puffs into the lungs every 6 (six) hours as needed for wheezing or shortness of breath.    [provider]  amoxicillin (AMOXIL) 500 MG capsule Take 1 capsule (500 mg total) by mouth 2 (two) times daily. 10/25/16   Gregor Hams, MD  amoxicillin-clavulanate (AUGMENTIN) 875-125 MG tablet Take 1 tablet by mouth 2 (two) times daily. One po bid x 7 days 02/13/17   Noland Fordyce, PA-C  aspirin 81 MG chewable tablet Chew 81 mg by mouth. Chew 2 tablets by mouth two times a day    [provider]  azithromycin (ZITHROMAX) 250 MG tablet Take 1 tablet (250 mg total) by mouth daily. Take first 2 tablets together, then 1 every day until finished. 08/29/16   Gregor Hams, MD  Cholecalciferol (VITAMIN D) 2000 UNITS tablet Take 2,000 Units by mouth. 04/19/14   [provider]  escitalopram (LEXAPRO) 20 MG tablet Take 1 tablet (20 mg total) by mouth daily. 05/24/16  Hali Marry, MD  Multiple Vitamins-Minerals (THERA-M) TABS Take 1 tablet by mouth. 04/19/14   [provider]  neomycin-polymyxin-hydrocortisone (CORTISPORIN) otic solution Place 3 drops into the right ear 4 (four) times daily. 08/29/16   Gregor Hams, MD  norethindrone-ethinyl estradiol (JUNEL FE,GILDESS FE,LOESTRIN FE) 1-20 MG-MCG tablet Take 1 tablet by mouth daily. 05/24/16   Hali Marry, MD  predniSONE (DELTASONE) 10 MG tablet Take 3 tablets (30 mg total) by mouth daily with breakfast. 08/29/16   Gregor Hams, MD   traMADol (ULTRAM) 50 MG tablet Take 1 tablet (50 mg total) by mouth at bedtime as needed (cough). 08/29/16   Gregor Hams, MD   Meds Ordered and Administered this Visit  Medications - No data to display  BP 115/80 (BP Location: Right Arm)   Pulse 97   Temp 97.8 F (36.6 C) (Oral)   Wt 228 lb (103.4 kg)   LMP 02/09/2017 (Approximate)   SpO2 96%   BMI 34.67 kg/m  No data found.   Physical Exam  Constitutional: She appears well-developed and well-nourished. No distress.  HENT:  Head: Normocephalic and atraumatic.  Right Ear: Tympanic membrane normal.  Left Ear: Tympanic membrane normal.  Nose: Mucosal edema present. Right sinus exhibits maxillary sinus tenderness and frontal sinus tenderness. Left sinus exhibits maxillary sinus tenderness and frontal sinus tenderness.  Mouth/Throat: Uvula is midline, oropharynx is clear and moist and mucous membranes are normal.  Eyes: Conjunctivae are normal. No scleral icterus.  Neck: Normal range of motion. Neck supple.  Left postauricular adenopathy. Left submandibular adenopathy.   Cardiovascular: Normal rate, regular rhythm and normal heart sounds.   Pulmonary/Chest: Effort normal and breath sounds normal. No stridor. No respiratory distress. She has no wheezes. She has no rales.  Musculoskeletal: Normal range of motion.  Lymphadenopathy:    She has no cervical adenopathy.  Neurological: She is alert.  Skin: Skin is warm and dry. She is not diaphoretic.  Nursing note and vitals reviewed.   Urgent Care Course     Procedures (including critical care time)  Labs Review Labs Reviewed - No data to display  Imaging Review No results found.    MDM   1. Sinus congestion   2. Sinus pain   3. Left ear pain    Pt c/o 2 days worsening sinus congestion pain and pressure. Upcoming brain surgery on 03/06/17 Symptoms likely viral in nature, however, due to upcoming surgery, will cover for bacterial cause. Rx: Augmentin F/u with PCP  in 1 week if needed.    Noland Fordyce, PA-C 02/13/17 1746

## 2017-03-12 ENCOUNTER — Other Ambulatory Visit: Payer: Self-pay | Admitting: Family Medicine

## 2017-04-20 DIAGNOSIS — T85618A Breakdown (mechanical) of other specified internal prosthetic devices, implants and grafts, initial encounter: Secondary | ICD-10-CM | POA: Insufficient documentation

## 2017-04-25 ENCOUNTER — Telehealth: Payer: Self-pay | Admitting: Family Medicine

## 2017-04-25 NOTE — Telephone Encounter (Signed)
Pt called. She states she will be out of her Lexapro this Saturday. She has made an appt to see Provider on August 29th.

## 2017-04-26 MED ORDER — ESCITALOPRAM OXALATE 20 MG PO TABS: 20.0000 mg | ORAL_TABLET | Freq: Every day | ORAL | 0 refills | Status: DC

## 2017-04-26 NOTE — Telephone Encounter (Signed)
Medication refilled and patient is aware.

## 2017-04-28 IMAGING — CR DG CHEST 2V
2 series · 2 of 2 positions shown · non-contrast
Comparison: None.

CLINICAL DATA: Cough, 1 week duration.  Fever beginning yesterday.

EXAM:
CHEST  2 VIEW

[chest pa]
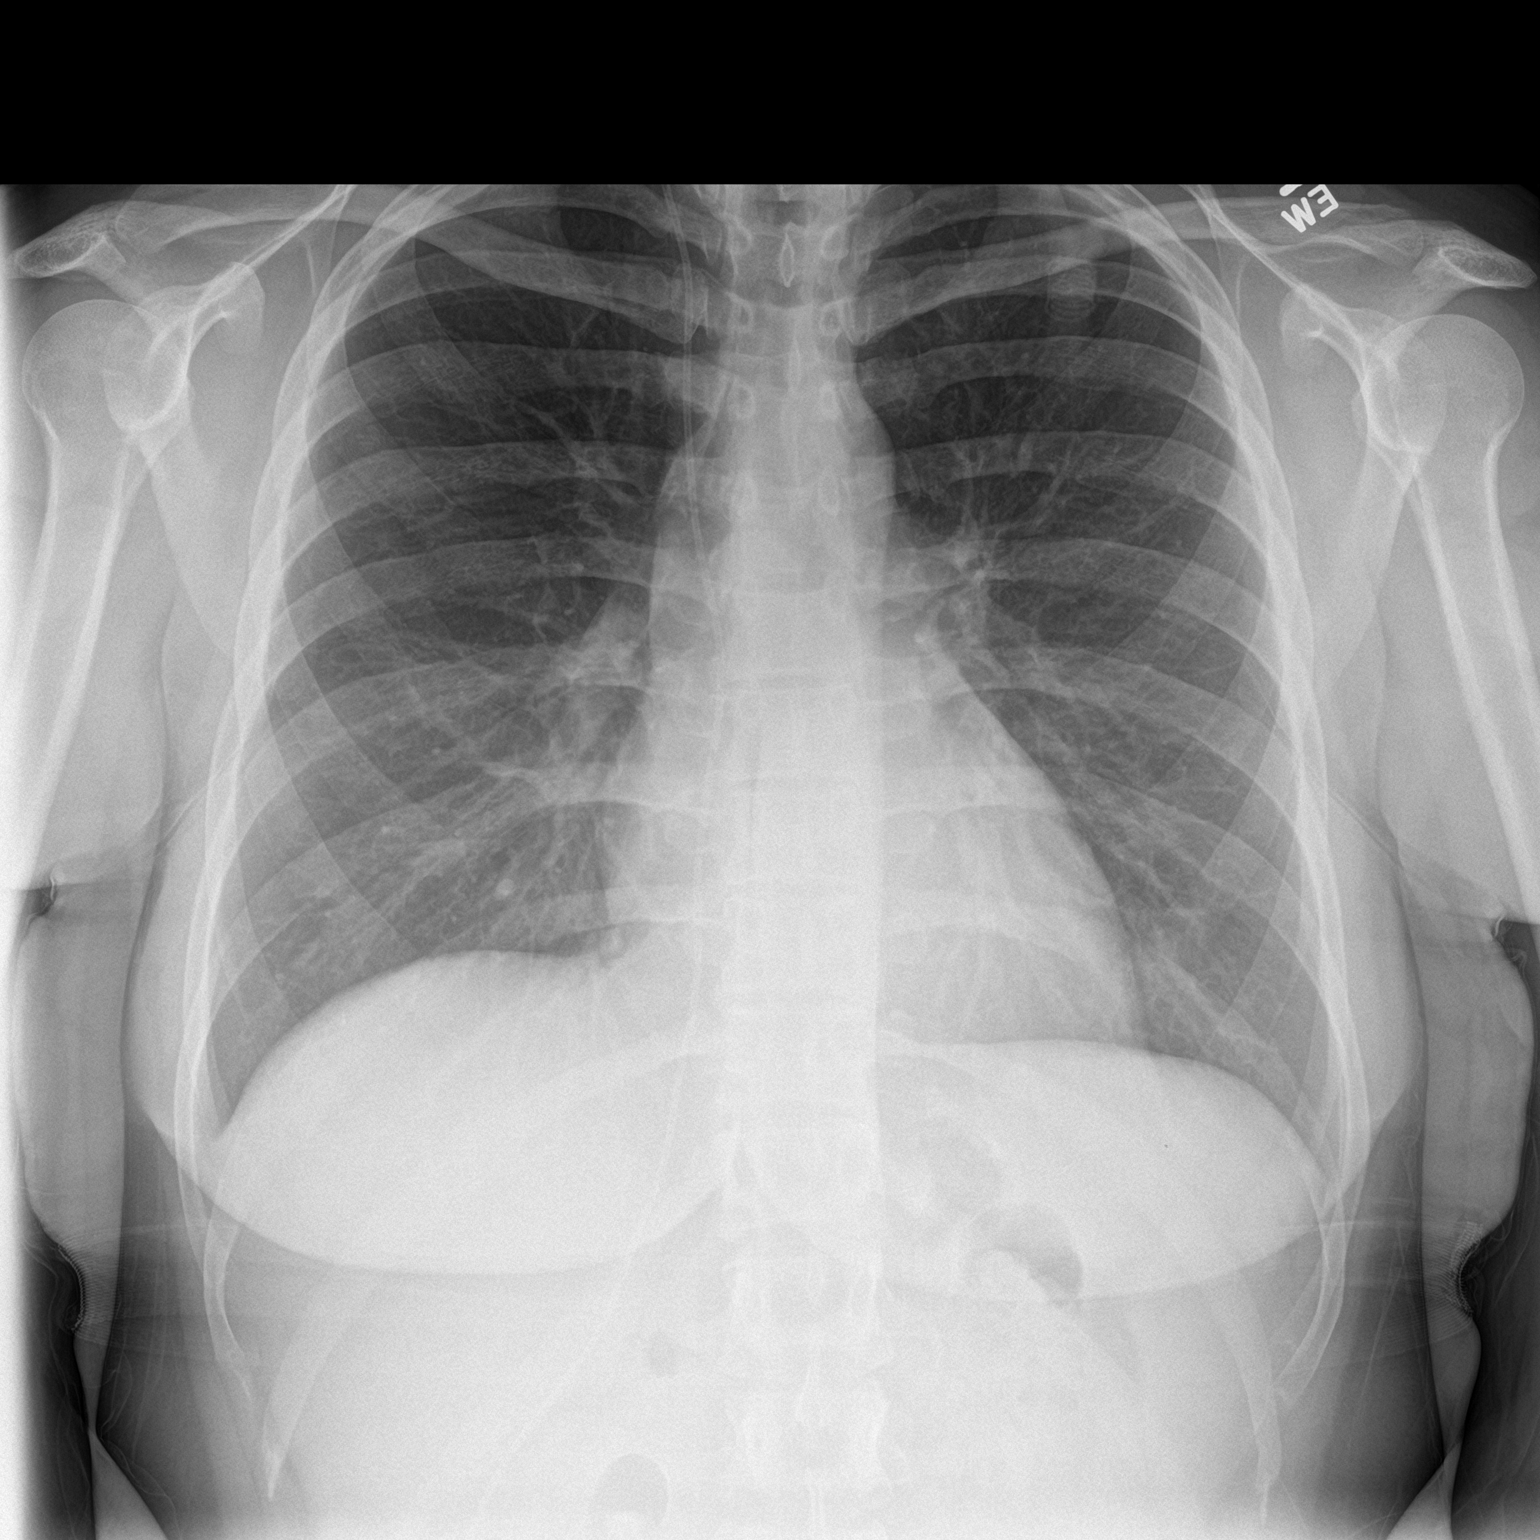

[chest lat]
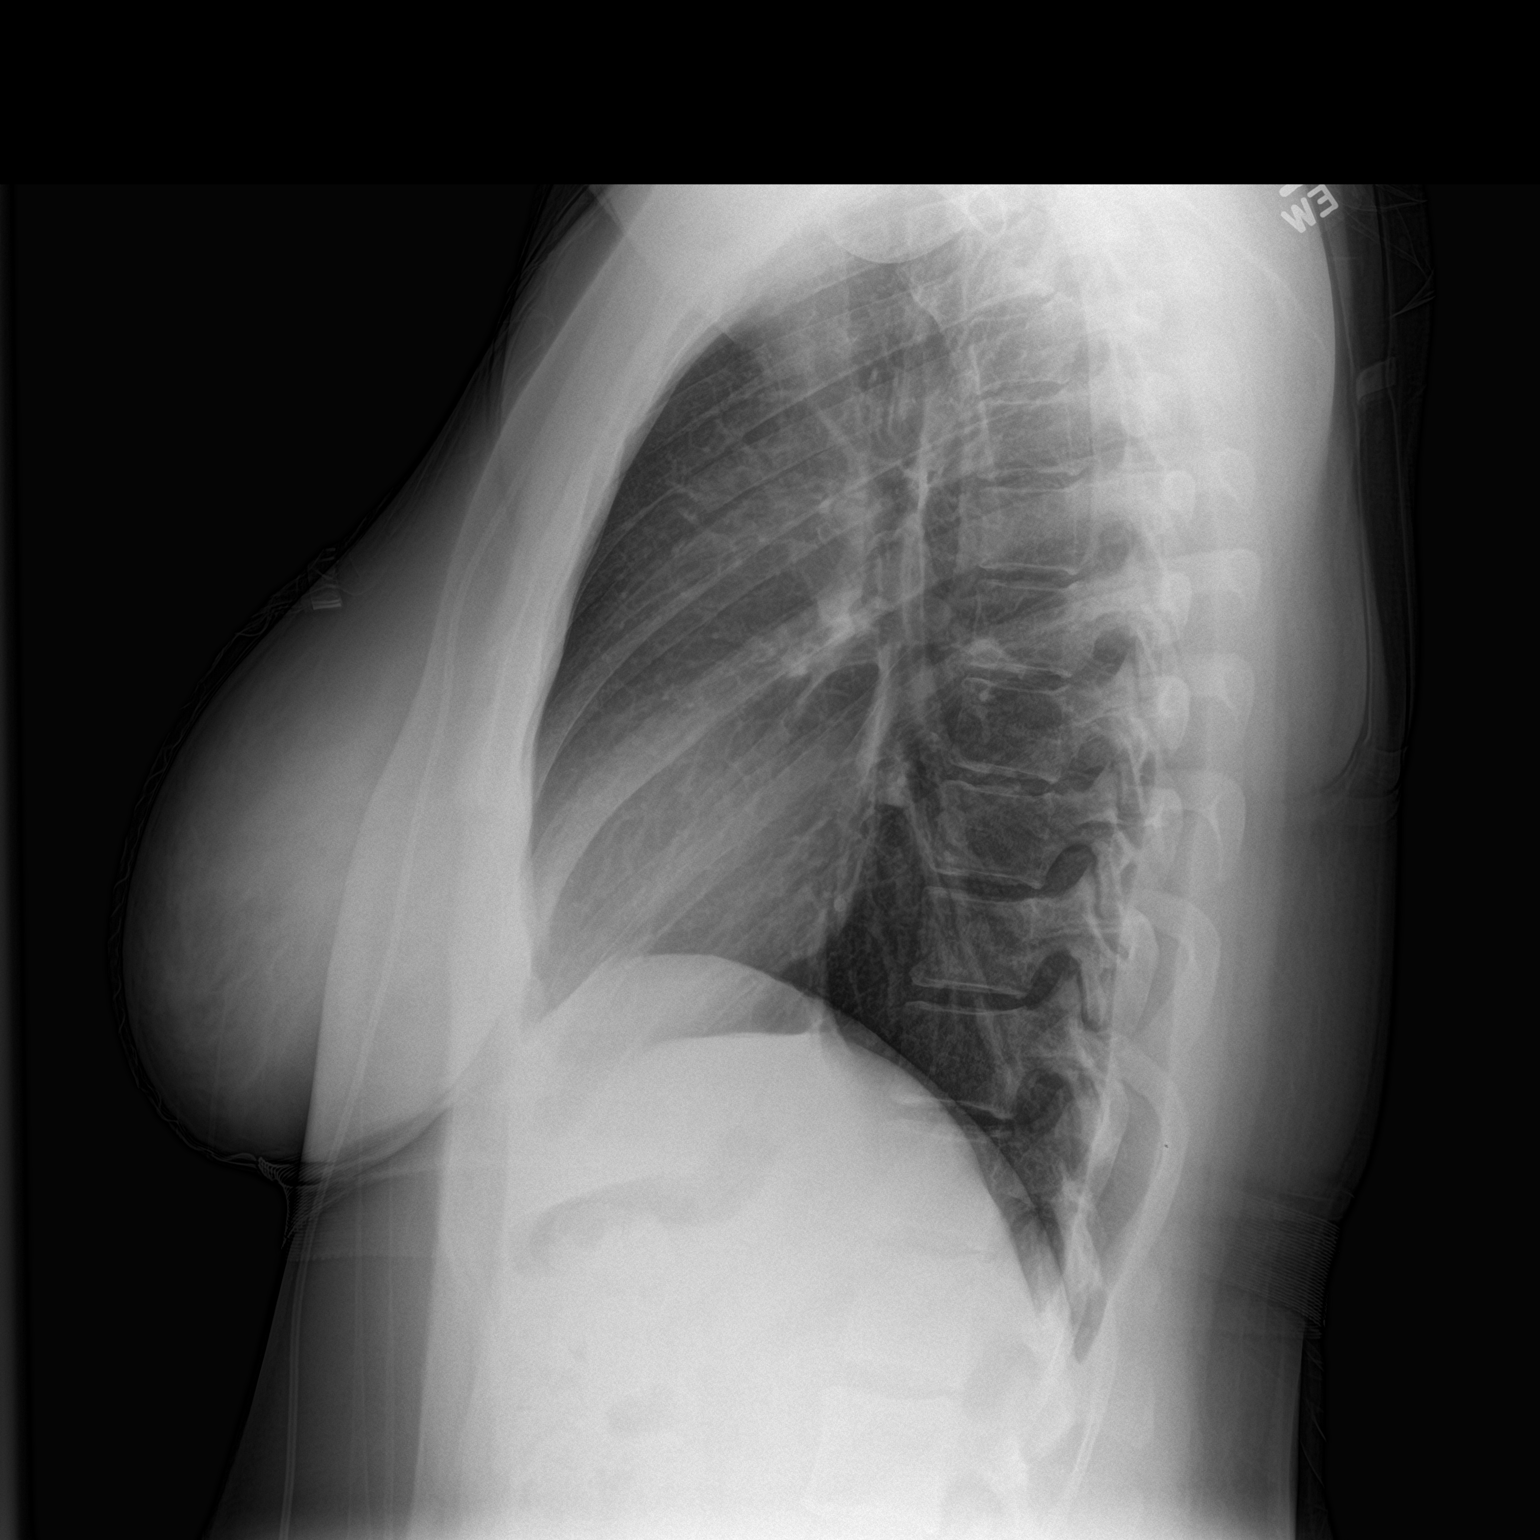

[2 of 2 positions shown; findings below may reference images not displayed]

FINDINGS: The heart size is normal. Mediastinal shadows are normal. The lungs
are clear. No effusions. No bony abnormality. VP shunt tubing seen
passing over the right anterior chest.
IMPRESSION: No active cardiopulmonary disease.

## 2017-05-08 ENCOUNTER — Encounter: Payer: Self-pay | Admitting: Family Medicine

## 2017-05-08 ENCOUNTER — Ambulatory Visit (INDEPENDENT_AMBULATORY_CARE_PROVIDER_SITE_OTHER): Payer: BLUE CROSS/BLUE SHIELD | Admitting: Family Medicine

## 2017-05-08 VITALS — BP 119/81 | HR 83 | Wt 239.0 lb

## 2017-05-08 DIAGNOSIS — F32A Depression, unspecified: Secondary | ICD-10-CM

## 2017-05-08 DIAGNOSIS — F329 Major depressive disorder, single episode, unspecified: Secondary | ICD-10-CM

## 2017-05-08 DIAGNOSIS — G932 Benign intracranial hypertension: Secondary | ICD-10-CM

## 2017-05-08 DIAGNOSIS — F419 Anxiety disorder, unspecified: Secondary | ICD-10-CM

## 2017-05-08 NOTE — Progress Notes (Signed)
Subjective:    Patient ID: Maria Payne, female    DOB: 03/14/1991, 26 y.o.   MRN: [id number]  HPI Follow-up anxiety and depression. She is currently on Lexapro. Last seen in September 2017. She's just been feeling a little overwhelmed. Unfortunately she had to have a new lumbar shunt placed as she was ha difficulty with her ventricular shunt.Now she has 2 shunts in place.she has pseudotumor cerebri. Unfortunately she's continued to have persistent headaches to the point of even vomiting. In fact she had a bad headache this morning and actually vomited. She is now working with a neurologist and they are trying to get one of the newer medications approved to put her on. They also started her on Diamox twice a day to help reduce her pressures. She feels like a lot of his work going on that her mood would actually be a good place. She does complain of little interest or pleasure doing things more than half the days and feeling down more than half the days. She says even her neurosurgeon had encouraged her to speak with a therapist/counselor.   Pseudotumors cerebri-they did hold her birth control for surgery She has not restarted it yet.  Review of Systems  BP 119/81   Pulse 83   Wt 239 lb (108.4 kg)   BMI 36.34 kg/m     Allergies  Allergen Reactions  . Codeine   . Hydrocodone-Acetaminophen Rash    Headaches Morphine, Dilaudid OK  . Oxycodone Rash    Headaches  . Tape Rash    welps    Past Medical History:  Diagnosis Date  . 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 Had diagnostic cerebral arteriogram.     . GAD (generalized anxiety disorder) 04/04/2012  . Papilledema    Diamox  . Pseudotumor cerebri     Past Surgical History:  Procedure Laterality Date  . ANGIOPLASTY  2012   ? cerebral artery   . SLEEVE GASTROPLASTY  05/2014   Dr. Evorn Gong   . Stent placed  August 2014   Stenosis right transverse sigmoid junction,[facility]  . VP shunt  11/2013    Done at [place] History  . Marital status: Married    Spouse name: N/A  . Number of children: 0  . Years of education: N/A   Occupational History  . unemployed.    . CUSTOMER SERVICE [employer]   Social History Main Topics  . Smoking status: Never Smoker  . Smokeless tobacco: Never Used  . Alcohol use No     Comment: occ  . Drug use: No  . Sexual activity: Not Currently    Partners: Male   Other Topics Concern  . Not on file   Social History Narrative   seperated from husband in nov 2014.  Still legally married.     Family History  Problem Relation Age of Onset  . Hypertension Father   . Heart disease Father     Outpatient Encounter Prescriptions as of 05/08/2017  Medication Sig  . acetaZOLAMIDE (DIAMOX) 500 MG capsule Take by mouth.  [name] Kitchen aspirin 81 MG chewable tablet Chew 81 mg by mouth. Chew 2 tablets by mouth two times a day  . escitalopram (LEXAPRO) 20 MG tablet Take 1 tablet (20 mg total) by mouth daily.  . Multiple Vitamins-Minerals (THERA-M) TABS Take 1 tablet by mouth.  . pantoprazole (PROTONIX) 40 MG tablet Take by mouth.  . [DISCONTINUED]  Cholecalciferol (VITAMIN D) 2000 UNITS tablet Take 2,000 Units by mouth.  . [DISCONTINUED] norethindrone-ethinyl estradiol (JUNEL FE,GILDESS FE,LOESTRIN FE) 1-20 MG-MCG tablet Take 1 tablet by mouth daily.   No facility-administered encounter medications on file as of 05/08/2017.          Objective:   Physical Exam  Constitutional: She is oriented to person, place, and time. She appears well-developed and well-nourished.  HENT:  Head: Normocephalic and atraumatic.  Cardiovascular: Normal rate, regular rhythm and normal heart sounds.   Pulmonary/Chest: Effort normal and breath sounds normal.  Neurological: She is alert and oriented to person, place, and time.  Skin: Skin is warm and dry.  Psychiatric: She has a normal mood and affect. Her behavior is normal.        Assessment & Plan:   Depression/anxiety-PHQ 9 score of 18. Though she feels like symptoms are well controlled.  We discussed the situation. I do think a lot of her symptoms are more situational. I can understand what she's been very fresh rated about not getting relief if in after ng new shunts placed for her pseudotumor cerebri. Clearly this is affecting her mood and quality of life. I would like to refer her to a therapist. We'll refer to Melinda Crutch over at [facility] in [facility]. I like to see her back in a couple months after working with her to see if she is improving.  Migraine headaches-now seeing neurology and hopefully will get approved for new medication.  Declined flu vaccine today.  Please schedule Pap smear later this year.   Time spent 30 min, > 50% spent counseling about depression, anxiety , headaches, pseudotumor cerebri

## 2017-06-10 ENCOUNTER — Ambulatory Visit (INDEPENDENT_AMBULATORY_CARE_PROVIDER_SITE_OTHER): Payer: BLUE CROSS/BLUE SHIELD | Admitting: Licensed Clinical Social Worker

## 2017-06-10 DIAGNOSIS — F331 Major depressive disorder, recurrent, moderate: Secondary | ICD-10-CM | POA: Diagnosis not present

## 2017-07-02 ENCOUNTER — Ambulatory Visit (INDEPENDENT_AMBULATORY_CARE_PROVIDER_SITE_OTHER): Payer: BLUE CROSS/BLUE SHIELD | Admitting: Licensed Clinical Social Worker

## 2017-07-02 DIAGNOSIS — F3341 Major depressive disorder, recurrent, in partial remission: Secondary | ICD-10-CM | POA: Diagnosis not present

## 2017-07-24 ENCOUNTER — Ambulatory Visit: Payer: BLUE CROSS/BLUE SHIELD | Admitting: Licensed Clinical Social Worker

## 2017-08-08 ENCOUNTER — Ambulatory Visit: Payer: BLUE CROSS/BLUE SHIELD | Admitting: Licensed Clinical Social Worker

## 2017-08-08 ENCOUNTER — Ambulatory Visit: Payer: Self-pay

## 2017-08-13 ENCOUNTER — Other Ambulatory Visit (HOSPITAL_COMMUNITY)
Admission: RE | Admit: 2017-08-13 | Discharge: 2017-08-13 | Disposition: A | Discharge status: Home / Self Care | Payer: BLUE CROSS/BLUE SHIELD | Source: Ambulatory Visit | Attending: Family Medicine | Admitting: Family Medicine

## 2017-08-13 ENCOUNTER — Encounter: Payer: Self-pay | Admitting: Family Medicine

## 2017-08-13 ENCOUNTER — Ambulatory Visit: Payer: BLUE CROSS/BLUE SHIELD | Admitting: Family Medicine

## 2017-08-13 VITALS — BP 115/78 | HR 87 | Ht 68.0 in | Wt 239.0 lb

## 2017-08-13 DIAGNOSIS — G932 Benign intracranial hypertension: Secondary | ICD-10-CM | POA: Diagnosis not present

## 2017-08-13 DIAGNOSIS — Z23 Encounter for immunization: Secondary | ICD-10-CM

## 2017-08-13 DIAGNOSIS — F321 Major depressive disorder, single episode, moderate: Secondary | ICD-10-CM | POA: Diagnosis not present

## 2017-08-13 DIAGNOSIS — Z124 Encounter for screening for malignant neoplasm of cervix: Secondary | ICD-10-CM | POA: Insufficient documentation

## 2017-08-13 DIAGNOSIS — N760 Acute vaginitis: Secondary | ICD-10-CM

## 2017-08-13 DIAGNOSIS — F411 Generalized anxiety disorder: Secondary | ICD-10-CM

## 2017-08-13 DIAGNOSIS — N898 Other specified noninflammatory disorders of vagina: Secondary | ICD-10-CM

## 2017-08-13 LAB — WET PREP FOR TRICH, YEAST, CLUE
MICRO NUMBER:: 81361527
Specimen Quality: ADEQUATE

## 2017-08-13 MED ORDER — ESCITALOPRAM OXALATE 20 MG PO TABS: 20.0000 mg | ORAL_TABLET | Freq: Every day | ORAL | 1 refills | Status: DC

## 2017-08-13 NOTE — Progress Notes (Signed)
Subjective:    Patient ID: Maria Payne, female    DOB: 1991-02-23, 26 y.o.   MRN: [id number]  HPI 26 year old female comes in today to follow-up for depression and anxiety-she is currently on Lexapro 20 mg and overall actually feels like she is doing much better than she was previously.  She is not having any side effects on the medication and she is currently seeing Almyra Free for therapy which has been very helpful for her.  She has an upcoming appointment soon.  She is also due for her Pap smear.  She has had a little bit of a vaginal odor recently but no other symptoms such as pain or abnormal discharge.  She is about 10 weeks status post shunt surgery for idiopathic intracranial hypertension.  She is now on Aimovig for her headaches.  She was getting about 2/week.   Review of Systems  BP 115/78   Pulse 87   Ht 5\' 8"  (1.727 m)   Wt 239 lb (108.4 kg)   SpO2 99%   BMI 36.34 kg/m     Allergies  Allergen Reactions  . Latex     Other reaction(s): Other Diagnosed with blood test  . Nsaids     Other reaction(s): Other contraindication  . Codeine   . Hydrocodone-Acetaminophen Rash    Headaches Morphine, Dilaudid OK  . Oxycodone Rash    Headaches  . Tape Rash    welps    Past Medical History:  Diagnosis Date  . 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 Had diagnostic cerebral arteriogram.     . GAD (generalized anxiety disorder) 04/04/2012  . Papilledema    Diamox  . Pseudotumor cerebri     Past Surgical History:  Procedure Laterality Date  . ANGIOPLASTY  2012   ? cerebral artery   . SLEEVE GASTROPLASTY  05/2014   Dr. Evorn Gong   . Stent placed  August 2014   Stenosis right transverse sigmoid junction,[facility]  . VP shunt  11/2013   Done at [place] History   Socioeconomic History  . Marital status: Married    Spouse name: Not on file  . Number of children: 0  . Years of education: Not on file  . Highest education level: Not  on file  Social Needs  . Financial resource strain: Not on file  . Food insecurity - worry: Not on file  . Food insecurity - inability: Not on file  . Transportation needs - medical: Not on file  . Transportation needs - non-medical: Not on file  Occupational History  . Occupation: unemployed.   . Occupation: CUSTOMER SERVICE    Employer: [employer]  Tobacco Use  . Smoking status: Never Smoker  . Smokeless tobacco: Never Used  Substance and Sexual Activity  . Alcohol use: No    Alcohol/week: 0.0 oz    Comment: occ  . Drug use: No  . Sexual activity: Not Currently    Partners: Male  Other Topics Concern  . Not on file  Social History Narrative   seperated from husband in nov 2014.  Still legally married.     Family History  Problem Relation Age of Onset  . Hypertension Father   . Heart disease Father     Outpatient Encounter Medications as of 08/13/2017  Medication Sig  . acetaZOLAMIDE (DIAMOX) 500 MG capsule Take by mouth.  [name] Kitchen AIMOVIG 140 DOSE 70 MG/ML SOAJ   . aspirin 81 MG chewable  tablet Chew 81 mg by mouth. Chew 2 tablets by mouth two times a day  . escitalopram (LEXAPRO) 20 MG tablet Take 1 tablet (20 mg total) by mouth daily.  . Multiple Vitamins-Minerals (THERA-M) TABS Take 1 tablet by mouth.  . pantoprazole (PROTONIX) 40 MG tablet Take by mouth.  . [DISCONTINUED] escitalopram (LEXAPRO) 20 MG tablet Take 1 tablet (20 mg total) by mouth daily.   No facility-administered encounter medications on file as of 08/13/2017.         Objective:   Physical Exam  Constitutional: She is oriented to person, place, and time. She appears well-developed and well-nourished.  HENT:  Head: Normocephalic and atraumatic.  Cardiovascular: Normal rate, regular rhythm and normal heart sounds.  Pulmonary/Chest: Effort normal and breath sounds normal.  Genitourinary: Uterus normal. There is no rash or tenderness on the right labia. There is no rash or tenderness on the left labia. Cervix  exhibits no motion tenderness, no discharge and no friability. Right adnexum displays tenderness. Right adnexum displays no mass and no fullness. Left adnexum displays no mass, no tenderness and no fullness. No tenderness in the vagina. Vaginal discharge found.  Genitourinary Comments: White thick vaginal d/c  Neurological: She is alert and oriented to person, place, and time.  Skin: Skin is warm and dry.  Psychiatric: She has a normal mood and affect. Her behavior is normal.       Assessment & Plan:  Depression/anxiety-continue with Lexapro 20 mg in therapy/counseling with Almyra Free.  Follow-up in 3-4 months.  Cervical cancer screening-Pap smear performed today.  Idiopathic intracranial hypertension-status post revision of shunt.  Now on Aimovig and doing better.   Vaginal odor - check wet prep for BV.

## 2017-08-14 ENCOUNTER — Other Ambulatory Visit: Payer: Self-pay | Admitting: Family Medicine

## 2017-08-14 MED ORDER — FLUCONAZOLE 150 MG PO TABS: 150.0000 mg | ORAL_TABLET | Freq: Every day | ORAL | 1 refills | Status: DC

## 2017-08-16 LAB — CYTOLOGY - PAP
Diagnosis: NEGATIVE
HPV: NOT DETECTED

## 2017-08-16 NOTE — Progress Notes (Signed)
Call patient: Your Pap smear is normal. Repeat in 3 years.

## 2017-08-28 ENCOUNTER — Ambulatory Visit (INDEPENDENT_AMBULATORY_CARE_PROVIDER_SITE_OTHER): Payer: BLUE CROSS/BLUE SHIELD | Admitting: Licensed Clinical Social Worker

## 2017-08-28 DIAGNOSIS — F3341 Major depressive disorder, recurrent, in partial remission: Secondary | ICD-10-CM

## 2017-09-13 ENCOUNTER — Other Ambulatory Visit: Payer: Self-pay

## 2017-09-13 ENCOUNTER — Emergency Department
Admission: EM | Admit: 2017-09-13 | Discharge: 2017-09-13 | Disposition: A | Discharge status: Home / Self Care | Payer: BLUE CROSS/BLUE SHIELD | Source: Home / Self Care | Attending: Family Medicine | Admitting: Family Medicine

## 2017-09-13 DIAGNOSIS — B9789 Other viral agents as the cause of diseases classified elsewhere: Secondary | ICD-10-CM | POA: Diagnosis not present

## 2017-09-13 DIAGNOSIS — J4521 Mild intermittent asthma with (acute) exacerbation: Secondary | ICD-10-CM

## 2017-09-13 DIAGNOSIS — J069 Acute upper respiratory infection, unspecified: Secondary | ICD-10-CM

## 2017-09-13 LAB — POCT RAPID STREP A (OFFICE): Rapid Strep A Screen: NEGATIVE

## 2017-09-13 MED ORDER — METHYLPREDNISOLONE SODIUM SUCC 125 MG IJ SOLR
80.0000 mg | Freq: Once | INTRAMUSCULAR | Status: AC
  Administered 2017-09-13: 80 mg via INTRAMUSCULAR

## 2017-09-13 MED ORDER — AMOXICILLIN 875 MG PO TABS: 875.0000 mg | ORAL_TABLET | Freq: Two times a day (BID) | ORAL | 0 refills | Status: DC

## 2017-09-13 MED ORDER — BENZONATATE 200 MG PO CAPS: ORAL_CAPSULE | ORAL | 0 refills | Status: DC

## 2017-09-13 MED ORDER — ALBUTEROL SULFATE HFA 108 (90 BASE) MCG/ACT IN AERS: 2.0000 | INHALATION_SPRAY | RESPIRATORY_TRACT | 1 refills | Status: DC | PRN

## 2017-09-13 MED ORDER — PREDNISONE 20 MG PO TABS: ORAL_TABLET | ORAL | 0 refills | Status: DC

## 2017-09-13 NOTE — Discharge Instructions (Signed)
Begin prednisone Saturday 09/14/17. Continue plain guaifenesin 400mg  every 4 hours, with plenty of water, for cough and congestion.  May add Pseudoephedrine (30mg , one or two every 4 to 6 hours) for sinus congestion.  Get adequate rest.   May use Afrin nasal spray (or generic oxymetazoline) each morning for about 5 days and then discontinue.  Also recommend using saline nasal spray several times daily and saline nasal irrigation (AYR is a common brand).  Use Flonase nasal spray each morning after using Afrin nasal spray and saline nasal irrigation. Try warm salt water gargles for sore throat.  Stop all antihistamines for now, and other non-prescription cough/cold preparations. Begin Amoxicillin if not improving about one week or if persistent fever develops   Follow-up with family doctor if not improving about10 days.

## 2017-09-13 NOTE — ED Triage Notes (Signed)
Started 2 days ago with a scratchy throat.  Right ear is more irritated that left.  Also has chest tightness.  Denies drainage.

## 2017-09-13 NOTE — ED Provider Notes (Signed)
Maria Payne CARE    CSN: [id number] Arrival date & time: 09/13/17  1348     History   Chief Complaint Chief Complaint  Patient presents with  . Sore Throat  . Cough  . Otalgia    HPI Maria Payne is a 27 y.o. female.   Patient complains of two day history of typical cold-like symptoms including mild sore throat, hoarseness, sinus congestion, fatigue, myalgias, and cough.  She complains of tightness in her anterior chest, and has used her nebulizer with albuterol.   The history is provided by the patient.    Past Medical History:  Diagnosis Date  . 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 Had diagnostic cerebral arteriogram.     . GAD (generalized anxiety disorder) 04/04/2012  . Papilledema    Diamox  . Pseudotumor cerebri     Patient Active Problem List   Diagnosis Date Noted  . MDD (major depressive disorder) 04/04/2012  . GAD (generalized anxiety disorder) 04/04/2012  . 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 IIH (idiopathic intracranial hypertension) 09/10/2011  . DERMATITIS, PERIORAL 05/17/2010    Past Surgical History:  Procedure Laterality Date  . ANGIOPLASTY  2012   ? cerebral artery   . SLEEVE GASTROPLASTY  05/2014   Dr. Evorn Gong   . Stent placed  August 2014   Stenosis right transverse sigmoid junction,[facility]  . 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 Authorizing Provider  acetaZOLAMIDE (DIAMOX) 500 MG capsule Take by mouth. 12/17/16 03/14/18  [provider]  AIMOVIG 140 DOSE 70 MG/ML SOAJ  08/07/17   [provider]  albuterol (PROVENTIL HFA;VENTOLIN HFA) 108 (90 Base) MCG/ACT inhaler Inhale 2 puffs into the lungs every 4 (four) hours as needed for wheezing or shortness of breath. 09/13/17   Kandra Nicolas, MD  amoxicillin (AMOXIL) 875 MG tablet Take 1  tablet (875 mg total) by mouth 2 (two) times daily. (Rx void after 09/21/17) 09/13/17   Kandra Nicolas, MD  aspirin 81 MG chewable tablet Chew 81 mg by mouth. Chew 2 tablets by mouth two times a day    [provider]  benzonatate (TESSALON) 200 MG capsule Take one cap by mouth at bedtime as needed for cough.  May repeat in 4 to 6 hours 09/13/17   Kandra Nicolas, MD  escitalopram (LEXAPRO) 20 MG tablet Take 1 tablet (20 mg total) by mouth daily. 08/13/17   Hali Marry, MD  fluconazole (DIFLUCAN) 150 MG tablet Take 1 tablet (150 mg total) by mouth daily. 08/14/17   Hali Marry, MD  Multiple Vitamins-Minerals (THERA-M) TABS Take 1 tablet by mouth. 04/19/14   [provider]  pantoprazole (PROTONIX) 40 MG tablet Take by mouth. 11/26/16 11/26/17  [provider]  predniSONE (DELTASONE) 20 MG tablet Take one tab by mouth twice daily for 4 days, then one daily for 3 days. Take with food. 09/13/17   Kandra Nicolas, MD    Family History Family History  Problem Relation Age of Onset  . Hypertension Father   . Heart disease Father     Social History Social History   Tobacco Use  . Smoking status: Never Smoker  . Smokeless tobacco: Never Used  Substance Use Topics  .  Alcohol use: No    Alcohol/week: 0.0 oz    Comment: occ  . Drug use: No     Allergies   Latex; Nsaids; Codeine; Hydrocodone-acetaminophen; Oxycodone; and Tape   Review of Systems Review of Systems + sore throat + hoarse + cough No pleuritic pain No wheezing + nasal congestion + post-nasal drainage No sinus pain/pressure No itchy/red eyes ? earache No hemoptysis No SOB No fever, + chills No nausea No vomiting No abdominal pain No diarrhea No urinary symptoms No skin rash + fatigue + myalgias + headache Used OTC meds without relief   Physical Exam Triage Vital Signs ED Triage Vitals  Enc Vitals Group     BP 09/13/17 1430 124/86     Pulse Rate 09/13/17 1430 97       Resp --      Temp 09/13/17 1430 98.4 F (36.9 C)     Temp Source 09/13/17 1430 Oral     SpO2 09/13/17 1430 99 %     Weight 09/13/17 1430 246 lb (111.6 kg)     Height 09/13/17 1430 5\' 8"  (1.727 m)     Head Circumference --      Peak Flow --      Pain Score 09/13/17 1431 0     Pain Loc --      Pain Edu? --      Excl. in [place]? --    No data found.  Updated Vital Signs BP 124/86 (BP Location: Right Arm)   Pulse 97   Temp 98.4 F (36.9 C) (Oral)   Ht 5\' 8"  (1.727 m)   Wt 246 lb (111.6 kg)   LMP 08/15/2017 (Approximate)   SpO2 99%   BMI 37.40 kg/m   Visual Acuity Right Eye Distance:   Left Eye Distance:   Bilateral Distance:    Right Eye Near:   Left Eye Near:    Bilateral Near:     Physical Exam Nursing notes and Vital Signs reviewed. Appearance:  Patient appears stated age, and in no acute distress Eyes:  Pupils are equal, round, and reactive to light and accomodation.  Extraocular movement is intact.  Conjunctivae are not inflamed  Ears:  Canals normal.  Tympanic membranes normal.  Nose:  Mildly congested turbinates.  No sinus tenderness. Pharynx:  Normal Neck:  Supple.  Enlarged posterior/lateral nodes are palpated bilaterally, tender to palpation on the left.   Lungs:  Clear to auscultation.  Breath sounds are equal.  Moving air well. Chest:  Distinct tenderness to palpation over the mid-sternum.  Heart:  Regular rate and rhythm without murmurs, rubs, or gallops.  Abdomen:  Nontender without masses or hepatosplenomegaly.  Bowel sounds are present.  No CVA or flank tenderness.  Extremities:  No edema.  Skin:  No rash present.    UC Treatments / Results  Labs (all labs ordered are listed, but only abnormal results are displayed) Labs Reviewed  POCT RAPID STREP A (OFFICE) negative    EKG  EKG Interpretation None       Radiology No results found.  Procedures Procedures (including critical care time)  Medications Ordered in UC Medications   methylPREDNISolone sodium succinate (SOLU-MEDROL) 125 mg/2 mL injection 80 mg (not administered)     Initial Impression / Assessment and Plan / UC Course  I have reviewed the triage vital signs and the nursing notes.  Pertinent labs & imaging results that were available during my care of the patient were reviewed by me and considered in  my medical decision making (see chart for details).    There is no evidence of bacterial infection today.   Administered Solumedrol 80mg  IM. Prescription written for [facility]) to take at bedtime for night-time cough.  Refill albuterol inhaler. Begin prednisone burst/taper Saturday 09/14/17. Continue plain guaifenesin 400mg  every 4 hours, with plenty of water, for cough and congestion.  May add Pseudoephedrine (30mg , one or two every 4 to 6 hours) for sinus congestion.  Get adequate rest.   May use Afrin nasal spray (or generic oxymetazoline) each morning for about 5 days and then discontinue.  Also recommend using saline nasal spray several times daily and saline nasal irrigation (AYR is a common brand).  Use Flonase nasal spray each morning after using Afrin nasal spray and saline nasal irrigation. Try warm salt water gargles for sore throat.  Stop all antihistamines for now, and other non-prescription cough/cold preparations. Begin Amoxicillin if not improving about one week or if persistent fever develops (Given a prescription to hold, with an expiration date)  Follow-up with family doctor if not improving about10 days.     Final Clinical Impressions(s) / UC Diagnoses   Final diagnoses:  Viral URI with cough  Mild intermittent asthma with acute exacerbation    ED Discharge Orders        Ordered    predniSONE (DELTASONE) 20 MG tablet     09/13/17 1510    benzonatate (TESSALON) 200 MG capsule     09/13/17 1510    albuterol (PROVENTIL HFA;VENTOLIN HFA) 108 (90 Base) MCG/ACT inhaler  Every 4 hours PRN     09/13/17 1510    amoxicillin  (AMOXIL) 875 MG tablet  2 times daily     09/13/17 1511          Kandra Nicolas, MD 09/16/17 2300

## 2017-09-15 ENCOUNTER — Telehealth: Payer: Self-pay | Admitting: Emergency Medicine

## 2017-09-15 NOTE — Telephone Encounter (Signed)
Courtesy call to patient.States she is feeling better advised to CB with questions or concerns. AP, CMA

## 2017-10-02 ENCOUNTER — Ambulatory Visit (INDEPENDENT_AMBULATORY_CARE_PROVIDER_SITE_OTHER): Payer: BLUE CROSS/BLUE SHIELD | Admitting: Licensed Clinical Social Worker

## 2017-10-02 ENCOUNTER — Telehealth: Payer: Self-pay | Admitting: Family Medicine

## 2017-10-02 DIAGNOSIS — F3341 Major depressive disorder, recurrent, in partial remission: Secondary | ICD-10-CM

## 2017-10-03 MED ORDER — NORETHINDRONE ACET-ETHINYL EST 1-20 MG-MCG PO TABS: 1.0000 | ORAL_TABLET | Freq: Every day | ORAL | 3 refills | Status: DC

## 2017-10-03 NOTE — Telephone Encounter (Signed)
OK to refill Junel for 4 monhts

## 2017-10-03 NOTE — Telephone Encounter (Signed)
Pt called for update on this. Unsure if it was routed to PCP properly. Pt states she doesn't have the money to come in for an appointment, but she would like to be restarted on Junel. She currently is not taking anything to help with her acne, but now she needs to restart. Her acne is flaring up again after her surgery. She would like this sent to the [facility] in [place].

## 2017-10-04 NOTE — Telephone Encounter (Signed)
Pt advised.

## 2017-11-04 ENCOUNTER — Ambulatory Visit: Payer: BLUE CROSS/BLUE SHIELD | Admitting: Family Medicine

## 2017-11-04 ENCOUNTER — Encounter: Payer: Self-pay | Admitting: Family Medicine

## 2017-11-04 VITALS — BP 136/75 | HR 92 | Ht 68.0 in | Wt 240.0 lb

## 2017-11-04 DIAGNOSIS — L700 Acne vulgaris: Secondary | ICD-10-CM

## 2017-11-04 DIAGNOSIS — N760 Acute vaginitis: Secondary | ICD-10-CM | POA: Diagnosis not present

## 2017-11-04 DIAGNOSIS — Z833 Family history of diabetes mellitus: Secondary | ICD-10-CM

## 2017-11-04 LAB — POCT GLYCOSYLATED HEMOGLOBIN (HGB A1C): Hemoglobin A1C: 5.3

## 2017-11-04 NOTE — Progress Notes (Signed)
Subjective:    Patient ID: Maria Payne, female    DOB: 04-Jun-1991, 27 y.o.   MRN: [id number]  HPI pt stated that the current birth control is not really helping with her acne and she has recently become sexually active.  She feels like the birth control has helped a little bit with the acne on her face but not her shoulders or her back.    pt reports that she began taking a probiotic called yeast gaurd but noticed that she tends to have yeast infections around the time of her cycle.  She does feel like the yeast guard has been helpful.  But she will get irritation and itching right around the time that she ovulates.  She does wear tampons while she is having her period but says she changes them frequently.  Review of Systems  BP 136/75   Pulse 92   Ht 5\' 8"  (1.727 m)   Wt 240 lb (108.9 kg)   SpO2 96%   BMI 36.49 kg/m     Allergies  Allergen Reactions  . Latex     Other reaction(s): Other Diagnosed with blood test  . Nsaids     Other reaction(s): Other contraindication  . Codeine   . Hydrocodone-Acetaminophen Rash    Headaches Morphine, Dilaudid OK  . Oxycodone Rash    Headaches  . Tape Rash    welps    Past Medical History:  Diagnosis Date  . 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 Had diagnostic cerebral arteriogram.     . GAD (generalized anxiety disorder) 04/04/2012  . Papilledema    Diamox  . Pseudotumor cerebri     Past Surgical History:  Procedure Laterality Date  . ANGIOPLASTY  2012   ? cerebral artery   . SLEEVE GASTROPLASTY  05/2014   Dr. Evorn Gong   . Stent placed  August 2014   Stenosis right transverse sigmoid junction,[facility]  . 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 Marital status: Married    Spouse name: Not on file  . Number of children: 0  . Years of education: Not on file  . Highest education level: Not on file  Social Needs  . Financial resource strain: Not on  file  . Food insecurity - worry: Not on file  . Food insecurity - inability: Not on file  . Transportation needs - medical: Not on file  . Transportation needs - non-medical: Not on file  Occupational History  . Occupation: unemployed.   . Occupation: CUSTOMER SERVICE    Employer: [employer]  Tobacco Use  . Smoking status: Never Smoker  . Smokeless tobacco: Never Used  Substance and Sexual Activity  . Alcohol use: No    Alcohol/week: 0.0 oz    Comment: occ  . Drug use: No  . Sexual activity: Not Currently    Partners: Male  Other Topics Concern  . Not on file  Social History Narrative   seperated from husband in nov 2014.  Still legally married.     Family History  Problem Relation Age of Onset  . Hypertension Father   . Heart disease Father     Outpatient Encounter Medications as of 11/04/2017  Medication Sig  . acetaZOLAMIDE (DIAMOX) 500 MG capsule Take by mouth.  [name] Kitchen AIMOVIG 140 DOSE 70 MG/ML SOAJ   . albuterol (PROVENTIL HFA;VENTOLIN HFA) 108 (90 Base) MCG/ACT inhaler Inhale 2  puffs into the lungs every 4 (four) hours as needed for wheezing or shortness of breath.  . AMBULATORY NON FORMULARY MEDICATION Medication Name: Probiotic; Yeast guard  . aspirin 81 MG chewable tablet Chew 81 mg by mouth. Chew 2 tablets by mouth two times a day  . escitalopram (LEXAPRO) 20 MG tablet Take 1 tablet (20 mg total) by mouth daily.  . Multiple Vitamins-Minerals (THERA-M) TABS Take 1 tablet by mouth.  . norethindrone-ethinyl estradiol (JUNEL 1/20) 1-20 MG-MCG tablet Take 1 tablet by mouth daily.  . pantoprazole (PROTONIX) 40 MG tablet Take by mouth.  . [DISCONTINUED] amoxicillin (AMOXIL) 875 MG tablet Take 1 tablet (875 mg total) by mouth 2 (two) times daily. (Rx void after 09/21/17)   No facility-administered encounter medications on file as of 11/04/2017.          Objective:   Physical Exam  Constitutional: She is oriented to person, place, and time. She appears well-developed and  well-nourished.  HENT:  Head: Normocephalic and atraumatic.  Cardiovascular: Normal rate, regular rhythm and normal heart sounds.  Pulmonary/Chest: Effort normal and breath sounds normal.  Neurological: She is alert and oriented to person, place, and time.  Skin: Skin is warm and dry.  Cystic acne with excoriations  Psychiatric: She has a normal mood and affect. Her behavior is normal.          Assessment & Plan:  Cystic acne-primarily on shoulders and back and face-we discussed options.  Putting her on a low-dose antibiotic can interfere with her birth control.  We could consider doing a topical but she is not going to be able to effectively apply it to her upper back on her own.  We also discussed referring her to dermatology for Accutane.  She says her sister was actually on Accutane and I think this would be the best option.  Recurrent yeast infections- will test for diabetes since has a family history of it.  Overall we may need to look at something more long-term for treatment.  She is not currently trying to get pregnant.  She is on birth control and is currently sexually active. Can tx with difluca 150mg  Q3 days for 3 doses and then once a week for 6 months.

## 2017-11-05 ENCOUNTER — Other Ambulatory Visit: Payer: Self-pay | Admitting: Family Medicine

## 2017-11-05 LAB — WET PREP FOR TRICH, YEAST, CLUE
MICRO NUMBER:: 90244479
Specimen Quality: ADEQUATE

## 2017-11-05 MED ORDER — METRONIDAZOLE 500 MG PO TABS
500.0000 mg | ORAL_TABLET | Freq: Two times a day (BID) | ORAL | 0 refills | Status: DC
Start: 2017-11-05 — End: 2018-02-24

## 2017-11-05 NOTE — Progress Notes (Signed)
rx sent for metro for BV

## 2017-11-14 ENCOUNTER — Ambulatory Visit: Payer: Self-pay | Admitting: Family Medicine

## 2017-11-19 ENCOUNTER — Ambulatory Visit: Payer: BLUE CROSS/BLUE SHIELD | Admitting: Obstetrics and Gynecology

## 2017-11-19 ENCOUNTER — Encounter: Payer: Self-pay | Admitting: Obstetrics and Gynecology

## 2017-11-19 VITALS — BP 135/85 | HR 93 | Wt 241.0 lb

## 2017-11-19 DIAGNOSIS — N926 Irregular menstruation, unspecified: Secondary | ICD-10-CM | POA: Diagnosis not present

## 2017-11-19 DIAGNOSIS — N750 Cyst of Bartholin's gland: Secondary | ICD-10-CM | POA: Diagnosis not present

## 2017-11-19 LAB — POCT URINE PREGNANCY: Preg Test, Ur: NEGATIVE

## 2017-11-19 MED ORDER — CEPHALEXIN 500 MG PO CAPS: 500.0000 mg | ORAL_CAPSULE | Freq: Four times a day (QID) | ORAL | 0 refills | Status: DC

## 2017-11-19 NOTE — Patient Instructions (Signed)
Bartholin Cyst or Abscess A Bartholin cyst is a fluid-filled sac that forms on a Bartholin gland. Bartholin glands are small glands that are located within the folds of skin (labia) along the sides of the lower opening of the vagina. These glands produce a fluid to moisten the outside of the vagina during sexual intercourse. A Bartholin cyst causes a bulge on the side of the vagina. A cyst that is not large or infected may not cause symptoms or problems. However, if the fluid within the cyst becomes infected, the cyst can turn into an abscess. An abscess may cause discomfort or pain. What are the causes? A Bartholin cyst may develop when the duct of the gland becomes blocked. In many cases, the cause of this is not known. Various kinds of bacteria can cause the cyst to become infected and develop into an abscess. What increases the risk? You may be at an increased risk of developing a Bartholin cyst or abscess if:  You are a woman of reproductive age.  You have a history of previous Bartholin cysts or abscesses.  You have diabetes.  You have a sexually transmitted disease (STD).  What are the signs or symptoms? The severity of symptoms varies depending on the size of the cyst and whether it is infected. Symptoms may include:  A bulge or swelling near the lower opening of your vagina.  Discomfort or pain.  Redness.  Pain during sexual intercourse.  Pain when walking.  Fluid draining from the area.  How is this diagnosed? Your health care provider may make a diagnosis based on your symptoms and a physical exam. He or she will look for swelling in your vaginal area. Blood tests may be done to check for infections. A sample of fluid from the cyst or abscess may also be taken to be tested in a lab. How is this treated? Small cysts that are not infected may not require any treatment. These often go away on their own. Yourhealth care provider will recommend hot baths and the use of warm  compresses. These may also be part of the treatment for an abscess. Treatment options for a large cyst or abscess may include:  Antibiotic medicine.  A surgical procedure to drain the abscess. One of the following procedures may be done: ? Incision and drainage. An incision is made in the cyst or abscess so that the fluid drains out. A catheter may be placed inside the cyst so that it does not close and fill up with fluid again. The catheter will be removed after you have a follow-up visit with a specialist (gynecologist). ? Marsupialization. The cyst or abscess is opened and kept open by stitching the edges of the skin to the walls of the cyst or abscess. This allows it to continue to drain and not fill up with fluid again.  If you have cysts or abscesses that keep returning and have required incision and drainage multiple times, your health care provider may talk to you about surgery to remove the Bartholin gland. Follow these instructions at home:  Take medicines only as directed by your health care provider.  If you were prescribed an antibiotic medicine, finish it all even if you start to feel better.  Apply warm, wet compresses to the area or take warm, shallow baths that cover your pelvic region (sitz baths) several times a day or as directed by your health care provider.  Do not squeeze the cyst or apply heavy pressure to it.    Do not have sexual intercourse until the cyst has gone away.  If your cyst or abscess was opened, a small piece of gauze or a drain may have been placed in the area to allow drainage. Do not remove the gauze or the drain until directed by your health care provider.  Wear feminine pads-not tampons-as needed for any drainage or bleeding.  Keep all follow-up visits as directed by your health care provider. This is important. How is this prevented? Take these steps to help prevent a Bartholin cyst from returning:  Practice good hygiene.  Clean your vaginal  area with mild soap and a soft cloth when you bathe.  Practice safe sex to prevent STDs.  Contact a health care provider if:  You have increased pain, swelling, or redness in the area of the cyst.  Puslike drainage is coming from the cyst.  You have a fever. This information is not intended to replace advice given to you by your health care provider. Make sure you discuss any questions you have with your health care provider. Document Released: 08/27/2005 Document Revised: 02/02/2016 Document Reviewed: 04/12/2014 Elsevier Interactive Patient Education  2018 Elsevier Inc.  

## 2017-11-19 NOTE — Progress Notes (Signed)
27 yo G0 here for the evaluation of a right vaginal cyst. Patient had a right bartholin abscess in the past which was drained in the office. She states noticing this one last week. It increased in size gradually and spontaneously started to drain yesterday. She reports seeing purulent discharge on her underwear. She reports some significant improvement in her pain. Patient has also been using Junel for contraception and to help with her acne. She started her second pack and reports being due for a period 2 weeks ago. She is sexually active.  Past Medical History:  Diagnosis Date  . 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 Had diagnostic cerebral arteriogram.     . GAD (generalized anxiety disorder) 04/04/2012  . Papilledema    Diamox  . Pseudotumor cerebri    Past Surgical History:  Procedure Laterality Date  . ANGIOPLASTY  2012   ? cerebral artery   . SLEEVE GASTROPLASTY  05/2014   Dr. Evorn Gong   . Stent placed  August 2014   Stenosis right transverse sigmoid junction,[facility]  . 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 Hypertension Father   . Heart disease Father    Social History   Tobacco Use  . Smoking status: Never Smoker  . Smokeless tobacco: Never Used  Substance Use Topics  . Alcohol use: No    Alcohol/week: 0.0 oz    Comment: occ  . Drug use: No   ROS See pertinent in HPI  Blood pressure 135/85, pulse 93, weight 241 lb (109.3 kg), last menstrual period 10/11/2017. GENERAL: Well-developed, well-nourished female in no acute distress.  ABDOMEN: Soft, nontender, nondistended. No organomegaly. PELVIC: Normal external female genitalia. Tenderness in right bartholin region. No palpable fluctuance or abscess. Vagina is pink and rugated.  Normal discharge. Normal appearing cervix. Uterus is normal in size. No adnexal mass or tenderness. EXTREMITIES: No cyanosis, clubbing, or edema, 2+ distal  pulses.  UPT negative  A/P 27 yo with right bartholin abscess and late menses - Advised to perform sitz bath and apply warm compresses to the area for the next 2 weeks - Rx Keflex provided - Advised to monitor menses and to contact us if persistent amenorrhea next month. We may need to change birth control - She may repeat pregnancy test

## 2017-11-19 NOTE — Progress Notes (Signed)
Vaginal Lesion/ period is 2 weeks late- UPT negative

## 2017-11-20 ENCOUNTER — Ambulatory Visit (INDEPENDENT_AMBULATORY_CARE_PROVIDER_SITE_OTHER): Payer: BLUE CROSS/BLUE SHIELD | Admitting: Licensed Clinical Social Worker

## 2017-11-20 DIAGNOSIS — F3341 Major depressive disorder, recurrent, in partial remission: Secondary | ICD-10-CM

## 2017-11-21 ENCOUNTER — Ambulatory Visit: Payer: Self-pay | Admitting: Family Medicine

## 2017-12-18 ENCOUNTER — Ambulatory Visit: Payer: BLUE CROSS/BLUE SHIELD | Admitting: Licensed Clinical Social Worker

## 2017-12-23 ENCOUNTER — Other Ambulatory Visit: Payer: Self-pay | Admitting: Family Medicine

## 2017-12-26 ENCOUNTER — Ambulatory Visit: Payer: BLUE CROSS/BLUE SHIELD | Admitting: Licensed Clinical Social Worker

## 2018-01-02 ENCOUNTER — Ambulatory Visit (INDEPENDENT_AMBULATORY_CARE_PROVIDER_SITE_OTHER): Payer: BLUE CROSS/BLUE SHIELD | Admitting: Licensed Clinical Social Worker

## 2018-01-02 DIAGNOSIS — F3341 Major depressive disorder, recurrent, in partial remission: Secondary | ICD-10-CM | POA: Diagnosis not present

## 2018-01-13 ENCOUNTER — Ambulatory Visit: Payer: BLUE CROSS/BLUE SHIELD | Admitting: Licensed Clinical Social Worker

## 2018-01-21 ENCOUNTER — Emergency Department
Admission: EM | Admit: 2018-01-21 | Discharge: 2018-01-21 | Disposition: A | Discharge status: Home / Self Care | Payer: BLUE CROSS/BLUE SHIELD | Source: Home / Self Care | Attending: Family Medicine | Admitting: Family Medicine

## 2018-01-21 ENCOUNTER — Other Ambulatory Visit: Payer: Self-pay

## 2018-01-21 DIAGNOSIS — J029 Acute pharyngitis, unspecified: Secondary | ICD-10-CM | POA: Diagnosis not present

## 2018-01-21 DIAGNOSIS — Z20818 Contact with and (suspected) exposure to other bacterial communicable diseases: Secondary | ICD-10-CM

## 2018-01-21 LAB — POCT RAPID STREP A (OFFICE): Rapid Strep A Screen: NEGATIVE

## 2018-01-21 NOTE — Discharge Instructions (Signed)
°  You may take 500mg acetaminophen every 4-6 hours or in combination with ibuprofen 400-600mg every 6-8 hours as needed for pain, inflammation, and fever. ° °Be sure to drink at least eight 8oz glasses of water to stay well hydrated and get at least 8 hours of sleep at night, preferably more while sick.  ° °

## 2018-01-21 NOTE — ED Triage Notes (Signed)
Sore throat started yesterday, with head pressure and neck pain.  Boyfriend dx with strep yesterday.

## 2018-01-21 NOTE — ED Provider Notes (Signed)
[name]    CSN: [id number] Arrival date & time: 01/21/18  1116     History   Chief Complaint Chief Complaint  Patient presents with  . Sore Throat    HPI Maria Payne is a 27 y.o. female.   HPI Maria Payne is a 27 y.o. female presenting to UC with c/o sore throat with head and neck pressure since yesterday.  She notes her boyfriend was dx with strep throat yesterday.  Pain is 3-4/10 at this time. She has been able to eat and drink without difficulty. Denies fever, chills, n/v/d.    Past Medical History:  Diagnosis Date  . 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 Had diagnostic cerebral arteriogram.     . GAD (generalized anxiety disorder) 04/04/2012  . Papilledema    Diamox  . Pseudotumor cerebri     Patient Active Problem List   Diagnosis Date Noted  . MDD (major depressive disorder) 04/04/2012  . GAD (generalized anxiety disorder) 04/04/2012  . 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 IIH (idiopathic intracranial hypertension) 09/10/2011  . DERMATITIS, PERIORAL 05/17/2010    Past Surgical History:  Procedure Laterality Date  . ANGIOPLASTY  2012   ? cerebral artery   . SLEEVE GASTROPLASTY  05/2014   Dr. Evorn Gong   . Stent placed  August 2014   Stenosis right transverse sigmoid junction,[facility]  . 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 Authorizing Provider  acetaZOLAMIDE (DIAMOX) 500 MG capsule Take by mouth. 12/17/16 03/14/18  [provider]  AIMOVIG 140 DOSE 70 MG/ML SOAJ  08/07/17   [provider]  albuterol (PROVENTIL HFA;VENTOLIN HFA) 108 (90 Base) MCG/ACT inhaler Inhale 2 puffs into the lungs every 4 (four)  hours as needed for wheezing or shortness of breath. 09/13/17   Kandra Nicolas, MD  AMBULATORY NON FORMULARY MEDICATION Medication Name: Probiotic; Yeast guard    [provider]  aspirin 81 MG chewable tablet Chew 81 mg by mouth. Chew 2 tablets by mouth two times a day    [provider]  cephALEXin (KEFLEX) 500 MG capsule Take 1 capsule (500 mg total) by mouth 4 (four) times daily. 11/19/17   Constant, Peggy, MD  escitalopram (LEXAPRO) 20 MG tablet Take 1 tablet (20 mg total) by mouth daily. 08/13/17   Hali Marry, MD  metroNIDAZOLE (FLAGYL) 500 MG tablet Take 1 tablet (500 mg total) by mouth 2 (two) times daily. 11/05/17   Hali Marry, MD  Multiple Vitamins-Minerals (THERA-M) TABS Take 1 tablet by mouth. 04/19/14   [provider]  norethindrone-ethinyl estradiol (MICROGESTIN,JUNEL,LOESTRIN) 1-20 MG-MCG tablet TAKE 1 TABLET BY MOUTH DAILY 12/23/17   Hali Marry, MD  pantoprazole (PROTONIX) 40 MG tablet Take by mouth. 11/26/16 11/26/17  [provider]    Family History Family History  Problem Relation Age of Onset  . Hypertension Father   . Heart disease  Father     Social History Social History   Tobacco Use  . Smoking status: Never Smoker  . Smokeless tobacco: Never Used  Substance Use Topics  . Alcohol use: Yes    Alcohol/week: 0.0 oz    Comment: occ  . Drug use: No     Allergies   Latex; Nsaids; Codeine; Hydrocodone-acetaminophen; Oxycodone; and Tape   Review of Systems Review of Systems  Constitutional: Negative for chills and fever.  HENT: Positive for congestion ( minimal) and sore throat. Negative for ear pain, trouble swallowing and voice change.   Respiratory: Negative for cough and shortness of breath.   Cardiovascular: Negative for chest pain and palpitations.  Gastrointestinal: Negative for abdominal pain, diarrhea, nausea and vomiting.  Musculoskeletal: Negative for arthralgias, back pain and  myalgias.  Skin: Negative for rash.  Neurological: Positive for headaches. Negative for dizziness and light-headedness.     Physical Exam Triage Vital Signs ED Triage Vitals  Enc Vitals Group     BP 01/21/18 1140 109/77     Pulse Rate 01/21/18 1140 80     Resp --      Temp 01/21/18 1140 98.4 F (36.9 C)     Temp Source 01/21/18 1140 Oral     SpO2 01/21/18 1140 99 %     Weight 01/21/18 1141 240 lb (108.9 kg)     Height 01/21/18 1141 5\' 8"  (1.727 m)     Head Circumference --      Peak Flow --      Pain Score 01/21/18 1140 4     Pain Loc --      Pain Edu? --      Excl. in [place]? --    No data found.  Updated Vital Signs BP 109/77 (BP Location: Right Arm)   Pulse 80   Temp 98.4 F (36.9 C) (Oral)   Ht 5\' 8"  (1.727 m)   Wt 240 lb (108.9 kg)   SpO2 99%   BMI 36.49 kg/m   Visual Acuity Right Eye Distance:   Left Eye Distance:   Bilateral Distance:    Right Eye Near:   Left Eye Near:    Bilateral Near:     Physical Exam  Constitutional: She is oriented to person, place, and time. She appears well-developed and well-nourished.  Non-toxic appearance. She does not appear ill. No distress.  HENT:  Head: Normocephalic and atraumatic.  Right Ear: Tympanic membrane normal.  Left Ear: Tympanic membrane normal.  Nose: Mucosal edema present. Right sinus exhibits no maxillary sinus tenderness and no frontal sinus tenderness. Left sinus exhibits no maxillary sinus tenderness and no frontal sinus tenderness.  Mouth/Throat: Uvula is midline and mucous membranes are normal. Posterior oropharyngeal erythema present. No oropharyngeal exudate, posterior oropharyngeal edema or tonsillar abscesses.  Eyes: EOM are normal.  Neck: Normal range of motion. Neck supple.  Cardiovascular: Normal rate and regular rhythm.  Pulmonary/Chest: Effort normal and breath sounds normal. No stridor. She has no wheezes. She has no rhonchi.  Musculoskeletal: Normal range of motion.  Lymphadenopathy:    She  has no cervical adenopathy.  Neurological: She is alert and oriented to person, place, and time.  Skin: Skin is warm and dry. No rash noted.  Psychiatric: She has a normal mood and affect. Her behavior is normal.  Nursing note and vitals reviewed.    UC Treatments / Results  Labs (all labs ordered are listed, but only abnormal results are displayed) Labs Reviewed  STREP A DNA PROBE  POCT RAPID STREP A (OFFICE)    EKG None  Radiology No results found.  Procedures Procedures (including critical care time)  Medications Ordered in UC Medications - No data to display  Initial Impression / Assessment and Plan / UC Course  I have reviewed the triage vital signs and the nursing notes.  Pertinent labs & imaging results that were available during my care of the patient were reviewed by me and considered in my medical decision making (see chart for details).     Hx and exam c/w viral illness at this time.  Encouraged symptomatic treatment while culture pending.  Final Clinical Impressions(s) / UC Diagnoses   Final diagnoses:  Sore throat  Viral pharyngitis  Exposure to strep throat     Discharge Instructions      You may take 500mg  acetaminophen every 4-6 hours or in combination with ibuprofen 400-600mg  every 6-8 hours as needed for pain, inflammation, and fever.  Be sure to drink at least eight 8oz glasses of water to stay well hydrated and get at least 8 hours of sleep at night, preferably more while sick.      ED Prescriptions    None     Controlled Substance Prescriptions  Controlled Substance Registry consulted? Not Applicable   Tyrell Antonio 01/21/18 1358

## 2018-01-22 ENCOUNTER — Telehealth: Payer: Self-pay | Admitting: *Deleted

## 2018-01-22 LAB — STREP A DNA PROBE: Group A Strep Probe: NOT DETECTED

## 2018-01-22 NOTE — Telephone Encounter (Signed)
Spoke to to given Tcx results. Charna Archer, LPN

## 2018-01-24 ENCOUNTER — Emergency Department (INDEPENDENT_AMBULATORY_CARE_PROVIDER_SITE_OTHER)
Admission: EM | Admit: 2018-01-24 | Discharge: 2018-01-24 | Disposition: A | Discharge status: Home / Self Care | Payer: BLUE CROSS/BLUE SHIELD | Source: Home / Self Care | Attending: Family Medicine | Admitting: Family Medicine

## 2018-01-24 DIAGNOSIS — J358 Other chronic diseases of tonsils and adenoids: Secondary | ICD-10-CM

## 2018-01-24 DIAGNOSIS — J029 Acute pharyngitis, unspecified: Secondary | ICD-10-CM | POA: Diagnosis not present

## 2018-01-24 MED ORDER — PREDNISONE 20 MG PO TABS: ORAL_TABLET | ORAL | 0 refills | Status: DC

## 2018-01-24 MED ORDER — METHYLPREDNISOLONE SODIUM SUCC 40 MG IJ SOLR
80.0000 mg | Freq: Once | INTRAMUSCULAR | Status: AC
  Administered 2018-01-24: 80 mg via INTRAMUSCULAR

## 2018-01-24 MED ORDER — CHLORHEXIDINE GLUCONATE 0.12% ORAL RINSE (MEDLINE KIT): OROMUCOSAL | 0 refills | Status: DC

## 2018-01-24 NOTE — Discharge Instructions (Signed)
°  Your strep culture from the last visit was negative, which indicates your sore throat is most likely from a viral illness, and/or due to tonsil stones that you have.  These stones are typically formed from calcium and can cause inflammation, pain and bad breath in your tonsils.  Other times, these stones do not cause any symptoms.    You may gargle warm salt water or use the mouthwash prescribed today to try to flush the stones out. If they continue to cause discomfort or keep coming back, please follow up with an [place] [facility] (ENT) or a dentist for further evaluation and treatment.  In rare case the stones can become caught in large pockets in your tonsils.  Your tonsils may need to be removed if symptoms become severe or persistent enough.

## 2018-01-24 NOTE — ED Provider Notes (Signed)
[name]    CSN: [id number] Arrival date & time: 01/24/18  1533     History   Chief Complaint Chief Complaint  Patient presents with  . Sore Throat    HPI Maria Payne is a 27 y.o. female.   HPI Maria Payne is a 28 y.o. female presenting to UC with c/o continued throat pain since 01/20/18.  Pt was seen on 01/21/18, rapid strep and strep culture were both negative.  She has been taking acetaminophen with minimal relief.  She has not noticed tonsil stones on her tonsils. Hx of same in the past.  She believes she needs an antibiotic. Denies fever, chills, n/v/d. Denies difficulty breathing or swallowing. She has not f/u with an ENT or dentist for the tonsil stones in the past.    Past Medical History:  Diagnosis Date  . 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 Had diagnostic cerebral arteriogram.     . GAD (generalized anxiety disorder) 04/04/2012  . Papilledema    Diamox  . Pseudotumor cerebri     Patient Active Problem List   Diagnosis Date Noted  . MDD (major depressive disorder) 04/04/2012  . GAD (generalized anxiety disorder) 04/04/2012  . 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 IIH (idiopathic intracranial hypertension) 09/10/2011  . DERMATITIS, PERIORAL 05/17/2010    Past Surgical History:  Procedure Laterality Date  . ANGIOPLASTY  2012   ? cerebral artery   . SLEEVE GASTROPLASTY  05/2014   Dr. Evorn Gong   . Stent placed  August 2014   Stenosis right transverse sigmoid junction,[facility]  . 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 Authorizing Provider  acetaZOLAMIDE (DIAMOX) 500 MG capsule Take by mouth. 12/17/16 03/14/18  [provider]  AIMOVIG 140 DOSE 70 MG/ML SOAJ  08/07/17   [provider]  albuterol (PROVENTIL HFA;VENTOLIN HFA) 108 (90 Base) MCG/ACT inhaler Inhale 2 puffs into the lungs every 4 (four) hours as needed for wheezing or shortness of breath. 09/13/17   Kandra Nicolas, MD  AMBULATORY NON FORMULARY MEDICATION Medication Name: Probiotic; Yeast guard    [provider]  aspirin 81 MG chewable tablet Chew 81 mg by mouth. Chew 2 tablets by mouth two times a day    [provider]  chlorhexidine gluconate, MEDLINE KIT, (PERIDEX) 0.12 % solution Brush and floss teeth, Rinse 21m for 30 seconds, gargle, spit, Do Not Swallow or rinse with water. Rinse twice daily. Wait 30 minutes before eating or drinking after using mouthwash. Use for 5 days. 01/24/18   PNoe Gens PA-C  escitalopram (LEXAPRO) 20 MG tablet Take 1 tablet (20 mg total) by mouth daily. 08/13/17   MHali Marry MD  metroNIDAZOLE (FLAGYL) 500 MG tablet Take 1 tablet (500 mg total) by mouth 2 (two) times daily. 11/05/17   MHali Marry MD  Multiple Vitamins-Minerals (THERA-M) TABS Take 1 tablet by mouth. 04/19/14  [provider]  norethindrone-ethinyl estradiol (MICROGESTIN,JUNEL,LOESTRIN) 1-20 MG-MCG tablet TAKE 1 TABLET BY MOUTH DAILY 12/23/17   Hali Marry, MD  pantoprazole (PROTONIX) 40 MG tablet Take by mouth. 11/26/16 11/26/17  [provider]  predniSONE (DELTASONE) 20 MG tablet 3 tabs po day one, then 2 po daily x 4 days 01/24/18   Noe Gens, PA-C    Family History Family History  Problem Relation Age of Onset  . Hypertension Father   . Heart disease Father     Social History Social History   Tobacco Use  . Smoking status: Never Smoker  . Smokeless tobacco: Never Used  Substance Use Topics  . Alcohol use: Yes    Alcohol/week: 0.0 oz    Comment: occ  . Drug use: No     Allergies   Latex; Nsaids; Codeine; Hydrocodone-acetaminophen; Oxycodone; and  Tape   Review of Systems Review of Systems  Constitutional: Negative for chills and fever.  HENT: Positive for congestion ( mild), postnasal drip and sore throat. Negative for ear pain, trouble swallowing and voice change.   Respiratory: Negative for cough and shortness of breath.   Cardiovascular: Negative for chest pain and palpitations.  Gastrointestinal: Negative for abdominal pain, diarrhea, nausea and vomiting.  Musculoskeletal: Negative for arthralgias, back pain, myalgias, neck pain and neck stiffness.  Skin: Negative for rash.  Neurological: Positive for headaches. Negative for dizziness and light-headedness.     Physical Exam Triage Vital Signs ED Triage Vitals  Enc Vitals Group     BP 01/24/18 1629 112/79     Pulse Rate 01/24/18 1629 92     Resp 01/24/18 1629 18     Temp 01/24/18 1629 98.6 F (37 C)     Temp Source 01/24/18 1629 Oral     SpO2 01/24/18 1629 98 %     Weight 01/24/18 1630 238 lb (108 kg)     Height 01/24/18 1630 5' 8"  (1.727 m)     Head Circumference --      Peak Flow --      Pain Score 01/24/18 1630 0     Pain Loc --      Pain Edu? --      Excl. in [place]? --    No data found.  Updated Vital Signs BP 112/79 (BP Location: Right Arm)   Pulse 92   Temp 98.6 F (37 C) (Oral)   Resp 18   Ht 5' 8"  (1.727 m)   Wt 238 lb (108 kg)   SpO2 98%   BMI 36.19 kg/m   Visual Acuity Right Eye Distance:   Left Eye Distance:   Bilateral Distance:    Right Eye Near:   Left Eye Near:    Bilateral Near:     Physical Exam  Constitutional: She is oriented to person, place, and time. She appears well-developed and well-nourished.  Non-toxic appearance. She does not appear ill. No distress.  HENT:  Head: Normocephalic and atraumatic.  Right Ear: Tympanic membrane normal.  Left Ear: Tympanic membrane normal.  Nose: Nose normal. Right sinus exhibits no maxillary sinus tenderness and no frontal sinus tenderness. Left sinus exhibits no maxillary sinus  tenderness and no frontal sinus tenderness.  Mouth/Throat: Uvula is midline and mucous membranes are normal. Posterior oropharyngeal erythema present. No tonsillar abscesses.    Small tonsolith on the Right w/o evidence of underlying abscess.   Eyes: EOM are normal.  Neck: Normal range of motion. Neck supple.  Cardiovascular: Normal rate and regular  rhythm.  Pulmonary/Chest: Effort normal and breath sounds normal. No stridor. No respiratory distress. She has no wheezes. She has no rhonchi.  Musculoskeletal: Normal range of motion.  Lymphadenopathy:    She has no cervical adenopathy.  Neurological: She is alert and oriented to person, place, and time.  Skin: Skin is warm and dry. Capillary refill takes less than 2 seconds. No rash noted. No erythema.  Psychiatric: She has a normal mood and affect. Her behavior is normal.  Nursing note and vitals reviewed.    UC Treatments / Results  Labs (all labs ordered are listed, but only abnormal results are displayed) Labs Reviewed - No data to display  EKG None  Radiology No results found.  Procedures Procedures (including critical care time)  Medications Ordered in UC Medications  methylPREDNISolone sodium succinate (SOLU-MEDROL) 40 mg/mL injection 80 mg (80 mg Intramuscular Given 01/24/18 1642)    Initial Impression / Assessment and Plan / UC Course  I have reviewed the triage vital signs and the nursing notes.  Pertinent labs & imaging results that were available during my care of the patient were reviewed by me and considered in my medical decision making (see chart for details).     Pt had negative strep culture 3 days ago. No evidence of tonsillar abscess on exam today. Tonsil stone noted, likely the cause of her pain as well as viral pharyngitis.  Solumedrol 11m IM given in UC Home medications also prescribed to help with throat discomfort. Home care instructions provided below.    Final Clinical Impressions(s) / UC  Diagnoses   Final diagnoses:  Viral pharyngitis  Tonsil stone     Discharge Instructions      Your strep culture from the last visit was negative, which indicates your sore throat is most likely from a viral illness, and/or due to tonsil stones that you have.  These stones are typically formed from calcium and can cause inflammation, pain and bad breath in your tonsils.  Other times, these stones do not cause any symptoms.    You may gargle warm salt water or use the mouthwash prescribed today to try to flush the stones out. If they continue to cause discomfort or keep coming back, please follow up with an [facility] ([facility]) or a dentist for further evaluation and treatment.  In rare case the stones can become caught in large pockets in your tonsils.  Your tonsils may need to be removed if symptoms become severe or persistent enough.     ED Prescriptions    Medication Sig Dispense Auth. Provider   chlorhexidine gluconate, MEDLINE KIT, (PERIDEX) 0.12 % solution Brush and floss teeth, Rinse 145mfor 30 seconds, gargle, spit, Do Not Swallow or rinse with water. Rinse twice daily. Wait 30 minutes before eating or drinking after using mouthwash. Use for 5 days. 150 mL Sondra Blixt O, PA-C   predniSONE (DELTASONE) 20 MG tablet 3 tabs po day one, then 2 po daily x 4 days 11 tablet PhNoe GensPA-C     Controlled Substance Prescriptions  Controlled Substance Registry consulted? Not Applicable   PhTyrell Antonio5/17/19 1707

## 2018-01-24 NOTE — ED Triage Notes (Signed)
Pt c/o sore throat since the 13th. Was seen on the 14th. Neg rapid strep and culture. Pt now is noticing "tonsil stones" on tonsils. Still c/o sore throat.

## 2018-02-07 ENCOUNTER — Ambulatory Visit: Payer: BLUE CROSS/BLUE SHIELD | Admitting: Licensed Clinical Social Worker

## 2018-02-18 ENCOUNTER — Ambulatory Visit (INDEPENDENT_AMBULATORY_CARE_PROVIDER_SITE_OTHER): Payer: BLUE CROSS/BLUE SHIELD | Admitting: Licensed Clinical Social Worker

## 2018-02-18 DIAGNOSIS — F3341 Major depressive disorder, recurrent, in partial remission: Secondary | ICD-10-CM | POA: Diagnosis not present

## 2018-02-24 ENCOUNTER — Encounter: Payer: Self-pay | Admitting: Family Medicine

## 2018-02-24 ENCOUNTER — Ambulatory Visit (INDEPENDENT_AMBULATORY_CARE_PROVIDER_SITE_OTHER): Payer: BLUE CROSS/BLUE SHIELD | Admitting: Family Medicine

## 2018-02-24 VITALS — BP 115/75 | HR 75 | Ht 68.0 in | Wt 246.0 lb

## 2018-02-24 DIAGNOSIS — Z3041 Encounter for surveillance of contraceptive pills: Secondary | ICD-10-CM | POA: Diagnosis not present

## 2018-02-24 DIAGNOSIS — F321 Major depressive disorder, single episode, moderate: Secondary | ICD-10-CM | POA: Diagnosis not present

## 2018-02-24 DIAGNOSIS — K2100 Gastro-esophageal reflux disease with esophagitis, without bleeding: Secondary | ICD-10-CM

## 2018-02-24 DIAGNOSIS — N898 Other specified noninflammatory disorders of vagina: Secondary | ICD-10-CM

## 2018-02-24 DIAGNOSIS — F411 Generalized anxiety disorder: Secondary | ICD-10-CM | POA: Diagnosis not present

## 2018-02-24 MED ORDER — ESCITALOPRAM OXALATE 20 MG PO TABS: 20.0000 mg | ORAL_TABLET | Freq: Every day | ORAL | 1 refills | Status: DC

## 2018-02-24 MED ORDER — PANTOPRAZOLE SODIUM 40 MG PO TBEC: 40.0000 mg | DELAYED_RELEASE_TABLET | ORAL | 1 refills | Status: DC

## 2018-02-24 NOTE — Progress Notes (Signed)
Subjective:    Patient ID: Maria Payne, female    DOB: March 12, 1991, 27 y.o.   MRN: [id number]  HPI F/U GAD/MDD  - Currently on Lexapro.  She is doing well overall.  PHQ 9 score of 6 denies any thoughts of being better off dead.  Gad 7 score of 8.  She rates her symptoms particularly for anxiety is somewhat difficult.  Her d epressive symptoms she rates as not difficult at all.  Refill her birth control for one year.  She is happy with her regimen and feels it does well to control her acne.   She has noticed some vaginal dryness though and thinks it could be related to when she ran out of protonix and switched to OTC Nexium.  Timing was similar. Started a few weeks ago.    She is actually scheduled for a tonsillectomy with Dr. Dimas Millin tomorrow for recurrent tonsillar swelling and tonsillar stones.  Review of Systems  BP 115/75   Pulse 75   Ht _0  (1.727 m)   Wt 246 lb (111.6 kg)   SpO2 99%   BMI 37.40 kg/m     Allergies  Allergen Reactions  . Latex     Other reaction(s): Other Diagnosed with blood test  . Nsaids     Other reaction(s): Other contraindication  . Codeine   . Hydrocodone-Acetaminophen Rash    Headaches Morphine, Dilaudid OK  . Oxycodone Rash    Headaches  . Tape Rash    welps    Past Medical History:  Diagnosis Date  . 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 Had diagnostic cerebral arteriogram.     . GAD (generalized anxiety disorder) 04/04/2012  . Papilledema    Diamox  . Pseudotumor cerebri     Past Surgical History:  Procedure Laterality Date  . ANGIOPLASTY  2012   ? cerebral artery   . SLEEVE GASTROPLASTY  05/2014   Dr. Evorn Gong   . Stent placed  August 2014   Stenosis right transverse sigmoid junction,[facility]  . 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 Marital status: Married    Spouse name: Not on file  . Number of children: 0  . Years of education: Not on file   . Highest education level: Not on file  Occupational History  . Occupation: unemployed.   . Occupation: CUSTOMER SERVICE    Employer: [employer]  Social Needs  . Financial resource strain: Not on file  . Food insecurity:    Worry: Not on file    Inability: Not on file  . Transportation needs:    Medical: Not on file    Non-medical: Not on file  Tobacco Use  . Smoking status: Never Smoker  . Smokeless tobacco: Never Used  Substance and Sexual Activity  . Alcohol use: Yes    Alcohol/week: 0.0 oz    Comment: occ  . Drug use: No  . Sexual activity: Yes    Partners: Male    Birth control/protection: Pill  Lifestyle  . Physical activity:    Days per week: Not on file    Minutes per session: Not on file  . Stress: Not on file  Relationships  . Social connections:    Talks on phone: Not on file    Gets together: Not on file    Attends religious service: Not on file    Active member of  club or organization: Not on file    Attends meetings of clubs or organizations: Not on file    Relationship status: Not on file  . Intimate partner violence:    Fear of current or ex partner: Not on file    Emotionally abused: Not on file    Physically abused: Not on file    Forced sexual activity: Not on file  Other Topics Concern  . Not on file  Social History Narrative   seperated from husband in nov 2014.  Still legally married.     Family History  Problem Relation Age of Onset  . Hypertension Father   . Heart disease Father     Outpatient Encounter Medications as of 02/24/2018  Medication Sig  . acetaZOLAMIDE (DIAMOX) 500 MG capsule Take by mouth.  [name] Kitchen AIMOVIG 140 DOSE 70 MG/ML SOAJ   . albuterol (PROVENTIL HFA;VENTOLIN HFA) 108 (90 Base) MCG/ACT inhaler Inhale 2 puffs into the lungs every 4 (four) hours as needed for wheezing or shortness of breath.  . AMBULATORY NON FORMULARY MEDICATION Medication Name: Probiotic; Yeast guard  . aspirin 81 MG chewable tablet Chew 81 mg by mouth.  Chew 2 tablets by mouth two times a day  . escitalopram (LEXAPRO) 20 MG tablet Take 1 tablet (20 mg total) by mouth daily.  [name] Kitchen esomeprazole (NEXIUM) 20 MG capsule Take 20 mg by mouth daily.  . Multiple Vitamins-Minerals (THERA-M) TABS Take 1 tablet by mouth.  . norethindrone-ethinyl estradiol (MICROGESTIN,JUNEL,LOESTRIN) 1-20 MG-MCG tablet TAKE 1 TABLET BY MOUTH DAILY  . [DISCONTINUED] escitalopram (LEXAPRO) 20 MG tablet Take 1 tablet (20 mg total) by mouth daily.  . pantoprazole (PROTONIX) 40 MG tablet Take 1 tablet (40 mg total) by mouth every morning.  . [DISCONTINUED] chlorhexidine gluconate, MEDLINE KIT, (PERIDEX) 0.12 % solution Brush and floss teeth, Rinse 51m for 30 seconds, gargle, spit, Do Not Swallow or rinse with water. Rinse twice daily. Wait 30 minutes before eating or drinking after using mouthwash. Use for 5 days.  . [DISCONTINUED] metroNIDAZOLE (FLAGYL) 500 MG tablet Take 1 tablet (500 mg total) by mouth 2 (two) times daily.  . [DISCONTINUED] pantoprazole (PROTONIX) 40 MG tablet Take by mouth.  . [DISCONTINUED] predniSONE (DELTASONE) 20 MG tablet 3 tabs po day one, then 2 po daily x 4 days   No facility-administered encounter medications on file as of 02/24/2018.          Objective:   Physical Exam  Constitutional: She is oriented to person, place, and time. She appears well-developed and well-nourished.  HENT:  Head: Normocephalic and atraumatic.  Cardiovascular: Normal rate, regular rhythm and normal heart sounds.  Pulmonary/Chest: Effort normal and breath sounds normal.  Neurological: She is alert and oriented to person, place, and time.  Skin: Skin is warm and dry.  Psychiatric: She has a normal mood and affect. Her behavior is normal.          Assessment & Plan:  GAD/MDD -doing well overall.  Still struggling a little bit with anxiety but doing well with her depressive symptoms.  She wants to continue with her current regimen of Lexapro 20 mg daily.  Refill  sent for the next 6 months.  Continue regular therapy with JElisha Headland  GERD -we will send over new prescription for Protonix so we can see if the Nexium may be causing some of the symptoms that she was concerned about.  Vaginal dryness - we will send over new prescription for Protonix so we can see if the  Nexium may be causing some of the symptoms that she was concerned about.  Otherwise consider switching her birth control pill if needed.  But she is been on this for quite some time and is otherwise happy with it.  Contraceptive counseling-birth control recently refilled.  Blood pressure at goal and she is happy with current regimen.

## 2018-03-04 ENCOUNTER — Ambulatory Visit: Payer: BLUE CROSS/BLUE SHIELD | Admitting: Licensed Clinical Social Worker

## 2018-03-19 ENCOUNTER — Encounter: Payer: Self-pay | Admitting: Physician Assistant

## 2018-03-19 ENCOUNTER — Ambulatory Visit (INDEPENDENT_AMBULATORY_CARE_PROVIDER_SITE_OTHER): Payer: BLUE CROSS/BLUE SHIELD | Admitting: Physician Assistant

## 2018-03-19 VITALS — BP 121/72 | HR 88 | Temp 98.3°F | Ht 67.99 in | Wt 241.0 lb

## 2018-03-19 DIAGNOSIS — R059 Cough, unspecified: Secondary | ICD-10-CM

## 2018-03-19 DIAGNOSIS — R05 Cough: Secondary | ICD-10-CM | POA: Diagnosis not present

## 2018-03-19 DIAGNOSIS — J069 Acute upper respiratory infection, unspecified: Secondary | ICD-10-CM | POA: Diagnosis not present

## 2018-03-19 DIAGNOSIS — Z9089 Acquired absence of other organs: Secondary | ICD-10-CM

## 2018-03-19 MED ORDER — IPRATROPIUM BROMIDE 0.06 % NA SOLN: 2.0000 | Freq: Four times a day (QID) | NASAL | 1 refills | Status: DC

## 2018-03-19 MED ORDER — AMOXICILLIN-POT CLAVULANATE 875-125 MG PO TABS: 1.0000 | ORAL_TABLET | Freq: Two times a day (BID) | ORAL | 0 refills | Status: DC

## 2018-03-19 NOTE — Patient Instructions (Signed)

## 2018-03-19 NOTE — Progress Notes (Signed)
  Subjective:     Patient ID: Maria Payne, female   DOB: 11/23/1990, 27 y.o.   MRN: [id number]  HPI Pt is a 26 yr female presenting to the clinic with 4 days history of SOB, myalgia, rhinorrhea, cough and sore throat. Pt admits to sick contacts. She has tried OTC Mucinex and Motrin with no relief. Pt also reports getting a tonsillectomy 1 month ago for tonsillolith but no complications from that besides residual cervical lymph node tenderness. Pt denies fever, wheezing, nausea vomiting, diarrhea, constipation, hemoptysis or any other symptoms. She was not given any antibiotics after her surgery.   Pt also mentioned waking up with her both her eyes shut which she found worrisome. She also reports associated ocular pruritus but denies eye drainage, eye pain, redness or visual changes at this time.   Review of Systems  Constitutional: Negative for chills, fatigue and fever.  HENT: Positive for congestion, postnasal drip, rhinorrhea and sore throat. Negative for ear discharge, ear pain, sinus pressure and sinus pain.   Eyes: Positive for itching. Negative for photophobia, pain, discharge, redness and visual disturbance.  Respiratory: Positive for cough and shortness of breath. Negative for choking and wheezing.   Cardiovascular: Negative for chest pain and palpitations.  Gastrointestinal: Negative.  Negative for diarrhea and nausea.  Genitourinary: Negative.   Musculoskeletal: Positive for myalgias. Negative for arthralgias and neck pain.  Allergic/Immunologic: Positive for environmental allergies.  All other systems reviewed and are negative.   Objective:   Physical Exam  Constitutional: She appears well-developed and well-nourished. No distress.  HENT:  Head: Normocephalic and atraumatic.  Right Ear: External ear normal.  Left Ear: External ear normal.  Nose: Nose normal.  Mouth/Throat: Posterior oropharyngeal erythema (mild erythema without exudate ) present. No oropharyngeal exudate.   Eyes: Pupils are equal, round, and reactive to light. Conjunctivae and EOM are normal. Right eye exhibits no discharge. Left eye exhibits no discharge.  Neck: Normal range of motion. Neck supple.  Cardiovascular: Normal rate, regular rhythm and normal heart sounds. Exam reveals no friction rub.  No murmur heard. Pulmonary/Chest: Effort normal and breath sounds normal. No stridor. No respiratory distress. She has no wheezes. She has no rales.  Musculoskeletal: Normal range of motion.  Lymphadenopathy:    She has cervical adenopathy (tenderness upon palpation).  Skin: Skin is warm and dry.   Assessment:  1. Status post tonsillectomy 2 Upper Respiratory Infectoin    Plan:  Maria Payne KitchenMarland KitchenMyya was seen today for bronchitis.  Diagnoses and all orders for this visit:  Status post tonsillectomy -     amoxicillin-clavulanate (AUGMENTIN) 875-125 MG tablet; Take 1 tablet by mouth 2 (two) times daily. For 10 days.  Acute upper respiratory infection -     amoxicillin-clavulanate (AUGMENTIN) 875-125 MG tablet; Take 1 tablet by mouth 2 (two) times daily. For 10 days. -     ipratropium (ATROVENT) 0.06 % nasal spray; Place 2 sprays into both nostrils 4 (four) times daily.  Cough -     ipratropium (ATROVENT) 0.06 % nasal spray; Place 2 sprays into both nostrils 4 (four) times daily.   -Pt will be started on Augmentin due to post tonsillectomy and new URI symptoms present for 4 days and worsening despite OTC medication usage. She also leaving for vacation this weekend.  -encouraged use of atrovent as needed.  -Patient was told to follow up If symptoms worsen or has no relief. HO given.  -Plan was discussed with patient and patient voiced understanding.

## 2018-03-31 ENCOUNTER — Encounter: Payer: Self-pay | Admitting: *Deleted

## 2018-03-31 ENCOUNTER — Other Ambulatory Visit (INDEPENDENT_AMBULATORY_CARE_PROVIDER_SITE_OTHER): Payer: BLUE CROSS/BLUE SHIELD | Admitting: *Deleted

## 2018-03-31 VITALS — Temp 98.9°F | Resp 16 | Ht 69.0 in | Wt 241.0 lb

## 2018-03-31 DIAGNOSIS — R309 Painful micturition, unspecified: Secondary | ICD-10-CM | POA: Diagnosis not present

## 2018-03-31 LAB — POCT URINALYSIS DIPSTICK
Bilirubin, UA: NEGATIVE
Glucose, UA: NEGATIVE
Ketones, UA: NEGATIVE
Nitrite, UA: NEGATIVE
Protein, UA: NEGATIVE
Spec Grav, UA: 1.02 (ref 1.010–1.025)
Urobilinogen, UA: NEGATIVE E.U./dL — AB
pH, UA: 7 (ref 5.0–8.0)

## 2018-03-31 MED ORDER — PHENAZOPYRIDINE HCL 200 MG PO TABS: 200.0000 mg | ORAL_TABLET | Freq: Three times a day (TID) | ORAL | 0 refills | Status: DC | PRN

## 2018-03-31 MED ORDER — FLUCONAZOLE 150 MG PO TABS: 150.0000 mg | ORAL_TABLET | Freq: Once | ORAL | 3 refills | Status: AC

## 2018-03-31 MED ORDER — SULFAMETHOXAZOLE-TRIMETHOPRIM 800-160 MG PO TABS: 1.0000 | ORAL_TABLET | Freq: Two times a day (BID) | ORAL | 0 refills | Status: DC

## 2018-03-31 NOTE — Progress Notes (Signed)
Pt here for a nurse visit with c/o's urinary pain and burning since yesterday.  She noticed some bleeding.  Her POC urine does show large blood and leukocytes.  Since she is symptommatic will send in RX for ATB to [facility].  Dr Hulan Fray aware and ordered the meds to be sent.  Urine culture sent and will contact pat.

## 2018-04-02 LAB — URINE CULTURE
MICRO NUMBER:: 90865858
SPECIMEN QUALITY:: ADEQUATE

## 2018-04-03 ENCOUNTER — Ambulatory Visit: Payer: BLUE CROSS/BLUE SHIELD | Admitting: Licensed Clinical Social Worker

## 2018-04-03 ENCOUNTER — Telehealth: Payer: Self-pay | Admitting: *Deleted

## 2018-04-03 MED ORDER — CIPROFLOXACIN HCL 500 MG PO TABS: ORAL_TABLET | ORAL | 0 refills | Status: DC

## 2018-04-03 NOTE — Telephone Encounter (Signed)
Pt notified that her sensitivities were resistant to Bactrim and ATB was changed to Cipro per Dr Hulan Fray.  RX sent to [facility]

## 2018-04-03 NOTE — Telephone Encounter (Signed)
-----   Message from Emily Filbert, MD sent at 04/02/2018  4:26 PM EDT ----- I think I sent you a message today to treat her with bactrim, but I JUST got the sensitivities and it is resistent to bactrim. Let's go with cipro 500 mg BID for 7 days. Thanks

## 2018-04-07 ENCOUNTER — Ambulatory Visit: Payer: Self-pay | Admitting: Licensed Clinical Social Worker

## 2018-04-10 ENCOUNTER — Other Ambulatory Visit: Payer: Self-pay

## 2018-04-10 ENCOUNTER — Emergency Department
Admission: EM | Admit: 2018-04-10 | Discharge: 2018-04-10 | Disposition: A | Discharge status: Home / Self Care | Payer: BLUE CROSS/BLUE SHIELD | Source: Home / Self Care | Attending: Family Medicine | Admitting: Family Medicine

## 2018-04-10 DIAGNOSIS — R21 Rash and other nonspecific skin eruption: Secondary | ICD-10-CM | POA: Diagnosis not present

## 2018-04-10 MED ORDER — TRIAMCINOLONE ACETONIDE 0.1 % EX CREA: 1.0000 "application " | TOPICAL_CREAM | Freq: Two times a day (BID) | CUTANEOUS | 0 refills | Status: DC

## 2018-04-10 MED ORDER — DEXAMETHASONE SODIUM PHOSPHATE 10 MG/ML IJ SOLN
10.0000 mg | Freq: Once | INTRAMUSCULAR | Status: AC
  Administered 2018-04-10: 10 mg via INTRAMUSCULAR

## 2018-04-10 NOTE — ED Provider Notes (Signed)
[name]    CSN: [id number] Arrival date & time: 04/10/18  2993     History   Chief Complaint Chief Complaint  Patient presents with  . Rash    HPI Maria Payne is a 27 y.o. female.   HPI  Maria Payne is a 27 y.o. female presenting to UC with c/o 3 days of itchy red rash that developed on her neck and progressed to chin and upper chest. She completed a course of Cipro for a UTI about 1 week ago but had 2 days of Bactrim prior to that (medication was changed based on urine culture results).  No prior known allergy to either antibiotic.  Denies oral swelling or other rashes. No new soaps, lotions, or cosmetics.  She tried oral benadryl with minimal relief.    Past Medical History:  Diagnosis Date  . 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 Had diagnostic cerebral arteriogram.     . GAD (generalized anxiety disorder) 04/04/2012  . Papilledema    Diamox  . Pseudotumor cerebri     Patient Active Problem List   Diagnosis Date Noted  . MDD (major depressive disorder) 04/04/2012  . GAD (generalized anxiety disorder) 04/04/2012  . 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 IIH (idiopathic intracranial hypertension) 09/10/2011  . DERMATITIS, PERIORAL 05/17/2010    Past Surgical History:  Procedure Laterality Date  . ANGIOPLASTY  2012   ? cerebral artery   . SLEEVE GASTROPLASTY  05/2014   Dr. Evorn Gong   . Stent placed  August 2014   Stenosis right transverse sigmoid junction,[facility]  . 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 Authorizing Provider  AIMOVIG 140 DOSE 70 MG/ML SOAJ  08/07/17   [provider]  albuterol (PROVENTIL HFA;VENTOLIN  HFA) 108 (90 Base) MCG/ACT inhaler Inhale 2 puffs into the lungs every 4 (four) hours as needed for wheezing or shortness of breath. 09/13/17   Kandra Nicolas, MD  AMBULATORY NON FORMULARY MEDICATION Medication Name: Probiotic; Yeast guard    [provider]  aspirin 81 MG chewable tablet Chew 81 mg by mouth. Chew 2 tablets by mouth two times a day    [provider]  ciprofloxacin (CIPRO) 500 MG tablet Take 1 tablet PO BID for 7 days 04/03/18   Emily Filbert, MD  escitalopram (LEXAPRO) 20 MG tablet Take 1 tablet (20 mg total) by mouth daily. 02/24/18   Hali Marry, MD  esomeprazole (NEXIUM) 20 MG capsule Take 20 mg by mouth daily.    [provider]  ipratropium (ATROVENT) 0.06 % nasal spray Place 2 sprays into both nostrils 4 (four) times daily. 03/19/18   Breeback, Jade L, PA-C  Multiple Vitamins-Minerals (THERA-M) TABS Take 1 tablet by mouth. 04/19/14   [provider]  norethindrone-ethinyl estradiol (MICROGESTIN,JUNEL,LOESTRIN) 1-20 MG-MCG tablet TAKE 1 TABLET BY MOUTH DAILY 12/23/17   Hali Marry, MD  pantoprazole (  PROTONIX) 40 MG tablet Take 1 tablet (40 mg total) by mouth every morning. 02/24/18 02/24/19  Hali Marry, MD  phenazopyridine (PYRIDIUM) 200 MG tablet Take 1 tablet (200 mg total) by mouth 3 (three) times daily as needed for pain. 03/31/18   Emily Filbert, MD  triamcinolone cream (KENALOG) 0.1 % Apply 1 application topically 2 (two) times daily. 04/10/18   Noe Gens, PA-C    Family History Family History  Problem Relation Age of Onset  . Hypertension Father   . Heart disease Father     Social History Social History   Tobacco Use  . Smoking status: Never Smoker  . Smokeless tobacco: Never Used  Substance Use Topics  . Alcohol use: Yes    Alcohol/week: 0.0 oz    Comment: occ  . Drug use: No     Allergies   Latex; Nsaids; Codeine; Hydrocodone-acetaminophen; Oxycodone; and Tape   Review of Systems Review  of Systems  Constitutional: Negative for chills and fever.  HENT: Negative for facial swelling.   Respiratory: Negative for shortness of breath and stridor.   Gastrointestinal: Negative for nausea and vomiting.  Musculoskeletal: Negative for arthralgias, joint swelling and myalgias.  Skin: Positive for rash. Negative for wound.     Physical Exam Triage Vital Signs ED Triage Vitals  Enc Vitals Group     BP 04/10/18 0948 119/82     Pulse Rate 04/10/18 0948 80     Resp --      Temp 04/10/18 0948 97.9 F (36.6 C)     Temp Source 04/10/18 0948 Oral     SpO2 04/10/18 0948 98 %     Weight 04/10/18 0949 246 lb (111.6 kg)     Height 04/10/18 0949 5\' 6"  (1.676 m)     Head Circumference --      Peak Flow --      Pain Score 04/10/18 0948 0     Pain Loc --      Pain Edu? --      Excl. in [place]? --    No data found.  Updated Vital Signs BP 119/82 (BP Location: Right Arm)   Pulse 80   Temp 97.9 F (36.6 C) (Oral)   Ht 5\' 6"  (1.676 m)   Wt 246 lb (111.6 kg)   LMP 09/10/2017 Comment: uses BC  SpO2 98%   BMI 39.71 kg/m   Visual Acuity Right Eye Distance:   Left Eye Distance:   Bilateral Distance:    Right Eye Near:   Left Eye Near:    Bilateral Near:     Physical Exam  Constitutional: She is oriented to person, place, and time. She appears well-developed and well-nourished. No distress.  HENT:  Head: Normocephalic and atraumatic.  Mouth/Throat: Oropharynx is clear and moist.  No oral swelling  Eyes: EOM are normal.  Neck: Normal range of motion.  Cardiovascular: Normal rate.  Pulmonary/Chest: Effort normal. No stridor. No respiratory distress.  Musculoskeletal: Normal range of motion.  Neurological: She is alert and oriented to person, place, and time.  Skin: Skin is warm and dry. Rash noted. She is not diaphoretic. There is erythema.     Neck and upper chest: diffuse erythematous scattered papular rash. Rash does blanch. Non-tender.   Psychiatric: She has a normal  mood and affect. Her behavior is normal.  Nursing note and vitals reviewed.    UC Treatments / Results  Labs (all labs ordered are listed, but only abnormal results are displayed)  Labs Reviewed - No data to display  EKG None  Radiology No results found.  Procedures Procedures (including critical care time)  Medications Ordered in UC Medications  dexamethasone (DECADRON) injection 10 mg (10 mg Intramuscular Given 04/10/18 1000)    Initial Impression / Assessment and Plan / UC Course  I have reviewed the triage vital signs and the nursing notes.  Pertinent labs & imaging results that were available during my care of the patient were reviewed by me and considered in my medical decision making (see chart for details).     Rash most c/w allergic reaction w/o evidence of anaphylaxis Decadron given in UC as pt requested "a shot"   Final Clinical Impressions(s) / UC Diagnoses   Final diagnoses:  Rash and nonspecific skin eruption     Discharge Instructions      Your rash could be due to the recent use of antibiotics Bactrim (trimethoprim-sulfamethoxazole) or cirpofloxacin for your recent urinary infection, or it could be due to something unrelated.  You may take an over the counter antihistamine such as Claritin, Zyrtec, Allegra, or Benadryl to help with itching.  You may also use the triamcinolone cream prescribed.  Please follow up with family medicine in 1 week if not improving, sooner if worsening.     ED Prescriptions    Medication Sig Dispense Auth. Provider   triamcinolone cream (KENALOG) 0.1 % Apply 1 application topically 2 (two) times daily. 30 g Noe Gens, PA-C     Controlled Substance Prescriptions [place] Controlled Substance Registry consulted? Not Applicable   Tyrell Antonio 04/10/18 1204

## 2018-04-10 NOTE — Discharge Instructions (Signed)
°  Your rash could be due to the recent use of antibiotics Bactrim (trimethoprim-sulfamethoxazole) or cirpofloxacin for your recent urinary infection, or it could be due to something unrelated.  You may take an over the counter antihistamine such as Claritin, Zyrtec, Allegra, or Benadryl to help with itching.  You may also use the triamcinolone cream prescribed.  Please follow up with family medicine in 1 week if not improving, sooner if worsening.

## 2018-04-10 NOTE — ED Triage Notes (Signed)
Pt has had a rash x 3 days, started on neck and has progressed to chin and chest.

## 2018-04-16 ENCOUNTER — Ambulatory Visit (INDEPENDENT_AMBULATORY_CARE_PROVIDER_SITE_OTHER): Payer: BLUE CROSS/BLUE SHIELD | Admitting: Licensed Clinical Social Worker

## 2018-04-16 DIAGNOSIS — F3341 Major depressive disorder, recurrent, in partial remission: Secondary | ICD-10-CM

## 2018-05-22 ENCOUNTER — Ambulatory Visit: Payer: BLUE CROSS/BLUE SHIELD | Admitting: Licensed Clinical Social Worker

## 2018-05-26 ENCOUNTER — Ambulatory Visit (INDEPENDENT_AMBULATORY_CARE_PROVIDER_SITE_OTHER): Payer: BLUE CROSS/BLUE SHIELD | Admitting: Licensed Clinical Social Worker

## 2018-05-26 DIAGNOSIS — F3341 Major depressive disorder, recurrent, in partial remission: Secondary | ICD-10-CM | POA: Diagnosis not present

## 2018-06-10 ENCOUNTER — Ambulatory Visit: Payer: BLUE CROSS/BLUE SHIELD | Admitting: Certified Nurse Midwife

## 2018-06-10 ENCOUNTER — Encounter: Payer: Self-pay | Admitting: Certified Nurse Midwife

## 2018-06-10 VITALS — BP 124/82 | HR 79 | Ht 68.0 in | Wt 248.0 lb

## 2018-06-10 DIAGNOSIS — R102 Pelvic and perineal pain: Secondary | ICD-10-CM

## 2018-06-10 DIAGNOSIS — N939 Abnormal uterine and vaginal bleeding, unspecified: Secondary | ICD-10-CM | POA: Diagnosis not present

## 2018-06-10 DIAGNOSIS — M545 Low back pain, unspecified: Secondary | ICD-10-CM

## 2018-06-10 LAB — POCT URINALYSIS DIPSTICK
Bilirubin, UA: NEGATIVE
Blood, UA: NEGATIVE
Glucose, UA: NEGATIVE
Ketones, UA: NEGATIVE
Leukocytes, UA: NEGATIVE
Nitrite, UA: NEGATIVE
Protein, UA: NEGATIVE
Spec Grav, UA: 1.01 (ref 1.010–1.025)
Urobilinogen, UA: 0.2 E.U./dL
pH, UA: 7.5 (ref 5.0–8.0)

## 2018-06-10 NOTE — Patient Instructions (Signed)
Endometriosis Endometriosis is a condition in which the tissue that lines the uterus (endometrium) grows outside of its normal location. The tissue may grow in many locations close to the uterus, but it commonly grows on the ovaries, fallopian tubes, vagina, or bowel. When the uterus sheds the endometrium every menstrual cycle, there is bleeding wherever the endometrial tissue is located. This can cause pain because blood is irritating to tissues that are not normally exposed to it. What are the causes? The cause of endometriosis is not known. What increases the risk? You may be more likely to develop endometriosis if you:  Have a family history of endometriosis.  Have never given birth.  Started your period at age 10 or younger.  Have high levels of estrogen in your body.  Were exposed to a certain medicine (diethylstilbestrol) before you were born (in utero).  Had low birth weight.  Were born as a twin, triplet, or other multiple.  Have a BMI of less than 25. BMI is an estimate of body fat and is calculated from height and weight.  What are the signs or symptoms? Often, there are no symptoms of this condition. If you do have symptoms, they may:  Vary depending on where your endometrial tissue is growing.  Occur during your menstrual period (most common) or midcycle.  Come and go, or you may go months with no symptoms at all.  Stop with menopause.  Symptoms may include:  Pain in the back or abdomen.  Heavier bleeding during periods.  Pain during sex.  Painful bowel movements.  Infertility.  Pelvic pain.  Bleeding more than once a month.  How is this diagnosed? This condition is diagnosed based on your symptoms and a physical exam. You may have tests, such as:  Blood tests and urine tests. These may be done to help rule out other possible causes of your symptoms.  Ultrasound, to look for abnormal tissues.  An X-ray of the lower bowel (barium enema).  An  ultrasound that is done through the vagina (transvaginally).  CT scan.  MRI.  Laparoscopy. In this procedure, a lighted, pencil-sized instrument called a laparoscope is inserted into your abdomen through an incision. The laparoscope allows your health care provider to look at the organs inside your body and check for abnormal tissue to confirm the diagnosis. If abnormal tissue is found, your health care provider may remove a small piece of tissue (biopsy) to be examined under a microscope.  How is this treated? Treatment for this condition may include:  Medicines to relieve pain, such as NSAIDs.  Hormone therapy. This involves using artificial (synthetic) hormones to reduce endometrial tissue growth. Your health care provider may recommend using a hormonal form of birth control, or other medicines.  Surgery. This may be done to remove abnormal endometrial tissue. ? In some cases, tissue may be removed using a laparoscope and a laser (laparoscopic laser treatment). ? In severe cases, surgery may be done to remove the fallopian tubes, uterus, and ovaries (hysterectomy).  Follow these instructions at home:  Take over-the-counter and prescription medicines only as told by your health care provider.  Do not drive or use heavy machinery while taking prescription pain medicine.  Try to avoid activities that cause pain, including sexual activity.  Keep all follow-up visits as told by your health care provider. This is important. Contact a health care provider if:  You have pain in the area between your hip bones (pelvic area) that occurs: ? Before, during, or   after your period. ? In between your period and gets worse during your period. ? During or after sex. ? With bowel movements or urination, especially during your period.  You have problems getting pregnant.  You have a fever. Get help right away if:  You have severe pain that does not get better with medicine.  You have severe  nausea and vomiting, or you cannot eat without vomiting.  You have pain that affects only the lower, right side of your abdomen.  You have abdominal pain that gets worse.  You have abdominal swelling.  You have blood in your stool. This information is not intended to replace advice given to you by your health care provider. Make sure you discuss any questions you have with your health care provider. Document Released: 08/24/2000 Document Revised: 06/01/2016 Document Reviewed: 01/28/2016 Elsevier Interactive Patient Education  2018 Elsevier Inc.  

## 2018-06-10 NOTE — Progress Notes (Signed)
PT states she has had constant pelvic pain and lower back pain since the beginning of September and is worse after intercourse

## 2018-06-10 NOTE — Progress Notes (Signed)
GYNECOLOGY PROBLEM VISIT ENCOUNTER NOTE  Subjective:   Maria Payne is a 27 y.o. G0P0000 female here for a problem visit.  Current complaints: pelvic pain and back pain. She reports symptoms started occurring at the beginning of September. Reports pain is constant lower abdominal cramping and back aching. Symptoms are associated with pain during intercourse and abdominal uterine bleeding- is on Junel for birth control since January and has not had cycle until this month, which she describes as heavy with large clots present. Denies vaginal discharge or changes in partners.   Gynecologic History Patient's last menstrual period was 05/11/2018. Contraception: OCP (estrogen/progesterone)  Obstetric History OB History  Gravida Para Term Preterm AB Living  0 0 0 0 0 0  SAB TAB Ectopic Multiple Live Births  0 0 0 0 0    Past Medical History:  Diagnosis Date  . 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 Had diagnostic cerebral arteriogram.     . GAD (generalized anxiety disorder) 04/04/2012  . Papilledema    Diamox  . Pseudotumor cerebri     Past Surgical History:  Procedure Laterality Date  . ANGIOPLASTY  2012   ? cerebral artery   . SLEEVE GASTROPLASTY  05/2014   Dr. Evorn Gong   . Stent placed  August 2014   Stenosis right transverse sigmoid junction,[facility]  . 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 AIMOVIG 140 DOSE 70 MG/ML SOAJ   4  . albuterol (PROVENTIL HFA;VENTOLIN HFA) 108 (90 Base) MCG/ACT inhaler Inhale 2 puffs into the lungs every 4 (four) hours as needed for wheezing or shortness of breath. 1 Inhaler 1  . AMBULATORY NON FORMULARY MEDICATION Medication Name: Probiotic; Yeast guard    . aspirin 81 MG chewable tablet Chew 81 mg by mouth. Chew 2 tablets by mouth two times a day    . escitalopram (LEXAPRO) 20 MG tablet Take 1 tablet (20 mg total) by mouth  daily. 90 tablet 1  . esomeprazole (NEXIUM) 20 MG capsule Take 20 mg by mouth daily.    [name] Kitchen ipratropium (ATROVENT) 0.06 % nasal spray Place 2 sprays into both nostrils 4 (four) times daily. 15 mL 1  . Multiple Vitamins-Minerals (THERA-M) TABS Take 1 tablet by mouth.    . norethindrone-ethinyl estradiol (MICROGESTIN,JUNEL,LOESTRIN) 1-20 MG-MCG tablet TAKE 1 TABLET BY MOUTH DAILY 3 Package 3  . pantoprazole (PROTONIX) 40 MG tablet Take 1 tablet (40 mg total) by mouth every morning. 90 tablet 1  . phenazopyridine (PYRIDIUM) 200 MG tablet Take 1 tablet (200 mg total) by mouth 3 (three) times daily as needed for pain. 10 tablet 0  . triamcinolone cream (KENALOG) 0.1 % Apply 1 application topically 2 (two) times daily. 30 g 0   No current facility-administered medications on file prior to visit.     Allergies  Allergen Reactions  . Latex     Other reaction(s): Other Diagnosed with blood test  . Nsaids     Other reaction(s): Other contraindication  . Codeine   . Hydrocodone-Acetaminophen Rash    Headaches Morphine, Dilaudid OK  . Oxycodone Rash    Headaches  . Tape Rash    welps    Social History:  reports that she has never smoked. She has never used smokeless tobacco. She reports that she drinks alcohol. She reports that she does not use drugs.  Family History  Problem Relation Age of Onset  . Hypertension Father   . Heart disease Father     The following portions of the patient's history were reviewed and updated as appropriate: allergies, current medications, past family history, past medical history, past social history, past surgical history and problem list.  Review of Systems Pertinent items noted in HPI and remainder of comprehensive ROS otherwise negative.   Objective:  BP 124/82   Pulse 79   Ht 5\' 8"  (1.727 m)   Wt 248 lb (112.5 kg)   LMP 05/11/2018   BMI 37.71 kg/m  CONSTITUTIONAL: Well-developed, well-nourished female in no acute distress.  HENT:   Normocephalic, atraumatic, External right and left ear normal. Oropharynx is clear and moist EYES: Conjunctivae and EOM are normal. Pupils are equal, round, and reactive to light. NECK: Normal range of motion, supple, no masses.  Normal thyroid.  SKIN: Skin is warm and dry. No rash noted. Not diaphoretic. No erythema. No pallor. NEUROLOGIC: Alert and oriented to person, place, and time. Normal reflexes, muscle tone coordination. No cranial nerve deficit noted. PSYCHIATRIC: Normal mood and affect. Normal behavior. Normal judgment and thought content. CARDIOVASCULAR: Normal heart rate noted, regular rhythm RESPIRATORY: Clear to auscultation bilaterally. Effort and breath sounds normal, no problems with respiration noted. ABDOMEN: Soft, normal bowel sounds, no distention noted.  No rebound or guarding. Lower abdominal tenderness. PELVIC: Normal appearing external genitalia.  No abnormal discharge noted. Normal uterine size, no other palpable masses, no adnexal tenderness. Uterine tenderness.   Assessment and Plan:  1. Pelvic pain - Patient reports sister was recently diagnosed with endometriosis and had same symptoms. - Endometriosis vs. Ovarian Cyst   - [place] ordered to rule out other abnormalities of uterus or adnexa  - Urine Culture - US PELVIC COMPLETE WITH TRANSVAGINAL; scheduled for tomorrow   2. Abnormal uterine bleeding (AUB) - Continue Junel as prescribed   3. Acute bilateral low back pain, unspecified whether sciatica present - Rule out UTI with urine culture  - POCT Urinalysis Dipstick  Will follow up results of [place] and manage accordingly. Please refer to After Visit Summary for other counseling recommendations.  Follow up in 4 weeks for initiation of medication for possible endometriosis    Lajean Manes, CNM 06/10/18, 5:14 PM

## 2018-06-11 ENCOUNTER — Ambulatory Visit (INDEPENDENT_AMBULATORY_CARE_PROVIDER_SITE_OTHER): Payer: BLUE CROSS/BLUE SHIELD

## 2018-06-11 DIAGNOSIS — R102 Pelvic and perineal pain: Secondary | ICD-10-CM

## 2018-06-19 ENCOUNTER — Ambulatory Visit: Payer: Self-pay | Admitting: Licensed Clinical Social Worker

## 2018-06-30 ENCOUNTER — Ambulatory Visit (INDEPENDENT_AMBULATORY_CARE_PROVIDER_SITE_OTHER): Payer: BLUE CROSS/BLUE SHIELD | Admitting: Obstetrics & Gynecology

## 2018-06-30 ENCOUNTER — Encounter: Payer: Self-pay | Admitting: Obstetrics & Gynecology

## 2018-06-30 VITALS — BP 121/80 | HR 100 | Resp 16 | Ht 68.0 in | Wt 246.0 lb

## 2018-06-30 DIAGNOSIS — N809 Endometriosis, unspecified: Secondary | ICD-10-CM | POA: Diagnosis not present

## 2018-06-30 DIAGNOSIS — G8929 Other chronic pain: Secondary | ICD-10-CM | POA: Diagnosis not present

## 2018-06-30 DIAGNOSIS — R102 Pelvic and perineal pain: Secondary | ICD-10-CM | POA: Diagnosis not present

## 2018-06-30 DIAGNOSIS — M5440 Lumbago with sciatica, unspecified side: Secondary | ICD-10-CM | POA: Diagnosis not present

## 2018-06-30 MED ORDER — ELAGOLIX SODIUM 150 MG PO TABS: 1.0000 | ORAL_TABLET | Freq: Every day | ORAL | 2 refills | Status: DC

## 2018-06-30 NOTE — Progress Notes (Addendum)
   Subjective:    Patient ID: Maria Payne, female    DOB: 1991-04-24, 27 y.o.   MRN: [id number]  HPI  27 yo female c/o severe pelvic pain with intercourse and urination.  Pain is intense.  Pt on continuous OCPs and does not have a cycle (did bleed in September).  Patient has had frequent urinary tract infections in the past.  No recent blood.  No problems with bowel movements.  Pain occurs with urination and starts in the back and cramps although around the front.  Patient also has pain with intercourse as previously stated.  Patient has had back surgery.  Review of Systems  Constitutional: Negative.   Respiratory: Negative.   Cardiovascular: Negative.   Gastrointestinal: Negative.   Genitourinary: Positive for dyspareunia, dysuria and menstrual problem. Negative for hematuria and vaginal bleeding.  Musculoskeletal: Positive for back pain.  Psychiatric/Behavioral: Negative.        Objective:   Physical Exam  Constitutional: She is oriented to person, place, and time. She appears well-developed and well-nourished. No distress.  HENT:  Head: Normocephalic and atraumatic.  Eyes: Conjunctivae are normal.  Cardiovascular: Normal rate.  Pulmonary/Chest: Effort normal.  Musculoskeletal: She exhibits no edema.  Neurological: She is alert and oriented to person, place, and time.  Skin: Skin is warm and dry.  Psychiatric: She has a normal mood and affect.  Vitals reviewed.  Vitals:   06/30/18 1527  BP: 121/80  Pulse: 100  Resp: 16  Weight: 246 lb (111.6 kg)  Height: 5\' 8"  (1.727 m)      Assessment & Plan:  27 year old female with dysmenorrhea, dysuria, back pain, dyspareunia.  Continuous OCPs have not helped. Ultrasound shows moderate free fluid so recent pain could be from a ruptured ovarian cyst. Discussed treating for endometriosis with [facility].  Pt given coupon card and counseled on side effects of medication. Discontinue OCPs; pt to use condoms.  If pain improved can  think about Mirena IUD. Check LFTs today. RTC 2 months Pt has had failed lumbar shunt (pseudotumor cerebri).  Will order MRI to evaluate for nerve/disc issues.  25 minutes spent face-to-face with patient with greater than 50% counseling.  AddendumLorella Nimrod approved the MRI  Number [phone]

## 2018-06-30 NOTE — Addendum Note (Signed)
Addended by: Guss Bunde on: 06/30/2018 04:35 PM   Modules accepted: Orders

## 2018-07-01 ENCOUNTER — Telehealth: Payer: Self-pay

## 2018-07-01 LAB — COMPREHENSIVE METABOLIC PANEL
AG Ratio: 1.7 (calc) (ref 1.0–2.5)
ALT: 9 U/L (ref 6–29)
AST: 13 U/L (ref 10–30)
Albumin: 4.3 g/dL (ref 3.6–5.1)
Alkaline phosphatase (APISO): 42 U/L (ref 33–115)
BUN: 17 mg/dL (ref 7–25)
CO2: 20 mmol/L (ref 20–32)
Calcium: 8.8 mg/dL (ref 8.6–10.2)
Chloride: 109 mmol/L (ref 98–110)
Creat: 0.85 mg/dL (ref 0.50–1.10)
Globulin: 2.5 g/dL (calc) (ref 1.9–3.7)
Glucose, Bld: 76 mg/dL (ref 65–99)
Potassium: 3.8 mmol/L (ref 3.5–5.3)
Sodium: 140 mmol/L (ref 135–146)
Total Bilirubin: 0.2 mg/dL (ref 0.2–1.2)
Total Protein: 6.8 g/dL (ref 6.1–8.1)

## 2018-07-01 LAB — SPECIMEN COMPROMISED

## 2018-07-01 NOTE — Telephone Encounter (Addendum)
Spoke with pt and she is aware  ----- Message from Guss Bunde, MD sent at 07/01/2018  3:12 PM EDT ----- Normal liver function tests.  Patient can start medication.

## 2018-07-09 ENCOUNTER — Ambulatory Visit: Payer: BLUE CROSS/BLUE SHIELD | Admitting: Licensed Clinical Social Worker

## 2018-07-16 ENCOUNTER — Other Ambulatory Visit: Payer: Self-pay | Admitting: *Deleted

## 2018-07-16 ENCOUNTER — Encounter: Payer: Self-pay | Admitting: *Deleted

## 2018-07-16 NOTE — Progress Notes (Signed)
Pt referred to Dr Kerin Perna for pelvic pain/endometriosis.  Referral sent via fax.

## 2018-07-21 ENCOUNTER — Ambulatory Visit (INDEPENDENT_AMBULATORY_CARE_PROVIDER_SITE_OTHER): Payer: BLUE CROSS/BLUE SHIELD

## 2018-07-21 DIAGNOSIS — M5440 Lumbago with sciatica, unspecified side: Secondary | ICD-10-CM

## 2018-07-21 DIAGNOSIS — M5441 Lumbago with sciatica, right side: Secondary | ICD-10-CM

## 2018-07-21 DIAGNOSIS — G8929 Other chronic pain: Secondary | ICD-10-CM

## 2018-07-21 DIAGNOSIS — R102 Pelvic and perineal pain: Secondary | ICD-10-CM | POA: Diagnosis not present

## 2018-07-21 MED ORDER — GADOBUTROL 1 MMOL/ML IV SOLN
10.0000 mL | Freq: Once | INTRAVENOUS | Status: AC | PRN
  Administered 2018-07-21: 10 mL via INTRAVENOUS

## 2018-08-01 ENCOUNTER — Ambulatory Visit (INDEPENDENT_AMBULATORY_CARE_PROVIDER_SITE_OTHER): Payer: BLUE CROSS/BLUE SHIELD | Admitting: Licensed Clinical Social Worker

## 2018-08-01 DIAGNOSIS — F3341 Major depressive disorder, recurrent, in partial remission: Secondary | ICD-10-CM

## 2018-08-06 ENCOUNTER — Ambulatory Visit: Payer: BLUE CROSS/BLUE SHIELD | Admitting: Licensed Clinical Social Worker

## 2018-08-06 ENCOUNTER — Encounter: Payer: Self-pay | Admitting: Physician Assistant

## 2018-08-06 ENCOUNTER — Ambulatory Visit (INDEPENDENT_AMBULATORY_CARE_PROVIDER_SITE_OTHER): Payer: BLUE CROSS/BLUE SHIELD | Admitting: Physician Assistant

## 2018-08-06 ENCOUNTER — Ambulatory Visit (INDEPENDENT_AMBULATORY_CARE_PROVIDER_SITE_OTHER): Payer: BLUE CROSS/BLUE SHIELD

## 2018-08-06 VITALS — BP 135/74 | HR 92 | Temp 98.8°F | Wt 247.0 lb

## 2018-08-06 DIAGNOSIS — J209 Acute bronchitis, unspecified: Secondary | ICD-10-CM

## 2018-08-06 DIAGNOSIS — R05 Cough: Secondary | ICD-10-CM

## 2018-08-06 DIAGNOSIS — R059 Cough, unspecified: Secondary | ICD-10-CM

## 2018-08-06 MED ORDER — PREDNISONE 50 MG PO TABS: ORAL_TABLET | ORAL | 0 refills | Status: DC

## 2018-08-06 MED ORDER — BENZONATATE 200 MG PO CAPS: 200.0000 mg | ORAL_CAPSULE | Freq: Three times a day (TID) | ORAL | 0 refills | Status: DC | PRN

## 2018-08-06 MED ORDER — AZITHROMYCIN 250 MG PO TABS: ORAL_TABLET | ORAL | 0 refills | Status: DC

## 2018-08-06 NOTE — Progress Notes (Signed)
Call pt: no pneumonia. Lung reflects bronchitis. Treatment plan stays the same.

## 2018-08-06 NOTE — Progress Notes (Signed)
   Subjective:    Patient ID: Maria Payne, female    DOB: 02/17/1991, 27 y.o.   MRN: [id number]  HPI  Patient is a 27 year old female with a history of asthma who presents to the clinic with 2 days of productive cough, shortness of breath and some wheezing.  She is using her asthma rescue inhaler every 6 hours.  She is using some over-the-counter Mucinex D.  She is using her Atrovent nasal spray.  She has had very little relief.  She is concerned because it is the holiday weekend.  She has had a low-grade fever around 99.  She has had no fever today.  She denies any body aches or chills.  She has not had any ear pain, sinus pressure.  She has had a faint sore throat.  She does admit that her boyfriend and her stepchildren have had upper respiratory infections.  .. Active Ambulatory Problems    Diagnosis Date Noted  . DERMATITIS, PERIORAL 05/17/2010  . 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 IIH (idiopathic intracranial hypertension) 09/10/2011  . MDD (major depressive disorder) 04/04/2012  . GAD (generalized anxiety disorder) 04/04/2012   Resolved Ambulatory Problems    Diagnosis Date Noted  . FOOT PAIN 09/25/2010  . Acute maxillary sinusitis 10/20/2010   Past Medical History:  Diagnosis Date  . Papilledema   . Pseudotumor cerebri      Review of Systems    see HPI.  Objective:   Physical Exam  Constitutional: She is oriented to person, place, and time. She appears well-developed and well-nourished.  HENT:  Head: Normocephalic and atraumatic.  Right Ear: External ear normal.  Left Ear: External ear normal.  Mouth/Throat: No oropharyngeal exudate.  TM's clear.  Tonsils removed. Oropharynx erythematous with some PNd.   Eyes: Conjunctivae are normal.  Cardiovascular: Normal rate and regular rhythm.  Pulmonary/Chest: Effort normal. She has no wheezes.  Some faint small crackles in left mid to upper lung.   Lymphadenopathy:    She has no cervical adenopathy.   Neurological: She is alert and oriented to person, place, and time.  Skin: No rash noted.  Psychiatric: She has a normal mood and affect. Her behavior is normal.          Assessment & Plan:  [name] KitchenMarland KitchenHazeline was seen today for cough.  Diagnoses and all orders for this visit:  Acute bronchitis, unspecified organism -     benzonatate (TESSALON) 200 MG capsule; Take 1 capsule (200 mg total) by mouth 3 (three) times daily as needed for cough. -     predniSONE (DELTASONE) 50 MG tablet; One tab PO daily for 5 days. -     azithromycin (ZITHROMAX) 250 MG tablet; 2 tablets now and then one tablet for 4 days.  Cough -     DG Chest 2 View -     benzonatate (TESSALON) 200 MG capsule; Take 1 capsule (200 mg total) by mouth 3 (three) times daily as needed for cough. -     azithromycin (ZITHROMAX) 250 MG tablet; 2 tablets now and then one tablet for 4 days.  concerned for pneumonia after auscultation of lungs. Will get CXR.   Likely bronchitis. Discussed prednisone and tessalon pearls to start now with albuterol inhaler every 4-6 hours. Rest and hydrate. deslym for cough at time.   Due to thanksgiving weekend if not improving or worsening can start zpak.   STAT CXR showed peribronchial cuffing reflecting bronchitis. Treatment plan stays the same.

## 2018-08-06 NOTE — Patient Instructions (Signed)

## 2018-08-11 ENCOUNTER — Encounter: Payer: Self-pay | Admitting: Family Medicine

## 2018-08-11 ENCOUNTER — Ambulatory Visit (INDEPENDENT_AMBULATORY_CARE_PROVIDER_SITE_OTHER): Payer: BLUE CROSS/BLUE SHIELD | Admitting: Family Medicine

## 2018-08-11 VITALS — BP 148/76 | HR 86 | Temp 98.5°F | Ht 68.0 in | Wt 247.0 lb

## 2018-08-11 DIAGNOSIS — J40 Bronchitis, not specified as acute or chronic: Secondary | ICD-10-CM | POA: Diagnosis not present

## 2018-08-11 DIAGNOSIS — J329 Chronic sinusitis, unspecified: Secondary | ICD-10-CM | POA: Diagnosis not present

## 2018-08-11 DIAGNOSIS — R197 Diarrhea, unspecified: Secondary | ICD-10-CM | POA: Diagnosis not present

## 2018-08-11 MED ORDER — METHYLPREDNISOLONE ACETATE 80 MG/ML IJ SUSP
80.0000 mg | Freq: Once | INTRAMUSCULAR | Status: AC
  Administered 2018-08-11: 80 mg via INTRAMUSCULAR

## 2018-08-11 MED ORDER — AMOXICILLIN-POT CLAVULANATE 875-125 MG PO TABS: 1.0000 | ORAL_TABLET | Freq: Two times a day (BID) | ORAL | 0 refills | Status: DC

## 2018-08-11 NOTE — Progress Notes (Signed)
Subjective:    Patient ID: Maria Payne, female    DOB: 1991-03-20, 27 y.o.   MRN: [id number]  HPI 27 yo c/o of URI sxs x 10 days.  She was seen on 11/27 and treated with zpack and prednisone and tessalon. She still has a cough but no fever.  Cough so much she is getting chills. Saw some green mucous.  She had an x-ray on the 27th as well just showing some subsegmental atelectasis with peribronchial cuffing consistent with bronchitis.  No sign of pneumonia.  She just feels extremely weak and tired.  She is having a lot of muscle aches.  She has been able to keep fluid down.  Though she did have some diarrhea yesterday but several family members also had a stomach bug. She has had some discomfort in her chest this AM.    Review of Systems  BP (!) 148/76   Pulse 86   Temp 98.5 F (36.9 C)   Ht 5\' 8"  (1.727 m)   Wt 247 lb (112 kg)   SpO2 99%   BMI 37.56 kg/m     Allergies  Allergen Reactions  . Latex     Other reaction(s): Other Diagnosed with blood test  . Nsaids     Other reaction(s): Other contraindication  . Codeine   . Hydrocodone-Acetaminophen Rash    Headaches Morphine, Dilaudid OK  . Oxycodone Rash    Headaches  . Tape Rash    welps    Past Medical History:  Diagnosis Date  . 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 Had diagnostic cerebral arteriogram.     . GAD (generalized anxiety disorder) 04/04/2012  . Papilledema    Diamox  . Pseudotumor cerebri     Past Surgical History:  Procedure Laterality Date  . ANGIOPLASTY  2012   ? cerebral artery   . SLEEVE GASTROPLASTY  05/2014   Dr. Evorn Gong   . Stent placed  August 2014   Stenosis right transverse sigmoid junction,[facility]  . VP shunt  11/2013   Done at [facility] History   Socioeconomic History  . Marital status: Divorced    Spouse name: Not on file  . Number of children: 0  . Years of education: Not on file  . Highest education level: Not on file  Occupational  History  . Occupation: unemployed.   . Occupation: CUSTOMER SERVICE    Employer: [employer]  Social Needs  . Financial resource strain: Not on file  . Food insecurity:    Worry: Not on file    Inability: Not on file  . Transportation needs:    Medical: Not on file    Non-medical: Not on file  Tobacco Use  . Smoking status: Never Smoker  . Smokeless tobacco: Never Used  Substance and Sexual Activity  . Alcohol use: Yes    Alcohol/week: 0.0 standard drinks    Comment: occ  . Drug use: No  . Sexual activity: Yes    Partners: Male    Birth control/protection: Pill  Lifestyle  . Physical activity:    Days per week: Not on file    Minutes per session: Not on file  . Stress: Not on file  Relationships  . Social connections:    Talks on phone: Not on file    Gets together: Not on file    Attends religious service: Not on file    Active member of club or organization: Not  on file    Attends meetings of clubs or organizations: Not on file    Relationship status: Not on file  . Intimate partner violence:    Fear of current or ex partner: Not on file    Emotionally abused: Not on file    Physically abused: Not on file    Forced sexual activity: Not on file  Other Topics Concern  . Not on file  Social History Narrative   seperated from husband in nov 2014.  Still legally married.     Family History  Problem Relation Age of Onset  . Hypertension Father   . Heart disease Father     Outpatient Encounter Medications as of 08/11/2018  Medication Sig  . acetaZOLAMIDE (DIAMOX) 500 MG capsule TK ONE C PO BID  . AIMOVIG 140 MG/ML SOAJ ADM 1 ML [place] Q 30 DAYS  . albuterol (PROVENTIL HFA;VENTOLIN HFA) 108 (90 Base) MCG/ACT inhaler Inhale 2 puffs into the lungs every 4 (four) hours as needed for wheezing or shortness of breath.  . AMBULATORY NON FORMULARY MEDICATION Medication Name: Probiotic; Yeast guard  . aspirin 81 MG chewable tablet Chew 81 mg by mouth. Chew 2 tablets by mouth two  times a day  . benzonatate (TESSALON) 200 MG capsule Take 1 capsule (200 mg total) by mouth 3 (three) times daily as needed for cough.  . Elagolix Sodium (ORILISSA) 150 MG TABS Take 1 tablet by mouth daily.  [name] Kitchen escitalopram (LEXAPRO) 20 MG tablet Take 1 tablet (20 mg total) by mouth daily.  [name] Kitchen ipratropium (ATROVENT) 0.06 % nasal spray Place 2 sprays into both nostrils 4 (four) times daily.  . Multiple Vitamins-Minerals (THERA-M) TABS Take 1 tablet by mouth.  . norethindrone-ethinyl estradiol (MICROGESTIN,JUNEL,LOESTRIN) 1-20 MG-MCG tablet TAKE 1 TABLET BY MOUTH DAILY  . pantoprazole (PROTONIX) 20 MG tablet Take 20 mg by mouth daily.  [name] Kitchen triamcinolone cream (KENALOG) 0.1 % Apply 1 application topically 2 (two) times daily.  [name] Kitchen amoxicillin-clavulanate (AUGMENTIN) 875-125 MG tablet Take 1 tablet by mouth 2 (two) times daily.  . [DISCONTINUED] azithromycin (ZITHROMAX) 250 MG tablet 2 tablets now and then one tablet for 4 days.  . [DISCONTINUED] predniSONE (DELTASONE) 50 MG tablet One tab PO daily for 5 days.  . [EXPIRED] methylPREDNISolone acetate (DEPO-MEDROL) injection 80 mg    No facility-administered encounter medications on file as of 08/11/2018.         Objective:   Physical Exam  Constitutional: She is oriented to person, place, and time. She appears well-developed and well-nourished.  HENT:  Head: Normocephalic and atraumatic.  Right Ear: External ear normal.  Left Ear: External ear normal.  Nose: Nose normal.  Mouth/Throat: Oropharynx is clear and moist.  TMs and canals are clear.   Eyes: Pupils are equal, round, and reactive to light. Conjunctivae and EOM are normal.  Neck: Neck supple. No thyromegaly present.  Cardiovascular: Normal rate, regular rhythm and normal heart sounds.  Pulmonary/Chest: Effort normal and breath sounds normal. She has no wheezes.  Lymphadenopathy:    She has no cervical adenopathy.  Neurological: She is alert and oriented to person, place, and time.   Skin: Skin is warm and dry.  Psychiatric: She has a normal mood and affect.        Assessment & Plan:  Acute sinusitis/bronchitis- not improving.  Will tx with augmentin and prednisone.  Call if not better in one week. Chest is clear on exam.   Diarrhea - likley from GI bug. Hydrated well and rest.  Work note provided.

## 2018-08-25 ENCOUNTER — Ambulatory Visit: Payer: Self-pay | Admitting: Obstetrics & Gynecology

## 2018-08-28 ENCOUNTER — Ambulatory Visit: Payer: Self-pay | Admitting: Family Medicine

## 2018-08-28 NOTE — Progress Notes (Deleted)
Subjective:    CC: Month follow-up for mood.  HPI:  27 year old female here today for 40-month follow-up for depression/anxiety-currently on Lexapro daily.    Past medical history, Surgical history, Family history not pertinant except as noted below, Social history, Allergies, and medications have been entered into the medical record, reviewed, and corrections made.   Review of Systems: No fevers, chills, night sweats, weight loss, chest pain, or shortness of breath.   Objective:    General: Well Developed, well nourished, and in no acute distress.  Neuro: Alert and oriented x3, extra-ocular muscles intact, sensation grossly intact.  HEENT: Normocephalic, atraumatic  Skin: Warm and dry, no rashes. Cardiac: Regular rate and rhythm, no murmurs rubs or gallops, no lower extremity edema.  Respiratory: Clear to auscultation bilaterally. Not using accessory muscles, speaking in full sentences.   Impression and Recommendations:

## 2018-09-04 ENCOUNTER — Other Ambulatory Visit: Payer: Self-pay | Admitting: Family Medicine

## 2018-09-04 NOTE — Telephone Encounter (Signed)
NEEDS APPOINTMENT 

## 2018-09-05 ENCOUNTER — Ambulatory Visit (INDEPENDENT_AMBULATORY_CARE_PROVIDER_SITE_OTHER): Payer: BLUE CROSS/BLUE SHIELD | Admitting: Family Medicine

## 2018-09-05 ENCOUNTER — Encounter: Payer: Self-pay | Admitting: Family Medicine

## 2018-09-05 VITALS — BP 130/68 | HR 95 | Temp 98.3°F | Ht 68.0 in | Wt 246.0 lb

## 2018-09-05 DIAGNOSIS — R509 Fever, unspecified: Secondary | ICD-10-CM

## 2018-09-05 DIAGNOSIS — Z20828 Contact with and (suspected) exposure to other viral communicable diseases: Secondary | ICD-10-CM

## 2018-09-05 DIAGNOSIS — J111 Influenza due to unidentified influenza virus with other respiratory manifestations: Secondary | ICD-10-CM

## 2018-09-05 DIAGNOSIS — R053 Chronic cough: Secondary | ICD-10-CM

## 2018-09-05 DIAGNOSIS — R05 Cough: Secondary | ICD-10-CM | POA: Diagnosis not present

## 2018-09-05 DIAGNOSIS — R69 Illness, unspecified: Secondary | ICD-10-CM | POA: Diagnosis not present

## 2018-09-05 LAB — POCT INFLUENZA A/B
Influenza A, POC: NEGATIVE
Influenza B, POC: NEGATIVE

## 2018-09-05 MED ORDER — FLUTICASONE-SALMETEROL 250-50 MCG/DOSE IN AEPB: 1.0000 | INHALATION_SPRAY | Freq: Two times a day (BID) | RESPIRATORY_TRACT | 3 refills | Status: DC

## 2018-09-05 MED ORDER — OSELTAMIVIR PHOSPHATE 75 MG PO CAPS: 75.0000 mg | ORAL_CAPSULE | Freq: Every day | ORAL | 0 refills | Status: DC

## 2018-09-05 NOTE — Progress Notes (Signed)
Acute Office Visit  Subjective:    Patient ID: Maria Payne, female    DOB: Dec 21, 1990, 27 y.o.   MRN: [id number]  Chief Complaint  Patient presents with  . Fever    pt reports that she did have a fever last night of 101. has not had any fevers since. she has also been around ppl who were + for flu. she has been using advil cold and sinus Q3h just to get thru working today    HPI Patient is in today for 2 days of fever, cough and sore throat.  She feels like it started Christmas night.  She ran a fever to 101 last night but today has been taking lots of cough and cold medications.  She did try to go to work today but just feels completely tired and exhausted.  Also wanted to let me know that the cough that she had when I saw her at the beginning of the month has persisted.  She had a chest x-ray just showing bronchitis and had actually been on 2 rounds of antibiotics and 2 rounds of prednisone.  She said that the cough actually never completely went away.  Mostly dry.  She says when she uses her albuterol she will get some temporary relief may be for an hour or 2 but then it seems to come right back.  Past Medical History:  Diagnosis Date  . 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 on 08/21/11. Had diagnostic cerebral arteriogram.     . GAD (generalized anxiety disorder) 04/04/2012  . Papilledema    Diamox  . Pseudotumor cerebri     Past Surgical History:  Procedure Laterality Date  . ANGIOPLASTY  2012   ? cerebral artery   . SLEEVE GASTROPLASTY  05/2014   Dr. Evorn Gong   . Stent placed  August 2014   Stenosis right transverse sigmoid junction,[facility]  . VP shunt  11/2013   Done at [facility]    Family History  Problem Relation Age of Onset  . Hypertension Father   . Heart disease Father     Social History   Socioeconomic History  . Marital status: Divorced    Spouse name: Not on file  . Number of children: 0  . Years of education: Not on file  . Highest  education level: Not on file  Occupational History  . Occupation: unemployed.   . Occupation: CUSTOMER SERVICE    Employer: [employer]  Social Needs  . Financial resource strain: Not on file  . Food insecurity:    Worry: Not on file    Inability: Not on file  . Transportation needs:    Medical: Not on file    Non-medical: Not on file  Tobacco Use  . Smoking status: Never Smoker  . Smokeless tobacco: Never Used  Substance and Sexual Activity  . Alcohol use: Yes    Alcohol/week: 0.0 standard drinks    Comment: occ  . Drug use: No  . Sexual activity: Yes    Partners: Male    Birth control/protection: Pill  Lifestyle  . Physical activity:    Days per week: Not on file    Minutes per session: Not on file  . Stress: Not on file  Relationships  . Social connections:    Talks on phone: Not on file    Gets together: Not on file    Attends religious service: Not on file    Active member of club or organization:  Not on file    Attends meetings of clubs or organizations: Not on file    Relationship status: Not on file  . Intimate partner violence:    Fear of current or ex partner: Not on file    Emotionally abused: Not on file    Physically abused: Not on file    Forced sexual activity: Not on file  Other Topics Concern  . Not on file  Social History Narrative   seperated from husband in nov 2014.  Still legally married.     Outpatient Medications Prior to Visit  Medication Sig Dispense Refill  . acetaZOLAMIDE (DIAMOX) 500 MG capsule Take 1 capsule by mouth 2 (two) times daily.    [name] Kitchen AIMOVIG 140 MG/ML SOAJ ADM 1 ML [place] Q 30 DAYS  3  . albuterol (PROVENTIL HFA;VENTOLIN HFA) 108 (90 Base) MCG/ACT inhaler Inhale 2 puffs into the lungs every 4 (four) hours as needed for wheezing or shortness of breath. 1 Inhaler 1  . AMBULATORY NON FORMULARY MEDICATION Medication Name: Probiotic; Yeast guard    . aspirin 81 MG chewable tablet Chew 81 mg by mouth. Chew 2 tablets by mouth two times a  day    . Elagolix Sodium (ORILISSA) 150 MG TABS Take 1 tablet by mouth daily. 30 tablet 2  . escitalopram (LEXAPRO) 20 MG tablet Take 1 tablet (20 mg total) by mouth daily. MUST MAKE APPOINTMENT 30 tablet 0  . ipratropium (ATROVENT) 0.06 % nasal spray Place 2 sprays into both nostrils 4 (four) times daily. 15 mL 1  . Multiple Vitamins-Minerals (THERA-M) TABS Take 1 tablet by mouth.    . norethindrone-ethinyl estradiol (MICROGESTIN,JUNEL,LOESTRIN) 1-20 MG-MCG tablet TAKE 1 TABLET BY MOUTH DAILY 3 Package 3  . pantoprazole (PROTONIX) 20 MG tablet Take 20 mg by mouth daily.    . pantoprazole (PROTONIX) 40 MG tablet TAKE 1 TABLET(40 MG) BY MOUTH EVERY MORNING 90 tablet 1  . triamcinolone cream (KENALOG) 0.1 % Apply 1 application topically 2 (two) times daily. 30 g 0  . acetaZOLAMIDE (DIAMOX) 500 MG capsule TK ONE C PO BID  6   No facility-administered medications prior to visit.     Allergies  Allergen Reactions  . Latex     Other reaction(s): Other Diagnosed with blood test  . Nsaids     Other reaction(s): Other contraindication  . Codeine   . Hydrocodone-Acetaminophen Rash    Headaches Morphine, Dilaudid OK  . Oxycodone Rash    Headaches  . Tape Rash    welps    ROS     Objective:    Physical Exam  Constitutional: She is oriented to person, place, and time. She appears well-developed and well-nourished.  HENT:  Head: Normocephalic and atraumatic.  Right Ear: External ear normal.  Left Ear: External ear normal.  Nose: Nose normal.  Mouth/Throat: Oropharynx is clear and moist.  TMs and canals are clear.   Eyes: Pupils are equal, round, and reactive to light. Conjunctivae and EOM are normal.  Neck: Neck supple. No thyromegaly present.  Cardiovascular: Normal rate, regular rhythm and normal heart sounds.  Pulmonary/Chest: Effort normal and breath sounds normal. She has no wheezes.  Lymphadenopathy:    She has no cervical adenopathy.  Neurological: She is alert and  oriented to person, place, and time.  Skin: Skin is warm and dry.  Psychiatric: She has a normal mood and affect.    BP 130/68   Pulse 95   Temp 98.3 F (36.8 C)  Ht 5\' 8"  (1.727 m)   Wt 246 lb (111.6 kg)   SpO2 100%   BMI 37.40 kg/m  Wt Readings from Last 3 Encounters:  09/05/18 246 lb (111.6 kg)  08/11/18 247 lb (112 kg)  08/06/18 247 lb (112 kg)    There are no preventive care reminders to display for this patient.  There are no preventive care reminders to display for this patient.   Lab Results  Component Value Date   TSH 3.720 03/23/2014   Lab Results  Component Value Date   WBC 6.6 11/07/2015   HGB 12.3 11/07/2015   HCT 37.6 11/07/2015   MCV 86.8 11/07/2015   PLT 350 11/07/2015   Lab Results  Component Value Date   NA 140 06/30/2018   K 3.8 06/30/2018   CO2 20 06/30/2018   GLUCOSE 76 06/30/2018   BUN 17 06/30/2018   CREATININE 0.85 06/30/2018   BILITOT 0.2 06/30/2018   ALKPHOS 53 11/07/2015   AST 13 06/30/2018   ALT 9 06/30/2018   PROT 6.8 06/30/2018   ALBUMIN 3.9 11/07/2015   CALCIUM 8.8 06/30/2018   Lab Results  Component Value Date   CHOL 191 03/23/2014   Lab Results  Component Value Date   HDL 38 (L) 03/23/2014   Lab Results  Component Value Date   LDLCALC 124 (H) 03/23/2014   Lab Results  Component Value Date   TRIG 147 03/23/2014   Lab Results  Component Value Date   CHOLHDL 5.0 03/23/2014   Lab Results  Component Value Date   HGBA1C 5.3 11/04/2017       Assessment & Plan:   Problem List Items Addressed This Visit    None    Visit Diagnoses    Fever, unspecified fever cause    -  Primary   Relevant Orders   POCT Influenza A/B (Completed)   Chronic cough       Exposure to the flu       Influenza-like illness         Flu swab is negative.  Recommend symptomatic care.  If not improving then please let me know.  As far as the chronic cough is concerned it sounds like it is just bronchial inflammation I think she  would benefit from switching to PRN albuterol which does give her temporary relief to Advair.  New prescription sent to pharmacy hopefully this will improve over the next couple of weeks and if not she can let me know.   Meds ordered this encounter  Medications  . Fluticasone-Salmeterol (ADVAIR DISKUS) 250-50 MCG/DOSE AEPB    Sig: Inhale 1 puff into the lungs 2 (two) times daily.    Dispense:  1 each    Refill:  3  . oseltamivir (TAMIFLU) 75 MG capsule    Sig: Take 1 capsule (75 mg total) by mouth daily. X 10 days. Exposure to the flu    Dispense:  10 capsule    Refill:  0     [name], MD

## 2018-09-08 ENCOUNTER — Telehealth: Payer: Self-pay

## 2018-09-08 NOTE — Telephone Encounter (Signed)
[facility]ide called and left a message stating the medication that was prescribed needs a PA.

## 2018-09-09 NOTE — Telephone Encounter (Signed)
Waiting on form to be faxed for medication.

## 2018-09-11 ENCOUNTER — Ambulatory Visit (INDEPENDENT_AMBULATORY_CARE_PROVIDER_SITE_OTHER): Payer: Self-pay

## 2018-09-11 DIAGNOSIS — R300 Dysuria: Secondary | ICD-10-CM

## 2018-09-11 LAB — POCT URINALYSIS DIPSTICK
Bilirubin, UA: NEGATIVE
Blood, UA: NEGATIVE
Glucose, UA: NEGATIVE
Ketones, UA: NEGATIVE
Leukocytes, UA: NEGATIVE
Nitrite, UA: NEGATIVE
Protein, UA: NEGATIVE
Spec Grav, UA: 1.01 (ref 1.010–1.025)
Urobilinogen, UA: 0.2 E.U./dL
pH, UA: 5 (ref 5.0–8.0)

## 2018-09-11 NOTE — Progress Notes (Addendum)
Pt c/o frequent/painful urination and back pain. UA dipstick was normal. Culture sent. Pt is aware we will call her with results and treat if necessary.

## 2018-09-11 NOTE — Telephone Encounter (Signed)
Information hast been sent to insurance for [employer]. Waiting on a response from insurance.

## 2018-09-11 NOTE — Telephone Encounter (Signed)
Received a message from [facility] that [place] is not required.

## 2018-09-13 ENCOUNTER — Other Ambulatory Visit: Payer: Self-pay | Admitting: Family Medicine

## 2018-09-15 LAB — URINE CULTURE

## 2018-10-02 ENCOUNTER — Telehealth: Payer: Self-pay | Admitting: Family Medicine

## 2018-10-02 NOTE — Telephone Encounter (Signed)
Patient called and stated that her insurance will cover Victoza. Patient wants to know if you can send a 30 day supply to [facility]. Does she need a follow up after starting the medication or before? Last OV was 09/05/2018 for Fever. Please advise.

## 2018-10-03 ENCOUNTER — Other Ambulatory Visit: Payer: Self-pay | Admitting: *Deleted

## 2018-10-03 MED ORDER — LIRAGLUTIDE 18 MG/3ML ~~LOC~~ SOPN: PEN_INJECTOR | SUBCUTANEOUS | 1 refills | Status: DC

## 2018-10-03 NOTE — Telephone Encounter (Signed)
OK, new rx sent.  

## 2018-10-13 ENCOUNTER — Other Ambulatory Visit: Payer: Self-pay | Admitting: Family Medicine

## 2018-11-12 ENCOUNTER — Encounter: Payer: Self-pay | Admitting: Family Medicine

## 2018-11-27 ENCOUNTER — Ambulatory Visit: Payer: Self-pay | Admitting: Family Medicine

## 2018-12-05 ENCOUNTER — Other Ambulatory Visit: Payer: Self-pay | Admitting: Family Medicine

## 2018-12-08 NOTE — Telephone Encounter (Signed)
What dose is she actually using.

## 2018-12-08 NOTE — Telephone Encounter (Signed)
Called and lvm asking pt to rtn call about Victoza dosage.Maria Payne, Lahoma Crocker, CMA

## 2018-12-08 NOTE — Telephone Encounter (Signed)
She states she is on 1.8 dose

## 2018-12-10 ENCOUNTER — Encounter: Payer: Self-pay | Admitting: Family Medicine

## 2018-12-11 ENCOUNTER — Telehealth: Payer: Self-pay | Admitting: Family Medicine

## 2018-12-11 MED ORDER — DESOGESTREL-ETHINYL ESTRADIOL 0.15-0.02/0.01 MG (21/5) PO TABS: 1.0000 | ORAL_TABLET | Freq: Every day | ORAL | 3 refills | Status: DC

## 2018-12-11 NOTE — Telephone Encounter (Signed)
Received a fax from [facility] that [place] has been approved from 12/11/2018 through 12/11/2019. Pharmacy aware and form sent to scan.

## 2018-12-23 ENCOUNTER — Other Ambulatory Visit: Payer: Self-pay

## 2018-12-23 ENCOUNTER — Ambulatory Visit (INDEPENDENT_AMBULATORY_CARE_PROVIDER_SITE_OTHER): Payer: No Typology Code available for payment source | Admitting: Family Medicine

## 2018-12-23 ENCOUNTER — Encounter: Payer: Self-pay | Admitting: Family Medicine

## 2018-12-23 VITALS — Ht 68.0 in

## 2018-12-23 DIAGNOSIS — T50905A Adverse effect of unspecified drugs, medicaments and biological substances, initial encounter: Secondary | ICD-10-CM

## 2018-12-23 DIAGNOSIS — X58XXXA Exposure to other specified factors, initial encounter: Secondary | ICD-10-CM

## 2018-12-23 DIAGNOSIS — Z3009 Encounter for other general counseling and advice on contraception: Secondary | ICD-10-CM

## 2018-12-23 MED ORDER — NORETHIN-ETH ESTRAD-FE BIPHAS 1 MG-10 MCG / 10 MCG PO TABS: 1.0000 | ORAL_TABLET | Freq: Every day | ORAL | 11 refills | Status: DC

## 2018-12-23 NOTE — Progress Notes (Signed)
Pt stated that she started the new OPC on Sunday and began cramping mood swings and tender breast all on the 1st day. She would like to discuss another something else and unsure if she would like to do an oral or something else. Maryruth Eve, Lahoma Crocker, CMA

## 2018-12-23 NOTE — Progress Notes (Signed)
Virtual Visit via Video Note  I connected with Maria Payne on 12/23/18 at  3:20 PM EDT by a video enabled telemedicine application and verified that I am speaking with the correct person using two identifiers.   I discussed the limitations of evaluation and management by telemedicine and the availability of in person appointments. The patient expressed understanding and agreed to proceed.  Subjective:    CC: side effects of pill  HPI:  Pt stated that she started the new OPC on Sunday and began having pelvic cramping, mood swings and tender breast all on the 1st day. We had recently changed her pill bc of vaginal dryness and irritation. Says it felt like always had a yeast infection.  She would like to discuss another something else and unsure if she would like to do an oral or something else.    Past medical history, Surgical history, Family history not pertinant except as noted below, Social history, Allergies, and medications have been entered into the medical record, reviewed, and corrections made.   Review of Systems: No fevers, chills, night sweats, weight loss, chest pain, or shortness of breath.   Objective:    General: Speaking clearly in complete sentences without any shortness of breath.  Alert and oriented x3.  Normal judgment. No apparent acute distress. Well groomed.     Impression and Recommendations:    Side effects of OCP.  Discussed options.  Will change to Lo_loestrin.  Also discussed Nuvaring as a low dose option. She is getting married soon and plans on having a child about a year from now. Her fiance already has 2 kids. After that she wants to stay off B.C nad he will likely get a vasectomy. Call if any persistant symptoms.    Contraceptive counesling - see note above.    I discussed the assessment and treatment plan with the patient. The patient was provided an opportunity to ask questions and all were answered. The patient agreed with the plan and  demonstrated an understanding of the instructions.   The patient was advised to call back or seek an in-person evaluation if the symptoms worsen or if the condition fails to improve as anticipated.   [name], MD

## 2018-12-29 ENCOUNTER — Encounter: Payer: Self-pay | Admitting: *Deleted

## 2019-01-02 ENCOUNTER — Encounter: Payer: Self-pay | Admitting: Family Medicine

## 2019-01-05 NOTE — Telephone Encounter (Signed)
Left a message to schedule appointment

## 2019-01-13 ENCOUNTER — Other Ambulatory Visit: Payer: Self-pay | Admitting: Family Medicine

## 2019-01-15 ENCOUNTER — Encounter: Payer: Self-pay | Admitting: Family Medicine

## 2019-01-15 ENCOUNTER — Ambulatory Visit (INDEPENDENT_AMBULATORY_CARE_PROVIDER_SITE_OTHER): Payer: No Typology Code available for payment source | Admitting: Family Medicine

## 2019-01-15 VITALS — Ht 68.0 in

## 2019-01-15 DIAGNOSIS — N390 Urinary tract infection, site not specified: Secondary | ICD-10-CM | POA: Diagnosis not present

## 2019-01-15 DIAGNOSIS — N3000 Acute cystitis without hematuria: Secondary | ICD-10-CM

## 2019-01-15 MED ORDER — NITROFURANTOIN MONOHYD MACRO 100 MG PO CAPS: 100.0000 mg | ORAL_CAPSULE | Freq: Every day | ORAL | 5 refills | Status: DC

## 2019-01-15 MED ORDER — CEPHALEXIN 500 MG PO CAPS: 500.0000 mg | ORAL_CAPSULE | Freq: Two times a day (BID) | ORAL | 0 refills | Status: DC

## 2019-01-15 NOTE — Progress Notes (Signed)
Virtual Visit via Video Note  I connected with Maria Payne on 01/15/19 at  8:50 AM EDT by a video enabled telemedicine application and verified that I am speaking with the correct person using two identifiers.   I discussed the limitations of evaluation and management by telemedicine and the availability of in person appointments. The patient expressed understanding and agreed to proceed.  Pt was at home and I was in my office for the virtual visit.     Subjective:    CC: Possible uti  HPI:  Pt reports that her sxs began over the weekend. She just got married. She said it feels like bladder irritation. She has pain w/urination and this lasts for 2 hours after she urinates. C/o of abdominal,flank and back pain She notices specks of blood when she wipes. Denies f/s/c/n/v/d.  I asked about if this is common for her to experience getting UTI's after sex she felt like when she takes birth control the shift in her hormones causes her to have recurrent UTI's or bladder issues. She would like a referral to Urology.  She does notice a pattern that it does seem to happen more after intercourse.  Looking back she has had at least 4 UTIs in the last year.  Though she reports that her symptoms have been much more frequent than just those 4 episodes.  She has not always had a positive culture with those UTIs either.    Past medical history, Surgical history, Family history not pertinant except as noted below, Social history, Allergies, and medications have been entered into the medical record, reviewed, and corrections made.   Review of Systems: No fevers, chills, night sweats, weight loss, chest pain, or shortness of breath.   Objective:    General: Speaking clearly in complete sentences without any shortness of breath.  Alert and oriented x3.  Normal judgment. No apparent acute distress. Well groomed.     Impression and Recommendations:    UTI -for current urinary tract symptoms we will  treat with Keflex 500 mg twice a day.  Make sure drinking plenty of water.Recommend UA and culture at the lab if not better by Monday.   Recurrent UTI-we discussed treatment options including postcoital single tab treatment, treating with a full course when symptoms onset, or daily prophylaxis.  She actually recently got married this past weekend and so has been having more intercourse.  Because she also reports times where she has had symptoms that feel like a UTI and has had negative cultures in the past and sometimes will resolve on its own and I do wonder if she could have a component of interstitial cystitis may be interspersed with some actual urinary tract infections.  She also has a here history of irritable bowel syndrome.  We discussed possibility of referral to urology to help [place] better diagnose her.  In the meantime work on a put her on daily prophylaxis until she gets in with urology.  With the current COVID crisis I am not sure about availability of appointments for low risk individuals.     I discussed the assessment and treatment plan with the patient. The patient was provided an opportunity to ask questions and all were answered. The patient agreed with the plan and demonstrated an understanding of the instructions.   The patient was advised to call back or seek an in-person evaluation if the symptoms worsen or if the condition fails to improve as anticipated.   [name], MD

## 2019-01-15 NOTE — Progress Notes (Signed)
urPt reports that her sxs began over the weekend. She just got married. She said it feels like bladder irritation. She has pain w/urination and this lasts for 2 hours after she urinates.  Asked if she has any abdominal,flank or back pain she stated that she has all of the above.  She notices specks of blood when she wipes. Denies f/s/c/n/v/d.  I asked about if this is common for her to experience getting UTI's after sex she felt like when she takes birth control the shift in her hormones causes her to have recurrent UTI's or bladder issues. She would like a referral to Urology.  Maria Payne, Lahoma Crocker, CMA

## 2019-01-23 ENCOUNTER — Encounter: Payer: Self-pay | Admitting: Family Medicine

## 2019-03-10 ENCOUNTER — Encounter: Payer: Self-pay | Admitting: Family Medicine

## 2019-03-30 ENCOUNTER — Encounter: Payer: Self-pay | Admitting: Family Medicine

## 2019-03-31 ENCOUNTER — Other Ambulatory Visit: Payer: Self-pay | Admitting: Certified Nurse Midwife

## 2019-03-31 DIAGNOSIS — O219 Vomiting of pregnancy, unspecified: Secondary | ICD-10-CM

## 2019-03-31 MED ORDER — PROMETHAZINE HCL 12.5 MG PO TABS: 12.5000 mg | ORAL_TABLET | Freq: Four times a day (QID) | ORAL | 1 refills | Status: DC | PRN

## 2019-04-01 ENCOUNTER — Telehealth: Payer: Self-pay

## 2019-04-01 NOTE — Telephone Encounter (Signed)
Pt called stating that she never received the Phenergan that Darrol Poke, CNM called in for her yesterday. I checked in her chart and it was received by pharmacy. Pt is going to pick up medication. Pt states she has had vomiting and diarrhea 6 times in the last 24 hours. I instructed pt to take the Phenergan to see if it helps and to call us if it doesn't get better. Pt aware that if it continues or worsens she may need to get IV fluids. Pt expressed understanding and states she will call us if symptoms don't improve.

## 2019-04-05 DIAGNOSIS — Z3A01 Less than 8 weeks gestation of pregnancy: Secondary | ICD-10-CM | POA: Diagnosis not present

## 2019-04-05 DIAGNOSIS — R1011 Right upper quadrant pain: Secondary | ICD-10-CM | POA: Diagnosis not present

## 2019-04-05 DIAGNOSIS — Z793 Long term (current) use of hormonal contraceptives: Secondary | ICD-10-CM | POA: Diagnosis not present

## 2019-04-05 DIAGNOSIS — Z91040 Latex allergy status: Secondary | ICD-10-CM | POA: Diagnosis not present

## 2019-04-05 DIAGNOSIS — O26891 Other specified pregnancy related conditions, first trimester: Secondary | ICD-10-CM | POA: Diagnosis not present

## 2019-04-05 DIAGNOSIS — I10 Essential (primary) hypertension: Secondary | ICD-10-CM | POA: Diagnosis not present

## 2019-04-05 DIAGNOSIS — F419 Anxiety disorder, unspecified: Secondary | ICD-10-CM | POA: Diagnosis not present

## 2019-04-05 DIAGNOSIS — Z888 Allergy status to other drugs, medicaments and biological substances status: Secondary | ICD-10-CM | POA: Diagnosis not present

## 2019-04-05 DIAGNOSIS — O2341 Unspecified infection of urinary tract in pregnancy, first trimester: Secondary | ICD-10-CM | POA: Diagnosis not present

## 2019-04-05 DIAGNOSIS — R1031 Right lower quadrant pain: Secondary | ICD-10-CM | POA: Diagnosis not present

## 2019-04-05 DIAGNOSIS — Z7982 Long term (current) use of aspirin: Secondary | ICD-10-CM | POA: Diagnosis not present

## 2019-04-05 DIAGNOSIS — F329 Major depressive disorder, single episode, unspecified: Secondary | ICD-10-CM | POA: Diagnosis not present

## 2019-04-05 DIAGNOSIS — O99341 Other mental disorders complicating pregnancy, first trimester: Secondary | ICD-10-CM | POA: Diagnosis not present

## 2019-04-05 DIAGNOSIS — Z79899 Other long term (current) drug therapy: Secondary | ICD-10-CM | POA: Diagnosis not present

## 2019-04-05 DIAGNOSIS — Z885 Allergy status to narcotic agent status: Secondary | ICD-10-CM | POA: Diagnosis not present

## 2019-04-05 DIAGNOSIS — K219 Gastro-esophageal reflux disease without esophagitis: Secondary | ICD-10-CM | POA: Diagnosis not present

## 2019-04-13 ENCOUNTER — Encounter: Payer: Self-pay | Admitting: Family Medicine

## 2019-04-14 DIAGNOSIS — Z1159 Encounter for screening for other viral diseases: Secondary | ICD-10-CM | POA: Diagnosis not present

## 2019-04-23 ENCOUNTER — Other Ambulatory Visit (HOSPITAL_COMMUNITY)
Admission: RE | Admit: 2019-04-23 | Discharge: 2019-04-23 | Disposition: A | Discharge status: Home / Self Care | Payer: Medicaid Other | Source: Ambulatory Visit | Attending: Obstetrics & Gynecology | Admitting: Obstetrics & Gynecology

## 2019-04-23 ENCOUNTER — Other Ambulatory Visit: Payer: Self-pay

## 2019-04-23 ENCOUNTER — Encounter: Payer: Self-pay | Admitting: *Deleted

## 2019-04-23 ENCOUNTER — Encounter: Payer: Self-pay | Admitting: Obstetrics & Gynecology

## 2019-04-23 ENCOUNTER — Other Ambulatory Visit: Payer: Medicaid Other

## 2019-04-23 ENCOUNTER — Ambulatory Visit (INDEPENDENT_AMBULATORY_CARE_PROVIDER_SITE_OTHER): Payer: Medicaid Other | Admitting: Obstetrics & Gynecology

## 2019-04-23 VITALS — BP 130/83 | HR 90 | Wt 259.0 lb

## 2019-04-23 DIAGNOSIS — O99210 Obesity complicating pregnancy, unspecified trimester: Secondary | ICD-10-CM | POA: Insufficient documentation

## 2019-04-23 DIAGNOSIS — O0990 Supervision of high risk pregnancy, unspecified, unspecified trimester: Secondary | ICD-10-CM | POA: Diagnosis not present

## 2019-04-23 DIAGNOSIS — Z3A08 8 weeks gestation of pregnancy: Secondary | ICD-10-CM

## 2019-04-23 DIAGNOSIS — O0991 Supervision of high risk pregnancy, unspecified, first trimester: Secondary | ICD-10-CM | POA: Diagnosis not present

## 2019-04-23 DIAGNOSIS — Z6838 Body mass index (BMI) 38.0-38.9, adult: Secondary | ICD-10-CM | POA: Insufficient documentation

## 2019-04-23 MED ORDER — ASPIRIN 81 MG PO CHEW: 81.0000 mg | CHEWABLE_TABLET | Freq: Every day | ORAL | 3 refills | Status: DC

## 2019-04-23 NOTE — Addendum Note (Signed)
Addended by: Lyndal Rainbow on: 04/23/2019 04:42 PM   Modules accepted: Orders

## 2019-04-23 NOTE — Progress Notes (Signed)
  Subjective:    Maria Payne is a married G1  being seen today for her first obstetrical visit.  This is a planned pregnancy. She is at [redacted]w[redacted]d gestation. Her obstetrical history is significant for obesity and vertriculoperitoneal shunt. Relationship with FOB: spouse, living together. Patient does intend to breast feed. Pregnancy history fully reviewed.  Patient reports nausea and diarrhea.  Review of Systems:   Review of Systems  She sees her [facility] neurosurgeon regularly, may need shunt surgery in the near future.  Objective:     BP 130/83   Pulse 90   Wt 259 lb (117.5 kg)   LMP 02/23/2019   BMI 39.38 kg/m  Physical Exam  Exam Breathing, conversing, and ambulating normally Well nourished, well hydrated White female, no apparent distress Heart- rrr Lungs- CTAB Abd- benign, obese   Assessment:    Pregnancy: G1P0000 Patient Active Problem List   Diagnosis Date Noted  . Supervision of high risk pregnancy, antepartum 04/23/2019  . Obesity in pregnancy 04/23/2019  . Shunt malfunction 04/20/2017  . 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 Pain of upper abdomen 11/21/2015  . Psychic factors associated with diseases classified elsewhere 12/31/2013  . MDD (major depressive disorder) 04/04/2012  . GAD (generalized anxiety disorder) 04/04/2012  . 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 IIH (idiopathic intracranial hypertension) 09/10/2011  . Papilledema associated with increased intracranial pressure 08/20/2011  . DERMATITIS, PERIORAL 05/17/2010       Plan:     Initial labs drawn. Prenatal vitamins. Problem list reviewed and updated. Role of ultrasound in pregnancy discussed; fetal survey: ordered. Amniocentesis discussed: not indicated. Follow up in 4 weeks in person for NIPS BP cuff ordered Rec baby asa starting at 12 weeks. She has taken this in the past per recommendations of her neurosurgeon. Check HBA1C, rec about 15 pound weight gain She declines  nutrition consult as of now, will get one if + diabetes screen Check cmeta and pr/cr Baby scripts and MyChart apps    Emily Filbert 04/23/2019

## 2019-04-23 NOTE — Progress Notes (Signed)
Bedside U/S shows single IUP with FHT of 183 BPM and CRL measures 20.95 mm  GA is [redacted]w[redacted]d DATING AND VIABILITY SONOGRAM   MARDEL GRUDZIEN is a 28 y.o. year old G1P0000 with LMP Patient's last menstrual period was 02/23/2019. which would correlate to  [redacted]w[redacted]d weeks gestation.  She has regular menstrual cycles.   She is here today for a confirmatory initial sonogram.    GESTATION: [redacted]w[redacted]d SINGLETON yes    FETAL ACTIVITY:          Heart rate         183          The fetus is active.  PLACENTA LOCALIZATION:     CERVIX:   ADNEXA: The ovaries are Corpus Lutem on Rt ovary but Left is not visualized   GESTATIONAL AGE AND  BIOMETRICS:  Gestational criteria: Estimated Date of Delivery: 11/30/19 by LMP now at [redacted]w[redacted]d  Previous Scans:0          CROWN RUMP LENGTH           20.95 mm         [redacted]w[redacted]d weeks                                                                               AVERAGE EGA(BY THIS SCAN):  [redacted]w[redacted]d weeks  WORKING EDD( LMP ):  05/01/20     TECHNICIAN COMMENTS:  Single IUP with FHT of 183.  RT CL noted   A copy of this report including all images has been saved and backed up to a second source for retrieval if needed. All measures and details of the anatomical scan, placentation, fluid volume and pelvic anatomy are contained in that report.  Asencion Islam 04/23/2019 2:45 PM

## 2019-04-23 NOTE — Progress Notes (Signed)
Pt has shunt and been having H/A's again

## 2019-04-24 LAB — OBSTETRIC PANEL
Absolute Monocytes: 450 cells/uL (ref 200–950)
Antibody Screen: NOT DETECTED
Basophils Absolute: 24 cells/uL (ref 0–200)
Basophils Relative: 0.3 %
Eosinophils Absolute: 63 cells/uL (ref 15–500)
Eosinophils Relative: 0.8 %
HCT: 37.3 % (ref 35.0–45.0)
Hemoglobin: 12.3 g/dL (ref 11.7–15.5)
Hepatitis B Surface Ag: NONREACTIVE
Lymphs Abs: 1580 cells/uL (ref 850–3900)
MCH: 28.3 pg (ref 27.0–33.0)
MCHC: 33 g/dL (ref 32.0–36.0)
MCV: 85.9 fL (ref 80.0–100.0)
MPV: 9.5 fL (ref 7.5–12.5)
Monocytes Relative: 5.7 %
Neutro Abs: 5783 cells/uL (ref 1500–7800)
Neutrophils Relative %: 73.2 %
Platelets: 297 10*3/uL (ref 140–400)
RBC: 4.34 10*6/uL (ref 3.80–5.10)
RDW: 13.2 % (ref 11.0–15.0)
RPR Ser Ql: NONREACTIVE
Rubella: 6.61 index
Total Lymphocyte: 20 %
WBC: 7.9 10*3/uL (ref 3.8–10.8)

## 2019-04-24 LAB — COMPREHENSIVE METABOLIC PANEL
AG Ratio: 1.7 (calc) (ref 1.0–2.5)
ALT: 8 U/L (ref 6–29)
AST: 12 U/L (ref 10–30)
Albumin: 4.1 g/dL (ref 3.6–5.1)
Alkaline phosphatase (APISO): 36 U/L (ref 31–125)
BUN: 11 mg/dL (ref 7–25)
CO2: 25 mmol/L (ref 20–32)
Calcium: 9 mg/dL (ref 8.6–10.2)
Chloride: 103 mmol/L (ref 98–110)
Creat: 0.6 mg/dL (ref 0.50–1.10)
Globulin: 2.4 g/dL (calc) (ref 1.9–3.7)
Glucose, Bld: 76 mg/dL (ref 65–99)
Potassium: 3.9 mmol/L (ref 3.5–5.3)
Sodium: 136 mmol/L (ref 135–146)
Total Bilirubin: 0.3 mg/dL (ref 0.2–1.2)
Total Protein: 6.5 g/dL (ref 6.1–8.1)

## 2019-04-24 LAB — HEMOGLOBIN A1C
Hgb A1c MFr Bld: 5.3 % of total Hgb (ref <5.7)
Mean Plasma Glucose: 105 (calc)
eAG (mmol/L): 5.8 (calc)

## 2019-04-24 LAB — PROTEIN / CREATININE RATIO, URINE
Creatinine, Urine: 158 mg/dL (ref 20–275)
Protein/Creat Ratio: 70 mg/g creat (ref 21–161)
Protein/Creatinine Ratio: 0.07 mg/mg creat (ref 0.021–0.16)
Total Protein, Urine: 11 mg/dL (ref 5–24)

## 2019-04-24 LAB — HIV ANTIBODY (ROUTINE TESTING W REFLEX): HIV 1&2 Ab, 4th Generation: NONREACTIVE

## 2019-04-25 LAB — GC/CHLAMYDIA PROBE AMP (~~LOC~~) NOT AT ARMC
Chlamydia: NEGATIVE
Neisseria Gonorrhea: NEGATIVE

## 2019-04-26 LAB — CULTURE, OB URINE

## 2019-04-26 LAB — URINE CULTURE, OB REFLEX

## 2019-04-29 DIAGNOSIS — R109 Unspecified abdominal pain: Secondary | ICD-10-CM | POA: Diagnosis not present

## 2019-04-29 DIAGNOSIS — G932 Benign intracranial hypertension: Secondary | ICD-10-CM | POA: Diagnosis not present

## 2019-04-29 DIAGNOSIS — K863 Pseudocyst of pancreas: Secondary | ICD-10-CM | POA: Diagnosis not present

## 2019-05-05 ENCOUNTER — Other Ambulatory Visit: Payer: No Typology Code available for payment source

## 2019-05-05 ENCOUNTER — Encounter: Payer: No Typology Code available for payment source | Admitting: Certified Nurse Midwife

## 2019-05-13 ENCOUNTER — Encounter (HOSPITAL_COMMUNITY): Payer: Self-pay | Admitting: *Deleted

## 2019-05-13 ENCOUNTER — Inpatient Hospital Stay (HOSPITAL_COMMUNITY)
Admission: AD | Admit: 2019-05-13 | Discharge: 2019-05-13 | Disposition: A | Discharge status: Home / Self Care | Payer: Medicaid Other | Attending: Family Medicine | Admitting: Family Medicine

## 2019-05-13 ENCOUNTER — Telehealth: Payer: Self-pay | Admitting: *Deleted

## 2019-05-13 ENCOUNTER — Other Ambulatory Visit: Payer: Self-pay

## 2019-05-13 DIAGNOSIS — Z982 Presence of cerebrospinal fluid drainage device: Secondary | ICD-10-CM | POA: Insufficient documentation

## 2019-05-13 DIAGNOSIS — R197 Diarrhea, unspecified: Secondary | ICD-10-CM | POA: Diagnosis not present

## 2019-05-13 DIAGNOSIS — M549 Dorsalgia, unspecified: Secondary | ICD-10-CM | POA: Diagnosis not present

## 2019-05-13 DIAGNOSIS — O26891 Other specified pregnancy related conditions, first trimester: Secondary | ICD-10-CM

## 2019-05-13 DIAGNOSIS — R195 Other fecal abnormalities: Secondary | ICD-10-CM | POA: Diagnosis not present

## 2019-05-13 DIAGNOSIS — Z3A11 11 weeks gestation of pregnancy: Secondary | ICD-10-CM

## 2019-05-13 DIAGNOSIS — O99891 Other specified diseases and conditions complicating pregnancy: Secondary | ICD-10-CM

## 2019-05-13 DIAGNOSIS — Z7982 Long term (current) use of aspirin: Secondary | ICD-10-CM | POA: Insufficient documentation

## 2019-05-13 DIAGNOSIS — O99841 Bariatric surgery status complicating pregnancy, first trimester: Secondary | ICD-10-CM | POA: Diagnosis not present

## 2019-05-13 DIAGNOSIS — O9989 Other specified diseases and conditions complicating pregnancy, childbirth and the puerperium: Secondary | ICD-10-CM

## 2019-05-13 DIAGNOSIS — M545 Low back pain: Secondary | ICD-10-CM | POA: Insufficient documentation

## 2019-05-13 DIAGNOSIS — R030 Elevated blood-pressure reading, without diagnosis of hypertension: Secondary | ICD-10-CM | POA: Diagnosis present

## 2019-05-13 HISTORY — DX: Unspecified asthma, uncomplicated: J45.909

## 2019-05-13 HISTORY — DX: Depression, unspecified: F32.A

## 2019-05-13 LAB — CBC WITH DIFFERENTIAL/PLATELET
Abs Immature Granulocytes: 0.04 10*3/uL (ref 0.00–0.07)
Basophils Absolute: 0 10*3/uL (ref 0.0–0.1)
Basophils Relative: 0 %
Eosinophils Absolute: 0.1 10*3/uL (ref 0.0–0.5)
Eosinophils Relative: 1 %
HCT: 38.2 % (ref 36.0–46.0)
Hemoglobin: 12.6 g/dL (ref 12.0–15.0)
Immature Granulocytes: 1 %
Lymphocytes Relative: 17 %
Lymphs Abs: 1.3 10*3/uL (ref 0.7–4.0)
MCH: 28.8 pg (ref 26.0–34.0)
MCHC: 33 g/dL (ref 30.0–36.0)
MCV: 87.4 fL (ref 80.0–100.0)
Monocytes Absolute: 0.4 10*3/uL (ref 0.1–1.0)
Monocytes Relative: 5 %
Neutro Abs: 6 10*3/uL (ref 1.7–7.7)
Neutrophils Relative %: 76 %
Platelets: 260 10*3/uL (ref 150–400)
RBC: 4.37 MIL/uL (ref 3.87–5.11)
RDW: 12.7 % (ref 11.5–15.5)
WBC: 7.9 10*3/uL (ref 4.0–10.5)
nRBC: 0 % (ref 0.0–0.2)

## 2019-05-13 LAB — URINALYSIS, ROUTINE W REFLEX MICROSCOPIC
Bilirubin Urine: NEGATIVE
Glucose, UA: NEGATIVE mg/dL
Hgb urine dipstick: NEGATIVE
Ketones, ur: 5 mg/dL — AB
Nitrite: NEGATIVE
Protein, ur: NEGATIVE mg/dL
Specific Gravity, Urine: 1.012 (ref 1.005–1.030)
pH: 6 (ref 5.0–8.0)

## 2019-05-13 LAB — COMPREHENSIVE METABOLIC PANEL
ALT: 12 U/L (ref 0–44)
AST: 16 U/L (ref 15–41)
Albumin: 3.5 g/dL (ref 3.5–5.0)
Alkaline Phosphatase: 30 U/L — ABNORMAL LOW (ref 38–126)
Anion gap: 11 (ref 5–15)
BUN: 9 mg/dL (ref 6–20)
CO2: 22 mmol/L (ref 22–32)
Calcium: 9.1 mg/dL (ref 8.9–10.3)
Chloride: 103 mmol/L (ref 98–111)
Creatinine, Ser: 0.59 mg/dL (ref 0.44–1.00)
GFR calc Af Amer: >60 mL/min (ref >60)
GFR calc non Af Amer: >60 mL/min (ref >60)
Glucose, Bld: 79 mg/dL (ref 70–99)
Potassium: 3.9 mmol/L (ref 3.5–5.1)
Sodium: 136 mmol/L (ref 135–145)
Total Bilirubin: 0.3 mg/dL (ref 0.3–1.2)
Total Protein: 6.7 g/dL (ref 6.5–8.1)

## 2019-05-13 MED ORDER — LACTATED RINGERS IV BOLUS
1000.0000 mL | Freq: Once | INTRAVENOUS | Status: AC
  Administered 2019-05-13: 1000 mL via INTRAVENOUS

## 2019-05-13 NOTE — Telephone Encounter (Signed)
Patient is not felling well since yesterday morning, to the point where she feels as if she is going to pass out. Patient advised to go to MAU for observation because the [place] office does not have a provider until 05/14/2019 in the afternoon.

## 2019-05-13 NOTE — MAU Provider Note (Signed)
History     CSN: [id number]  Arrival date and time: 05/13/19 1512   First Provider Initiated Contact with Patient 05/13/19 1650      Chief Complaint  Patient presents with  . Back Pain  . Fatigue   HPI Ms. Maria Payne is a 28 y.o. G1P0000 at [redacted]w[redacted]d who presents to MAU today with complaint of fatigue, feeling unwell, elevated BP and loose stools x 2 days. The patient denies N/V, but has had watery stools 5-7 times/day for the last 2 days. She has a history of gastric sleeve surgery, but has not had symptoms like this in a while. She also endorses a lack of appetite and decreased PO intake. She feels weak and tired. She has not tried any medication for her symptoms except Tylenol for headache. She has a PMH significant for pseudotumor cerebri and has a shunt. She last took Tylenol at 930 today without relief. She denies vaginal bleeding, abnormal discharge or fever. She has had chills and sweats. She feels low back pressure and denies abdominal pain.   OB History    Gravida  1   Para  0   Term  0   Preterm  0   AB  0   Living  0     SAB  0   TAB  0   Ectopic  0   Multiple  0   Live Births  0           Past Medical History:  Diagnosis Date  . Asthma   . 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 Had diagnostic cerebral arteriogram.     . Depression   . GAD (generalized anxiety disorder) 04/04/2012  . Papilledema    Diamox  . Pseudotumor cerebri     Past Surgical History:  Procedure Laterality Date  . ANGIOPLASTY  2012   ? cerebral artery   . CHOLECYSTECTOMY    . SHUNT REVISION     three times  . SLEEVE GASTROPLASTY  05/2014   Dr. Evorn Gong   . Stent placed  August 2014   Stenosis right transverse sigmoid junction,[facility]  . VP shunt  11/2013   Done at [facility]    Family History  Problem Relation Age of Onset  . Hypertension Father   . Heart disease Father   . Diabetes Father   . Diabetes Mother     Social History    Tobacco Use  . Smoking status: Never Smoker  . Smokeless tobacco: Never Used  Substance Use Topics  . Alcohol use: Not Currently    Alcohol/week: 0.0 standard drinks    Comment: occ  . Drug use: No    Allergies:  Allergies  Allergen Reactions  . Latex     Other reaction(s): Other Diagnosed with blood test  . Nsaids     Other reaction(s): Other contraindication  . Codeine   . Hydrocodone-Acetaminophen Rash    Headaches Morphine, Dilaudid OK  . Oxycodone Rash    Headaches  . Tape Rash    welps    Medications Prior to Admission  Medication Sig Dispense Refill Last Dose  . acetaminophen (TYLENOL) 500 MG tablet Take 1,000 mg by mouth 2 (two) times daily.   05/13/2019 at 0930  . aspirin 81 MG chewable tablet Chew 1 tablet (81 mg total) by mouth daily. 90 tablet 3 05/12/2019 at 1000  . Prenatal Vit-Fe Fumarate-FA (MULTIVITAMIN-PRENATAL) 27-0.8 MG TABS tablet Take 1 tablet by mouth daily  at 12 noon.   05/12/2019 at 1000    Review of Systems  Constitutional: Positive for chills and fatigue. Negative for fever.  Gastrointestinal: Positive for diarrhea and nausea. Negative for abdominal pain, blood in stool, constipation and vomiting.  Genitourinary: Negative for dysuria, flank pain, frequency, pelvic pain, vaginal bleeding and vaginal discharge.  Musculoskeletal: Positive for back pain.  Neurological: Positive for dizziness and light-headedness. Negative for syncope.   Physical Exam   Blood pressure 131/77, pulse (!) 107, temperature 99 F (37.2 C), temperature source Oral, resp. rate 18, height 5\' 8"  (1.727 m), weight 116.1 kg, last menstrual period 02/23/2019, SpO2 98 %.  Physical Exam  Nursing note and vitals reviewed. Constitutional: She is oriented to person, place, and time. She appears well-developed and well-nourished. No distress.  HENT:  Head: Normocephalic and atraumatic.  Cardiovascular: Tachycardia present.  Respiratory: Effort normal.  GI: Soft. She exhibits  no distension and no mass. There is no abdominal tenderness. There is no rebound and no guarding.  Neurological: She is alert and oriented to person, place, and time.  Skin: Skin is warm and dry. No erythema.  Psychiatric: She has a normal mood and affect.   Results for orders placed or performed during the hospital encounter of 05/13/19 (from the past 24 hour(s))  Urinalysis, Routine w reflex microscopic     Status: Abnormal   Collection Time: 05/13/19  4:33 PM  Result Value Ref Range   Color, Urine YELLOW YELLOW   APPearance HAZY (A) CLEAR   Specific Gravity, Urine 1.012 1.005 - 1.030   pH 6.0 5.0 - 8.0   Glucose, UA NEGATIVE NEGATIVE mg/dL   Hgb urine dipstick NEGATIVE NEGATIVE   Bilirubin Urine NEGATIVE NEGATIVE   Ketones, ur 5 (A) NEGATIVE mg/dL   Protein, ur NEGATIVE NEGATIVE mg/dL   Nitrite NEGATIVE NEGATIVE   Leukocytes,Ua TRACE (A) NEGATIVE   RBC / HPF 0-5 0 - 5 RBC/hpf   WBC, UA 0-5 0 - 5 WBC/hpf   Bacteria, UA MANY (A) NONE SEEN   Squamous Epithelial / LPF 0-5 0 - 5   Mucus PRESENT   CBC with Differential/Platelet     Status: None   Collection Time: 05/13/19  5:40 PM  Result Value Ref Range   WBC 7.9 4.0 - 10.5 K/uL   RBC 4.37 3.87 - 5.11 MIL/uL   Hemoglobin 12.6 12.0 - 15.0 g/dL   HCT 38.2 36.0 - 46.0 %   MCV 87.4 80.0 - 100.0 fL   MCH 28.8 26.0 - 34.0 pg   MCHC 33.0 30.0 - 36.0 g/dL   RDW 12.7 11.5 - 15.5 %   Platelets 260 150 - 400 K/uL   nRBC 0.0 0.0 - 0.2 %   Neutrophils Relative % 76 %   Neutro Abs 6.0 1.7 - 7.7 K/uL   Lymphocytes Relative 17 %   Lymphs Abs 1.3 0.7 - 4.0 K/uL   Monocytes Relative 5 %   Monocytes Absolute 0.4 0.1 - 1.0 K/uL   Eosinophils Relative 1 %   Eosinophils Absolute 0.1 0.0 - 0.5 K/uL   Basophils Relative 0 %   Basophils Absolute 0.0 0.0 - 0.1 K/uL   Immature Granulocytes 1 %   Abs Immature Granulocytes 0.04 0.00 - 0.07 K/uL  Comprehensive metabolic panel     Status: Abnormal   Collection Time: 05/13/19  5:40 PM  Result Value  Ref Range   Sodium 136 135 - 145 mmol/L   Potassium 3.9 3.5 - 5.1 mmol/L  Chloride 103 98 - 111 mmol/L   CO2 22 22 - 32 mmol/L   Glucose, Bld 79 70 - 99 mg/dL   BUN 9 6 - 20 mg/dL   Creatinine, Ser 0.59 0.44 - 1.00 mg/dL   Calcium 9.1 8.9 - 10.3 mg/dL   Total Protein 6.7 6.5 - 8.1 g/dL   Albumin 3.5 3.5 - 5.0 g/dL   AST 16 15 - 41 U/L   ALT 12 0 - 44 U/L   Alkaline Phosphatase 30 (L) 38 - 126 U/L   Total Bilirubin 0.3 0.3 - 1.2 mg/dL   GFR calc non Af Amer >60 >60 mL/min   GFR calc Af Amer >60 >60 mL/min   Anion gap 11 5 - 15    MAU Course  Procedures Pt informed that the ultrasound is considered a limited OB ultrasound and is not intended to be a complete ultrasound exam.  Patient also informed that the ultrasound is not being completed with the intent of assessing for fetal or placental anomalies or any pelvic abnormalities.  Explained that the purpose of today's ultrasound is to assess for  viability.  Patient acknowledges the purpose of the exam and the limitations of the study.  FHR 167 bpm noted. +FM.   MDM FHR - 167 bpm with beside [place], unable to obtain FHR with doppler even when audible FHTs noted UA today shows mild dehydration  CBC, CMP without gross abnormalities  IV LR bolus given x 2   Assessment and Plan  A: SIUP at [redacted]w[redacted]d Low back pain in pregnancy, first trimester  Diarrhea   P:  Discharge home Advised to take Immodium for loose stools  Warning signs for worsening condition discussed. Patient advised to return if symptoms worsen or do not improve within the next weeks.  Discussed BRAT diet  Patient advised to follow-up with [facility] as scheduled for routine prenatal care  Patient may return to MAU as needed or if her condition were to change or worsen  Kerry Hough, PA-C 05/13/2019, 7:47 PM

## 2019-05-13 NOTE — Discharge Instructions (Signed)
Food Choices to Help Relieve Diarrhea, Adult When you have diarrhea, the foods you eat and your eating habits are very important. Choosing the right foods and drinks can help:  Relieve diarrhea.  Replace lost fluids and nutrients.  Prevent dehydration. What general guidelines should I follow?  Relieving diarrhea  Choose foods with less than 2 g or .07 oz. of fiber per serving.  Limit fats to less than 8 tsp (38 g or 1.34 oz.) a day.  Avoid the following: ? Foods and beverages sweetened with high-fructose corn syrup, honey, or sugar alcohols such as xylitol, sorbitol, and mannitol. ? Foods that contain a lot of fat or sugar. ? Fried, greasy, or spicy foods. ? High-fiber grains, breads, and cereals. ? Raw fruits and vegetables.  Eat foods that are rich in probiotics. These foods include dairy products such as yogurt and fermented milk products. They help increase healthy bacteria in the stomach and intestines (gastrointestinal tract, or GI tract).  If you have lactose intolerance, avoid dairy products. These may make your diarrhea worse.  Take medicine to help stop diarrhea (antidiarrheal medicine) only as told by your health care provider. Replacing nutrients  Eat small meals or snacks every 3-4 hours.  Eat bland foods, such as white rice, toast, or baked potato, until your diarrhea starts to get better. Gradually reintroduce nutrient-rich foods as tolerated or as told by your health care provider. This includes: ? Well-cooked protein foods. ? Peeled, seeded, and soft-cooked fruits and vegetables. ? Low-fat dairy products.  Take vitamin and mineral supplements as told by your health care provider. Preventing dehydration  Start by sipping water or a special solution to prevent dehydration (oral rehydration solution, ORS). Urine that is clear or pale yellow means that you are getting enough fluid.  Try to drink at least 8-10 cups of fluid each day to help replace lost  fluids.  You may add other liquids in addition to water, such as clear juice or decaffeinated sports drinks, as tolerated or as told by your health care provider.  Avoid drinks with caffeine, such as coffee, tea, or soft drinks.  Avoid alcohol. What foods are recommended?     The items listed may not be a complete list. Talk with your health care provider about what dietary choices are best for you. Grains White rice. White, [place], or pita breads (fresh or toasted), including plain rolls, buns, or bagels. White pasta. Saltine, soda, or graham crackers. Pretzels. Low-fiber cereal. Cooked cereals made with water (such as cornmeal, farina, or cream cereals). Plain muffins. Matzo. Melba toast. Zwieback. Vegetables Potatoes (without the skin). Most well-cooked and canned vegetables without skins or seeds. Tender lettuce. Fruits Apple sauce. Fruits canned in juice. Cooked apricots, cherries, grapefruit, peaches, pears, or plums. Fresh bananas and cantaloupe. Meats and other protein foods Baked or boiled chicken. Eggs. Tofu. Fish. Seafood. Smooth nut butters. Ground or well-cooked tender beef, ham, veal, lamb, pork, or poultry. Dairy Plain yogurt, kefir, and unsweetened liquid yogurt. Lactose-free milk, buttermilk, skim milk, or soy milk. Low-fat or nonfat hard cheese. Beverages Water. Low-calorie sports drinks. Fruit juices without pulp. Strained tomato and vegetable juices. Decaffeinated teas. Sugar-free beverages not sweetened with sugar alcohols. Oral rehydration solutions, if approved by your health care provider. Seasoning and other foods Bouillon, broth, or soups made from recommended foods. What foods are not recommended? The items listed may not be a complete list. Talk with your health care provider about what dietary choices are best for you. Grains Whole  grain, whole wheat, bran, or rye breads, rolls, pastas, and crackers. Wild or brown rice. Whole grain or bran cereals. Barley.  Oats and oatmeal. Corn tortillas or taco shells. Granola. Popcorn. Vegetables Raw vegetables. Fried vegetables. Cabbage, broccoli, Brussels sprouts, artichokes, baked beans, beet greens, corn, kale, legumes, peas, sweet potatoes, and yams. Potato skins. Cooked spinach and cabbage. Fruits Dried fruit, including raisins and dates. Raw fruits. Stewed or dried prunes. Canned fruits with syrup. Meat and other protein foods Fried or fatty meats. Deli meats. Chunky nut butters. Nuts and seeds. Beans and lentils. Berniece Salines. Hot dogs. Sausage. Dairy High-fat cheeses. Whole milk, chocolate milk, and beverages made with milk, such as milk shakes. Half-and-half. Cream. sour cream. Ice cream. Beverages Caffeinated beverages (such as coffee, tea, soda, or energy drinks). Alcoholic beverages. Fruit juices with pulp. Prune juice. Soft drinks sweetened with high-fructose corn syrup or sugar alcohols. High-calorie sports drinks. Fats and oils Butter. Cream sauces. Margarine. Salad oils. Plain salad dressings. Olives. Avocados. Mayonnaise. Sweets and desserts Sweet rolls, doughnuts, and sweet breads. Sugar-free desserts sweetened with sugar alcohols such as xylitol and sorbitol. Seasoning and other foods Honey. Hot sauce. Chili powder. Gravy. Cream-based or milk-based soups. Pancakes and waffles. Summary  When you have diarrhea, the foods you eat and your eating habits are very important.  Make sure you get at least 8-10 cups of fluid each day, or enough to keep your urine clear or pale yellow.  Eat bland foods and gradually reintroduce healthy, nutrient-rich foods as tolerated, or as told by your health care provider.  Avoid high-fiber, fried, greasy, or spicy foods. This information is not intended to replace advice given to you by your health care provider. Make sure you discuss any questions you have with your health care provider. Document Released: 11/17/2003 Document Revised: 12/18/2018 Document Reviewed:  08/24/2016 Elsevier Patient Education  2020 [place] Diet A bland diet consists of foods that are often soft and do not have a lot of fat, fiber, or extra seasonings. Foods without fat, fiber, or seasoning are easier for the body to digest. They are also less likely to irritate your mouth, throat, stomach, and other parts of your digestive system. A bland diet is sometimes called a BRAT diet. What is my plan? Your health care provider or food and nutrition specialist (dietitian) may recommend specific changes to your diet to prevent symptoms or to treat your symptoms. These changes may include:  Eating small meals often.  Cooking food until it is soft enough to chew easily.  Chewing your food well.  Drinking fluids slowly.  Not eating foods that are very spicy, sour, or fatty.  Not eating citrus fruits, such as oranges and grapefruit. What do I need to know about this diet?  Eat a variety of foods from the bland diet food list.  Do not follow a bland diet longer than needed.  Ask your health care provider whether you should take vitamins or supplements. What foods can I eat? Grains  Hot cereals, such as cream of wheat. Rice. Bread, crackers, or tortillas made from refined white flour. Vegetables Canned or cooked vegetables. Mashed or boiled potatoes. Fruits  Bananas. Applesauce. Other types of cooked or canned fruit with the skin and seeds removed, such as canned peaches or pears. Meats and other proteins  Scrambled eggs. Creamy peanut butter or other nut butters. Lean, well-cooked meats, such as chicken or fish. Tofu. Soups or broths. Dairy Low-fat dairy products, such as milk, cottage cheese, or  yogurt. Beverages  Water. Herbal tea. Apple juice. Fats and oils Mild salad dressings. Canola or olive oil. Sweets and desserts Pudding. Custard. Fruit gelatin. Ice cream. The items listed above may not be a complete list of recommended foods and beverages. Contact  a dietitian for more options. What foods are not recommended? Grains Whole grain breads and cereals. Vegetables Raw vegetables. Fruits Raw fruits, especially citrus, berries, or dried fruits. Dairy Whole fat dairy foods. Beverages Caffeinated drinks. Alcohol. Seasonings and condiments Strongly flavored seasonings or condiments. Hot sauce. Salsa. Other foods Spicy foods. Fried foods. Sour foods, such as pickled or fermented foods. Foods with high sugar content. Foods high in fiber. The items listed above may not be a complete list of foods and beverages to avoid. Contact a dietitian for more information. Summary  A bland diet consists of foods that are often soft and do not have a lot of fat, fiber, or extra seasonings.  Foods without fat, fiber, or seasoning are easier for the body to digest.  Check with your health care provider to see how long you should follow this diet plan. It is not meant to be followed for long periods. This information is not intended to replace advice given to you by your health care provider. Make sure you discuss any questions you have with your health care provider. Document Released: 12/19/2015 Document Revised: 09/25/2017 Document Reviewed: 09/25/2017 Elsevier Patient Education  2020 [employer].

## 2019-05-13 NOTE — MAU Note (Signed)
Presents with c/o lower back pain that began yesterday.  Denies dysuria today, but present yesterday.  States having pelvic pain as well.  Took BP @ home and BP 152/98.  Denies VB.

## 2019-05-20 ENCOUNTER — Other Ambulatory Visit: Payer: Self-pay | Admitting: Obstetrics & Gynecology

## 2019-05-20 MED ORDER — ESCITALOPRAM OXALATE 20 MG PO TABS: 20.0000 mg | ORAL_TABLET | Freq: Every day | ORAL | 6 refills | Status: DC

## 2019-05-20 NOTE — Progress Notes (Signed)
lexapro prescribed per patient request.

## 2019-05-21 ENCOUNTER — Encounter: Payer: Self-pay | Admitting: *Deleted

## 2019-05-21 ENCOUNTER — Other Ambulatory Visit: Payer: Self-pay

## 2019-05-21 ENCOUNTER — Ambulatory Visit (INDEPENDENT_AMBULATORY_CARE_PROVIDER_SITE_OTHER): Payer: Medicaid Other | Admitting: Obstetrics & Gynecology

## 2019-05-21 VITALS — BP 132/82 | HR 104 | Wt 256.0 lb

## 2019-05-21 DIAGNOSIS — Z23 Encounter for immunization: Secondary | ICD-10-CM | POA: Diagnosis not present

## 2019-05-21 DIAGNOSIS — O99211 Obesity complicating pregnancy, first trimester: Secondary | ICD-10-CM | POA: Diagnosis not present

## 2019-05-21 DIAGNOSIS — O0990 Supervision of high risk pregnancy, unspecified, unspecified trimester: Secondary | ICD-10-CM

## 2019-05-21 DIAGNOSIS — Z3482 Encounter for supervision of other normal pregnancy, second trimester: Secondary | ICD-10-CM | POA: Diagnosis not present

## 2019-05-21 DIAGNOSIS — O99210 Obesity complicating pregnancy, unspecified trimester: Secondary | ICD-10-CM

## 2019-05-21 DIAGNOSIS — O0991 Supervision of high risk pregnancy, unspecified, first trimester: Secondary | ICD-10-CM | POA: Diagnosis not present

## 2019-05-21 DIAGNOSIS — Z3A12 12 weeks gestation of pregnancy: Secondary | ICD-10-CM

## 2019-05-21 NOTE — Progress Notes (Signed)
   PRENATAL VISIT NOTE  Subjective:  Maria Payne is a 28 y.o. G1P0000 at [redacted]w[redacted]d being seen today for ongoing prenatal care.  She is currently monitored for the following issues for this high-risk pregnancy and has DERMATITIS, PERIORAL; Cerebral venous sinus thrombosis; IIH (idiopathic intracranial hypertension); MDD (major depressive disorder); GAD (generalized anxiety disorder); S/P ventriculoperitoneal shunt; Shunt malfunction; Papilledema associated with increased intracranial pressure; Pain of upper abdomen; Psychic factors associated with diseases classified elsewhere; Supervision of high risk pregnancy, antepartum; and Obesity in pregnancy on their problem list.  Patient reports no complaints. She reports that the nausea, vomitting, and diarrhea are improving. She restarted lexapro due to depression return of symptoms, but denies SI, HI. She already sees a counselo  . Vag. Bleeding: None.  Movement: Absent. Denies leaking of fluid.   The following portions of the patient's history were reviewed and updated as appropriate: allergies, current medications, past family history, past medical history, past social history, past surgical history and problem list.   Objective:   Vitals:   05/21/19 1539  BP: 132/82  Pulse: (!) 104  Weight: 256 lb (116.1 kg)    Fetal Status: Fetal Heart Rate (bpm): 166   Movement: Absent     General:  Alert, oriented and cooperative. Patient is in no acute distress.  Skin: Skin is warm and dry. No rash noted.   Cardiovascular: Normal heart rate noted  Respiratory: Normal respiratory effort, no problems with respiration noted  Abdomen: Soft, gravid, appropriate for gestational age.  Pain/Pressure: Absent     Pelvic: Cervical exam deferred        Extremities: Normal range of motion.  Edema: None  Mental Status: Normal mood and affect. Normal behavior. Normal judgment and thought content.   Assessment and Plan:  Pregnancy: G1P0000 at [redacted]w[redacted]d 1. Supervision of  high risk pregnancy, antepartum - flu vaccine today - Genetic Screening  2. Obesity in pregnancy - on baby asa daily  Preterm labor symptoms and general obstetric precautions including but not limited to vaginal bleeding, contractions, leaking of fluid and fetal movement were reviewed in detail with the patient. Please refer to After Visit Summary for other counseling recommendations.   No follow-ups on file.  Future Appointments  Date Time Provider [place]  07/13/2019 11:00 AM [place] [facility]  07/13/2019 11:00 AM [facility] [place] 3 [facility]    Emily Filbert, MD

## 2019-05-29 ENCOUNTER — Encounter: Payer: Self-pay | Admitting: *Deleted

## 2019-05-29 DIAGNOSIS — O26892 Other specified pregnancy related conditions, second trimester: Secondary | ICD-10-CM | POA: Diagnosis not present

## 2019-05-29 DIAGNOSIS — R51 Headache: Secondary | ICD-10-CM | POA: Diagnosis not present

## 2019-05-29 DIAGNOSIS — Z982 Presence of cerebrospinal fluid drainage device: Secondary | ICD-10-CM | POA: Diagnosis not present

## 2019-05-29 DIAGNOSIS — Z3A00 Weeks of gestation of pregnancy not specified: Secondary | ICD-10-CM | POA: Diagnosis not present

## 2019-06-01 DIAGNOSIS — O0990 Supervision of high risk pregnancy, unspecified, unspecified trimester: Secondary | ICD-10-CM

## 2019-06-03 ENCOUNTER — Encounter: Payer: Self-pay | Admitting: *Deleted

## 2019-06-03 DIAGNOSIS — O0990 Supervision of high risk pregnancy, unspecified, unspecified trimester: Secondary | ICD-10-CM

## 2019-06-12 ENCOUNTER — Ambulatory Visit (INDEPENDENT_AMBULATORY_CARE_PROVIDER_SITE_OTHER)
Admission: RE | Admit: 2019-06-12 | Discharge: 2019-06-12 | Disposition: A | Discharge status: Home / Self Care | Payer: Medicaid Other | Source: Ambulatory Visit

## 2019-06-12 ENCOUNTER — Encounter: Payer: Self-pay | Admitting: Family Medicine

## 2019-06-12 DIAGNOSIS — J3489 Other specified disorders of nose and nasal sinuses: Secondary | ICD-10-CM

## 2019-06-12 DIAGNOSIS — J019 Acute sinusitis, unspecified: Secondary | ICD-10-CM | POA: Diagnosis not present

## 2019-06-12 NOTE — ED Provider Notes (Signed)
Virtual Visit via Video Note:  Maria Payne  initiated request for Telemedicine visit with [facility] team. I connected with Maria Payne  on 06/12/2019 at 1:11 PM  for a synchronized telemedicine visit using a video enabled HIPPA compliant telemedicine application. I verified that I am speaking with Maria Payne  using two identifiers. Zigmund Gottron, NP  was physically located in a [facility] Urgent care site and Maria Payne was located at a different location.   The limitations of evaluation and management by telemedicine as well as the availability of in-person appointments were discussed. Patient was informed that she  may incur a bill ( including co-pay) for this virtual visit encounter. Maria Payne  expressed understanding and gave verbal consent to proceed with virtual visit.     History of Present Illness:Maria Payne  is a 28 y.o. female presents with complaints of facial pressure. Ear pain this morning. Post nasal drip. No sore throat. Symptoms started earlier this week, around 9/28. mucinex has not helped with symptoms. Pregnant. No fevers. No cough, no shortness of breath . Has had to use her inhaler occasionally for her asthma, a few nights ago she used it, it does help. History of allergies, not taking any allergies medications. Almost [redacted] weeks pregnant. No abdominal pain, no vaginal bleeding.   Past Medical History:  Diagnosis Date  . Asthma   . 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   Admitted to [facility] on 08/21/11. Had diagnostic cerebral arteriogram.     . Depression   . GAD (generalized anxiety disorder) 04/04/2012  . Papilledema    Diamox  . Pseudotumor cerebri     Allergies  Allergen Reactions  . Latex     Other reaction(s): Other Diagnosed with blood test  . Nsaids     Other reaction(s): Other contraindication  . Codeine   . Hydrocodone-Acetaminophen Rash    Headaches Morphine, Dilaudid OK  . Oxycodone Rash    Headaches  . Tape  Rash    welps        Observations/Objective: Alert, oriented, non toxic in appearance. Clear coherent speech without difficulty. No increased work of breathing visualized.    Assessment and Plan: Sinusitis, <10 days duration. Nasal spray, mucinex, compresses, antihistamines as needed discussed and recommended. If symptoms worsen or do not improve in the next week to be seen in person or to follow up with PCP.  Patient verbalized understanding and agreeable to plan.    Follow Up Instructions:    I discussed the assessment and treatment plan with the patient. The patient was provided an opportunity to ask questions and all were answered. The patient agreed with the plan and demonstrated an understanding of the instructions.   The patient was advised to call back or seek an in-person evaluation if the symptoms worsen or if the condition fails to improve as anticipated.  I provided 15 minutes of non-face-to-face time during this encounter.    Zigmund Gottron, NP  06/12/2019 1:11 PM         Zigmund Gottron, NP 06/12/19 1438

## 2019-06-12 NOTE — Discharge Instructions (Signed)
Nice to meet you!. Push fluids to ensure adequate hydration and keep secretions thin.  Tylenol as needed for pain.  I do recommend a daily nasal spray such as flonase or nasonex.  Warm compresses to the face or warm showers to help loosen secretions may also be helpful.  An antihistamine to help with your allergies may also be helpful.    Your inhaler as needed.  If symptoms worsen or do not improve in the next week to return to be seen or to follow up with your PCP.

## 2019-06-19 ENCOUNTER — Encounter: Payer: Self-pay | Admitting: Advanced Practice Midwife

## 2019-06-19 ENCOUNTER — Telehealth (INDEPENDENT_AMBULATORY_CARE_PROVIDER_SITE_OTHER): Payer: Medicaid Other | Admitting: Advanced Practice Midwife

## 2019-06-19 VITALS — BP 129/75

## 2019-06-19 DIAGNOSIS — O0990 Supervision of high risk pregnancy, unspecified, unspecified trimester: Secondary | ICD-10-CM

## 2019-06-19 DIAGNOSIS — Z982 Presence of cerebrospinal fluid drainage device: Secondary | ICD-10-CM

## 2019-06-19 DIAGNOSIS — O99212 Obesity complicating pregnancy, second trimester: Secondary | ICD-10-CM | POA: Diagnosis not present

## 2019-06-19 DIAGNOSIS — Z3A16 16 weeks gestation of pregnancy: Secondary | ICD-10-CM | POA: Diagnosis not present

## 2019-06-19 DIAGNOSIS — O99210 Obesity complicating pregnancy, unspecified trimester: Secondary | ICD-10-CM

## 2019-06-19 DIAGNOSIS — O0992 Supervision of high risk pregnancy, unspecified, second trimester: Secondary | ICD-10-CM | POA: Diagnosis not present

## 2019-06-19 NOTE — Progress Notes (Signed)
   TELEHEALTH VIRTUAL OBSTETRICS VISIT ENCOUNTER NOTE  I connected with Maria Payne on 06/19/19 at  9:15 AM EDT by telephone at home and verified that I am speaking with the correct person using two identifiers.   I discussed the limitations, risks, security and privacy concerns of performing an evaluation and management service by telephone and the availability of in person appointments. I also discussed with the patient that there may be a patient responsible charge related to this service. The patient expressed understanding and agreed to proceed.  Subjective:  Maria Payne is a 27 y.o. G1P0000 at [redacted]w[redacted]d being followed for ongoing prenatal care.  She is currently monitored for the following issues for this high-risk pregnancy and has DERMATITIS, PERIORAL; Cerebral venous sinus thrombosis; IIH (idiopathic intracranial hypertension); MDD (major depressive disorder); GAD (generalized anxiety disorder); S/P ventriculoperitoneal shunt; Shunt malfunction; Papilledema associated with increased intracranial pressure; Pain of upper abdomen; Psychic factors associated with diseases classified elsewhere; Supervision of high risk pregnancy, antepartum; and Obesity in pregnancy on their problem list.  Patient reports headache. Reports fetal movement. Denies any contractions, bleeding or leaking of fluid.   The following portions of the patient's history were reviewed and updated as appropriate: allergies, current medications, past family history, past medical history, past social history, past surgical history and problem list.   Objective:   General:  Alert, oriented and cooperative.   Mental Status: Normal mood and affect perceived. Normal judgment and thought content.  Rest of physical exam deferred due to type of encounter  Assessment and Plan:  Pregnancy: G1P0000 at [redacted]w[redacted]d 1. Supervision of high risk pregnancy, antepartum  2. Obesity in pregnancy  3. VP (ventriculoperitoneal) shunt status -  Ambulatory referral to Neurosurgery - Patient reports new onset headache x 3 days. She saw neurosurgery 2 days ago, and they adjusted her shunt. She is still having the headache. Tylenol is not helping. She has a call in to her neurosurgeon's office at this time. She is worried because she does not have insurance other than pregnancy medicaid. Referral to neurosurgery placed for patient. It is important for her to follow with them during the pregnancy to ensure safe care during pregnancy and for delivery planning.    Preterm labor symptoms and general obstetric precautions including but not limited to vaginal bleeding, contractions, leaking of fluid and fetal movement were reviewed in detail with the patient.  I discussed the assessment and treatment plan with the patient. The patient was provided an opportunity to ask questions and all were answered. The patient agreed with the plan and demonstrated an understanding of the instructions. The patient was advised to call back or seek an in-person office evaluation/go to MAU at [facility] for any urgent or concerning symptoms. Please refer to After Visit Summary for other counseling recommendations.   I provided 12 minutes of non-face-to-face time during this encounter.  Return in about 4 weeks (around 07/17/2019) for in person visit .  Future Appointments  Date Time Provider [place]  07/13/2019 11:00 AM [place] NURSE [facility]  07/13/2019 11:00 AM [place] 3 [facility] M[facility]-US    [place], CNM  06/19/19  9:31 AM  [facility] [facility]

## 2019-06-23 DIAGNOSIS — Z982 Presence of cerebrospinal fluid drainage device: Secondary | ICD-10-CM | POA: Diagnosis not present

## 2019-06-23 DIAGNOSIS — G932 Benign intracranial hypertension: Secondary | ICD-10-CM | POA: Diagnosis not present

## 2019-06-25 DIAGNOSIS — Z982 Presence of cerebrospinal fluid drainage device: Secondary | ICD-10-CM | POA: Diagnosis not present

## 2019-06-25 DIAGNOSIS — Z3A18 18 weeks gestation of pregnancy: Secondary | ICD-10-CM | POA: Diagnosis not present

## 2019-06-25 DIAGNOSIS — O162 Unspecified maternal hypertension, second trimester: Secondary | ICD-10-CM | POA: Diagnosis not present

## 2019-06-25 DIAGNOSIS — G932 Benign intracranial hypertension: Secondary | ICD-10-CM | POA: Diagnosis not present

## 2019-06-29 DIAGNOSIS — G932 Benign intracranial hypertension: Secondary | ICD-10-CM | POA: Diagnosis not present

## 2019-06-29 DIAGNOSIS — O162 Unspecified maternal hypertension, second trimester: Secondary | ICD-10-CM | POA: Diagnosis not present

## 2019-06-29 DIAGNOSIS — Z3A00 Weeks of gestation of pregnancy not specified: Secondary | ICD-10-CM | POA: Diagnosis not present

## 2019-06-29 DIAGNOSIS — Z982 Presence of cerebrospinal fluid drainage device: Secondary | ICD-10-CM | POA: Diagnosis not present

## 2019-06-30 ENCOUNTER — Other Ambulatory Visit: Payer: Self-pay

## 2019-06-30 ENCOUNTER — Ambulatory Visit (INDEPENDENT_AMBULATORY_CARE_PROVIDER_SITE_OTHER): Payer: Medicaid Other | Admitting: Physician Assistant

## 2019-06-30 ENCOUNTER — Encounter: Payer: Self-pay | Admitting: Physician Assistant

## 2019-06-30 VITALS — BP 137/88 | HR 130 | Temp 98.7°F | Ht 68.0 in | Wt 256.0 lb

## 2019-06-30 DIAGNOSIS — L089 Local infection of the skin and subcutaneous tissue, unspecified: Secondary | ICD-10-CM

## 2019-06-30 DIAGNOSIS — L723 Sebaceous cyst: Secondary | ICD-10-CM

## 2019-06-30 MED ORDER — CLINDAMYCIN HCL 300 MG PO CAPS: 300.0000 mg | ORAL_CAPSULE | Freq: Three times a day (TID) | ORAL | 0 refills | Status: DC

## 2019-06-30 NOTE — Patient Instructions (Signed)
Incision and Drainage, Care After  This sheet gives you information about how to care for yourself after your procedure. Your health care provider may also give you more specific instructions. If you have problems or questions, contact your health care provider.  What can I expect after the procedure?  After the procedure, it is common to have:  · Pain or discomfort around the incision site.  · Blood, fluid, or pus (drainage) from the incision.  · Redness and firm skin around the incision site.  Follow these instructions at home:  Medicines  · Take over-the-counter and prescription medicines only as told by your health care provider.  · If you were prescribed an antibiotic medicine, use or take it as told by your health care provider. Do not stop using the antibiotic even if you start to feel better.  Wound care    Follow instructions from your health care provider about how to take care of your wound. Make sure you:  · Wash your hands with soap and water before and after you change your bandage (dressing). If soap and water are not available, use hand sanitizer.  · Change your dressing and packing as told by your health care provider.  ? If your dressing is dry or stuck when you try to remove it, moisten or wet the dressing with saline or water so that it can be removed without harming your skin or tissues.  ? If your wound is packed, leave it in place until your health care provider tells you to remove it. To remove the packing, moisten or wet the packing with saline or water so that it can be removed without harming your skin or tissues.  · Leave stitches (sutures), skin glue, or adhesive strips in place. These skin closures may need to stay in place for 2 weeks or longer. If adhesive strip edges start to loosen and curl up, you may trim the loose edges. Do not remove adhesive strips completely unless your health care provider tells you to do that.  Check your wound every day for signs of infection. Check  for:  · More redness, swelling, or pain.  · More fluid or blood.  · Warmth.  · Pus or a bad smell.  If you were sent home with a drain tube in place, follow instructions from your health care provider about:  · How to empty it.  · How to care for it at home.     General instructions  · Rest the affected area.  · Do not take baths, swim, or use a hot tub until your health care provider approves. Ask your health care provider if you may take showers. You may only be allowed to take sponge baths.  · Return to your normal activities as told by your health care provider. Ask your health care provider what activities are safe for you. Your health care provider may put you on activity or lifting restrictions.  · The incision will continue to drain. It is normal to have some clear or slightly bloody drainage. The amount of drainage should lessen each day.  · Do not apply any creams, ointments, or liquids unless you have been told to by your health care provider.  · Keep all follow-up visits as told by your health care provider. This is important.  Contact a health care provider if:  · Your cyst or abscess returns.  · You have a fever or chills.  · You have more redness, swelling, or pain   around your incision.  · You have more fluid or blood coming from your incision.  · Your incision feels warm to the touch.  · You have pus or a bad smell coming from your incision.  · You have red streaks above or below the incision site.  Get help right away if:  · You have severe pain or bleeding.  · You cannot eat or drink without vomiting.  · You have decreased urine output.  · You become short of breath.  · You have chest pain.  · You cough up blood.  · The affected area becomes numb or starts to tingle.  These symptoms may represent a serious problem that is an emergency. Do not wait to see if the symptoms will go away. Get medical help right away. Call your local emergency services (911 in the [place] Do not drive yourself to the  hospital.  Summary  · After this procedure, it is common to have fluid, blood, or pus coming from the surgery site.  · Follow all home care instructions. You will be told how to take care of your incision, how to check for infection, and how to take medicines.  · If you were prescribed an antibiotic medicine, take it as told by your health care provider. Do not stop taking the antibiotic even if you start to feel better.  · Contact a health care provider if you have increased redness, swelling, or pain around your incision. Get help right away if you have chest pain, you vomit, you cough up blood, or you have shortness of breath.  · Keep all follow-up visits as told by your health care provider. This is important.  This information is not intended to replace advice given to you by your health care provider. Make sure you discuss any questions you have with your health care provider.  Document Released: 11/19/2011 Document Revised: 07/28/2018 Document Reviewed: 07/28/2018  Elsevier Patient Education © 2020 [employer]   

## 2019-06-30 NOTE — Progress Notes (Signed)
Subjective:    Patient ID: Maria Payne, female    DOB: January 04, 1991, 28 y.o.   MRN: [id number]  HPI  Pt is a 28 yo pregnant female who presents to the clinic with "bump" on her left buttocks. She noticed it on Saturday and tried to pop it. She got some white stuff out of it initially but not all she can get is clear. Seems to be getting redder, more tender, and larger. It hurts to sit down. Her husband and son have had MRSA before.   .. Active Ambulatory Problems    Diagnosis Date Noted  . DERMATITIS, PERIORAL 05/17/2010  . 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 IIH (idiopathic intracranial hypertension) 09/10/2011  . MDD (major depressive disorder) 04/04/2012  . GAD (generalized anxiety disorder) 04/04/2012  . 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 Shunt malfunction 04/20/2017  . Papilledema associated with increased intracranial pressure 08/20/2011  . Pain of upper abdomen 11/21/2015  . Psychic factors associated with diseases classified elsewhere 12/31/2013  . Supervision of high risk pregnancy, antepartum 04/23/2019  . Obesity in pregnancy 04/23/2019   Resolved Ambulatory Problems    Diagnosis Date Noted  . FOOT PAIN 09/25/2010  . Acute maxillary sinusitis 10/20/2010   Past Medical History:  Diagnosis Date  . Asthma   . Depression   . Papilledema   . Pseudotumor cerebri        Review of Systems See HPI.     Objective:   Physical Exam Vitals signs reviewed.  Constitutional:      Appearance: Normal appearance.  Cardiovascular:     Rate and Rhythm: Normal rate.     Pulses: Normal pulses.  Pulmonary:     Effort: Pulmonary effort is normal.  Skin:    Comments: Left lateral buttocks: Large area of redness 7cm by 8cm with smaller area of induration about 3cm by 3cm. With punctate center. Tender to touch.   Neurological:     General: No focal deficit present.     Mental Status: She is alert and oriented to person, place, and time.            Assessment & Plan:  [name] KitchenMarland KitchenOrris was seen today for abscess.  Diagnoses and all orders for this visit:  Infected sebaceous cyst -     clindamycin (CLEOCIN) 300 MG capsule; Take 1 capsule (300 mg total) by mouth 3 (three) times daily. For 10 days.   Sebaceous Cyst Excision Procedure Note  Pre-operative Diagnosis: infected sebaceous cyst  Post-operative Diagnosis: same  Locations: left lateral buttocks  Indications: pain/infection/inflammation  Anesthesia: Lidocaine 1% with epinephrine without added sodium bicarbonate  Procedure Details  History of allergy to iodine: no  Patient informed of the risks (including bleeding and infection) and benefits of the  procedure and Verbal informed consent obtained.  The lesion and surrounding area was given a sterile prep using chlorhexidine and draped in the usual sterile fashion. An incision was made over the cyst, which was dissected free of the surrounding tissue and removed.  The cyst was filled with typical sebaceous material.  The wound was packed with antibiotic packing and covered with a gauze. Antibiotic ointment and a sterile dressing applied.  The specimen was not sent for pathologic examination. The patient tolerated the procedure well.  EBL: scant  Findings:infected seb cyst  Condition: Stable  Complications: pain.  Plan: 1. Instructed to keep the wound dry and covered for 24-48h and clean thereafter. 2. Warning signs of infection were reviewed.  3. Recommended that the patient use OTC acetaminophen as needed for pain.   Pt is pregnant sent clindamycin to cover for MRSA and safe for pregnancy. Due to infection packed wound instead of suturing. Follow up in 1 week.

## 2019-07-01 ENCOUNTER — Encounter: Payer: Self-pay | Admitting: Physician Assistant

## 2019-07-02 DIAGNOSIS — G932 Benign intracranial hypertension: Secondary | ICD-10-CM | POA: Diagnosis not present

## 2019-07-02 DIAGNOSIS — O162 Unspecified maternal hypertension, second trimester: Secondary | ICD-10-CM | POA: Diagnosis not present

## 2019-07-02 DIAGNOSIS — Z3A18 18 weeks gestation of pregnancy: Secondary | ICD-10-CM | POA: Diagnosis not present

## 2019-07-02 DIAGNOSIS — Z982 Presence of cerebrospinal fluid drainage device: Secondary | ICD-10-CM | POA: Diagnosis not present

## 2019-07-03 ENCOUNTER — Ambulatory Visit (INDEPENDENT_AMBULATORY_CARE_PROVIDER_SITE_OTHER): Payer: Medicaid Other | Admitting: Physician Assistant

## 2019-07-03 ENCOUNTER — Other Ambulatory Visit: Payer: Self-pay

## 2019-07-03 VITALS — Ht 68.0 in | Wt 256.0 lb

## 2019-07-03 DIAGNOSIS — L089 Local infection of the skin and subcutaneous tissue, unspecified: Secondary | ICD-10-CM

## 2019-07-03 DIAGNOSIS — L723 Sebaceous cyst: Secondary | ICD-10-CM | POA: Diagnosis not present

## 2019-07-03 LAB — CBC WITH DIFFERENTIAL/PLATELET
Absolute Monocytes: 318 cells/uL (ref 200–950)
Basophils Absolute: 22 cells/uL (ref 0–200)
Basophils Relative: 0.3 %
Eosinophils Absolute: 59 cells/uL (ref 15–500)
Eosinophils Relative: 0.8 %
HCT: 34.3 % — ABNORMAL LOW (ref 35.0–45.0)
Hemoglobin: 11.4 g/dL — ABNORMAL LOW (ref 11.7–15.5)
Lymphs Abs: 1140 cells/uL (ref 850–3900)
MCH: 29 pg (ref 27.0–33.0)
MCHC: 33.2 g/dL (ref 32.0–36.0)
MCV: 87.3 fL (ref 80.0–100.0)
MPV: 9.9 fL (ref 7.5–12.5)
Monocytes Relative: 4.3 %
Neutro Abs: 5861 cells/uL (ref 1500–7800)
Neutrophils Relative %: 79.2 %
Platelets: 280 10*3/uL (ref 140–400)
RBC: 3.93 10*6/uL (ref 3.80–5.10)
RDW: 13 % (ref 11.0–15.0)
Total Lymphocyte: 15.4 %
WBC: 7.4 10*3/uL (ref 3.8–10.8)

## 2019-07-03 NOTE — Progress Notes (Signed)
   Subjective:    Patient ID: Maria Payne, female    DOB: 15-Apr-1991, 28 y.o.   MRN: [id number]  HPI Pt is a 28 yo pregnant female who presents to the clinic for follow up on infected sebaceous cyst that was I and D on 10/23. Pt is concerned because of the pain she is still in. She is using tylenol 4 times a day but not taking the pain way. She continues to take her clindamycin. Her wound continues to drain some and there is packing left in the wound. No fever, chills, nausea.   .. Active Ambulatory Problems    Diagnosis Date Noted  . DERMATITIS, PERIORAL 05/17/2010  . 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 IIH (idiopathic intracranial hypertension) 09/10/2011  . MDD (major depressive disorder) 04/04/2012  . GAD (generalized anxiety disorder) 04/04/2012  . 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 Shunt malfunction 04/20/2017  . Papilledema associated with increased intracranial pressure 08/20/2011  . Pain of upper abdomen 11/21/2015  . Psychic factors associated with diseases classified elsewhere 12/31/2013  . Supervision of high risk pregnancy, antepartum 04/23/2019  . Obesity in pregnancy 04/23/2019   Resolved Ambulatory Problems    Diagnosis Date Noted  . FOOT PAIN 09/25/2010  . Acute maxillary sinusitis 10/20/2010   Past Medical History:  Diagnosis Date  . Asthma   . Depression   . Papilledema   . Pseudotumor cerebri       Review of Systems See HPI.     Objective:   Physical Exam Vitals signs reviewed.  Constitutional:      Appearance: Normal appearance. She is obese.  Skin:    Comments: Left lateral buttocks: significantly reduced erythema surrounding incision and wound decreased to 2cm total diameter with 1cm incision opening. Packing in place and removed with no active drainage. Small area of bruising surrounding the wound. No warm to touch. Pt does report tenderness.   Neurological:     General: No focal deficit present.     Mental Status: She  is alert and oriented to person, place, and time.           Assessment & Plan:  [name] KitchenMarland KitchenYaneisi was seen today for wound check.  Diagnoses and all orders for this visit:  Infected sebaceous cyst -     CBC with Differential/Platelet   Wound appears to be improving. Surrounding cellulitis seems to be improving significantly. Removed packing and placed a gauze over wound for any residual drainage. Perhaps the packing being removed will take some pressure off wound. Switch to cool compresses now for pain. Pt has intolerance to norco. No other pain medication was given due to pregnancy status.    Discussed with supervising physician, Dr. Madilyn Fireman and agrees with plan.

## 2019-07-05 ENCOUNTER — Encounter: Payer: Self-pay | Admitting: Physician Assistant

## 2019-07-06 ENCOUNTER — Encounter: Payer: Self-pay | Admitting: Physician Assistant

## 2019-07-06 NOTE — Progress Notes (Signed)
No concerns with blood work. You are anemic but you are pregnant. Consider adding some extra iron and increase iron rich foods. Is your wound feeling any better?

## 2019-07-13 ENCOUNTER — Other Ambulatory Visit: Payer: Self-pay

## 2019-07-13 ENCOUNTER — Encounter (HOSPITAL_COMMUNITY): Payer: Self-pay | Admitting: *Deleted

## 2019-07-13 ENCOUNTER — Ambulatory Visit (HOSPITAL_COMMUNITY)
Admission: RE | Admit: 2019-07-13 | Discharge: 2019-07-13 | Disposition: A | Discharge status: Home / Self Care | Payer: Medicaid Other | Source: Ambulatory Visit | Attending: Obstetrics and Gynecology | Admitting: Obstetrics and Gynecology

## 2019-07-13 ENCOUNTER — Ambulatory Visit (HOSPITAL_COMMUNITY): Payer: Medicaid Other | Admitting: *Deleted

## 2019-07-13 ENCOUNTER — Other Ambulatory Visit (HOSPITAL_COMMUNITY): Payer: Self-pay | Admitting: *Deleted

## 2019-07-13 DIAGNOSIS — O3412 Maternal care for benign tumor of corpus uteri, second trimester: Secondary | ICD-10-CM | POA: Diagnosis not present

## 2019-07-13 DIAGNOSIS — Z3A20 20 weeks gestation of pregnancy: Secondary | ICD-10-CM

## 2019-07-13 DIAGNOSIS — O2692 Pregnancy related conditions, unspecified, second trimester: Secondary | ICD-10-CM | POA: Diagnosis not present

## 2019-07-13 DIAGNOSIS — G932 Benign intracranial hypertension: Secondary | ICD-10-CM

## 2019-07-13 DIAGNOSIS — O99210 Obesity complicating pregnancy, unspecified trimester: Secondary | ICD-10-CM | POA: Insufficient documentation

## 2019-07-13 DIAGNOSIS — O0990 Supervision of high risk pregnancy, unspecified, unspecified trimester: Secondary | ICD-10-CM

## 2019-07-13 DIAGNOSIS — O99212 Obesity complicating pregnancy, second trimester: Secondary | ICD-10-CM

## 2019-07-14 ENCOUNTER — Ambulatory Visit: Payer: Medicaid Other | Admitting: Family Medicine

## 2019-07-16 ENCOUNTER — Encounter: Payer: Self-pay | Admitting: *Deleted

## 2019-07-17 ENCOUNTER — Ambulatory Visit (INDEPENDENT_AMBULATORY_CARE_PROVIDER_SITE_OTHER): Payer: Medicaid Other | Admitting: Certified Nurse Midwife

## 2019-07-17 ENCOUNTER — Other Ambulatory Visit: Payer: Self-pay

## 2019-07-17 VITALS — BP 137/87 | HR 113 | Wt 260.0 lb

## 2019-07-17 DIAGNOSIS — Z3A20 20 weeks gestation of pregnancy: Secondary | ICD-10-CM | POA: Diagnosis not present

## 2019-07-17 DIAGNOSIS — Z3402 Encounter for supervision of normal first pregnancy, second trimester: Secondary | ICD-10-CM

## 2019-07-17 DIAGNOSIS — O0992 Supervision of high risk pregnancy, unspecified, second trimester: Secondary | ICD-10-CM | POA: Diagnosis not present

## 2019-07-17 DIAGNOSIS — O0990 Supervision of high risk pregnancy, unspecified, unspecified trimester: Secondary | ICD-10-CM

## 2019-07-17 DIAGNOSIS — Z982 Presence of cerebrospinal fluid drainage device: Secondary | ICD-10-CM

## 2019-07-17 DIAGNOSIS — O99210 Obesity complicating pregnancy, unspecified trimester: Secondary | ICD-10-CM

## 2019-07-17 DIAGNOSIS — O99212 Obesity complicating pregnancy, second trimester: Secondary | ICD-10-CM | POA: Diagnosis not present

## 2019-07-17 NOTE — Progress Notes (Signed)
Subjective:  Maria Payne is a 28 y.o. G1P0000 at [redacted]w[redacted]d being seen today for ongoing prenatal care.  She is currently monitored for the following issues for this high-risk pregnancy and has DERMATITIS, PERIORAL; Cerebral venous sinus thrombosis; IIH (idiopathic intracranial hypertension); MDD (major depressive disorder); GAD (generalized anxiety disorder); S/P ventriculoperitoneal shunt; Shunt malfunction; Papilledema associated with increased intracranial pressure; Pain of upper abdomen; Psychic factors associated with diseases classified elsewhere; Supervision of high risk pregnancy, antepartum; and Obesity in pregnancy on their problem list.  Patient reports persistent HAs, and pain in RUQ d/t shunt- takes Tylenol, doesn't help much.  Contractions: Not present. Vag. Bleeding: None.  Movement: Present. Denies leaking of fluid.   The following portions of the patient's history were reviewed and updated as appropriate: allergies, current medications, past family history, past medical history, past social history, past surgical history and problem list. Problem list updated.  Objective:   Vitals:   07/17/19 0951  BP: 137/87  Pulse: (!) 113  Weight: 260 lb (117.9 kg)    Fetal Status: Fetal Heart Rate (bpm): 156   Movement: Present     General:  Alert, oriented and cooperative. Patient is in no acute distress.  Skin: Skin is warm and dry. No rash noted.   Cardiovascular: Normal heart rate noted  Respiratory: Normal respiratory effort, no problems with respiration noted  Abdomen: Soft, gravid, appropriate for gestational age. Pain/Pressure: Present     Pelvic: Vag. Bleeding: None Vag D/C Character: Thin   Cervical exam deferred        Extremities: Normal range of motion.  Edema: None  Mental Status: Normal mood and affect. Normal behavior. Normal judgment and thought content.   Urinalysis:      Assessment and Plan:  Pregnancy: G1P0000 at [redacted]w[redacted]d  1. Encounter for supervision of normal  first pregnancy in second trimester - AFP only - watch BP- denies hx of CHTN, pt thinks d/t pain - BP 148/76 08/11/2018 at office visit - anatomy US incomplete> f/u scheduled  2. S/P ventriculoperitoneal shunt - surgery scheduled at [facility] next week to redirect shunt, planning local and sedation  Preterm labor symptoms and general obstetric precautions including but not limited to vaginal bleeding, contractions, leaking of fluid and fetal movement were reviewed in detail with the patient. Please refer to After Visit Summary for other counseling recommendations.  Return in about 4 weeks (around 08/14/2019). virtual   Julianne Handler, CNM

## 2019-07-21 ENCOUNTER — Telehealth: Payer: Self-pay | Admitting: *Deleted

## 2019-07-21 DIAGNOSIS — Z01818 Encounter for other preprocedural examination: Secondary | ICD-10-CM | POA: Diagnosis not present

## 2019-07-21 LAB — ALPHA FETOPROTEIN, MATERNAL
AFP MoM: 1.61
AFP, Serum: 72.6 ng/mL
Calc'd Gestational Age: 20.6 weeks
Maternal Wt: 256 [lb_av]
Risk for ONTD: 1
Twins-AFP: 1

## 2019-07-21 LAB — TIQ-AOE

## 2019-07-21 MED ORDER — ESCITALOPRAM OXALATE 20 MG PO TABS: 20.0000 mg | ORAL_TABLET | Freq: Every day | ORAL | 6 refills | Status: DC

## 2019-07-21 NOTE — Telephone Encounter (Signed)
RX Rf on Lexapro sent to [facility] in [place].

## 2019-07-24 DIAGNOSIS — T8501XA Breakdown (mechanical) of ventricular intracranial (communicating) shunt, initial encounter: Secondary | ICD-10-CM | POA: Diagnosis not present

## 2019-07-24 DIAGNOSIS — T85840A Pain due to nervous system prosthetic devices, implants and grafts, initial encounter: Secondary | ICD-10-CM | POA: Diagnosis not present

## 2019-07-24 DIAGNOSIS — G932 Benign intracranial hypertension: Secondary | ICD-10-CM | POA: Diagnosis not present

## 2019-07-24 DIAGNOSIS — O162 Unspecified maternal hypertension, second trimester: Secondary | ICD-10-CM | POA: Diagnosis not present

## 2019-07-24 DIAGNOSIS — I10 Essential (primary) hypertension: Secondary | ICD-10-CM | POA: Diagnosis not present

## 2019-07-24 DIAGNOSIS — O26899 Other specified pregnancy related conditions, unspecified trimester: Secondary | ICD-10-CM | POA: Diagnosis not present

## 2019-07-24 DIAGNOSIS — E669 Obesity, unspecified: Secondary | ICD-10-CM | POA: Diagnosis not present

## 2019-07-24 DIAGNOSIS — O99352 Diseases of the nervous system complicating pregnancy, second trimester: Secondary | ICD-10-CM | POA: Diagnosis not present

## 2019-07-24 DIAGNOSIS — Z3A00 Weeks of gestation of pregnancy not specified: Secondary | ICD-10-CM | POA: Diagnosis not present

## 2019-08-10 ENCOUNTER — Ambulatory Visit (HOSPITAL_COMMUNITY)
Admission: RE | Admit: 2019-08-10 | Discharge: 2019-08-10 | Disposition: A | Discharge status: Home / Self Care | Payer: Medicaid Other | Source: Ambulatory Visit | Attending: Obstetrics and Gynecology | Admitting: Obstetrics and Gynecology

## 2019-08-10 ENCOUNTER — Encounter (HOSPITAL_COMMUNITY): Payer: Self-pay

## 2019-08-10 ENCOUNTER — Other Ambulatory Visit: Payer: Self-pay

## 2019-08-10 ENCOUNTER — Ambulatory Visit (HOSPITAL_COMMUNITY): Payer: Medicaid Other | Admitting: *Deleted

## 2019-08-10 DIAGNOSIS — O0990 Supervision of high risk pregnancy, unspecified, unspecified trimester: Secondary | ICD-10-CM | POA: Insufficient documentation

## 2019-08-10 DIAGNOSIS — G932 Benign intracranial hypertension: Secondary | ICD-10-CM | POA: Diagnosis not present

## 2019-08-10 DIAGNOSIS — O99210 Obesity complicating pregnancy, unspecified trimester: Secondary | ICD-10-CM | POA: Insufficient documentation

## 2019-08-10 DIAGNOSIS — Z3A24 24 weeks gestation of pregnancy: Secondary | ICD-10-CM | POA: Diagnosis not present

## 2019-08-10 DIAGNOSIS — O99212 Obesity complicating pregnancy, second trimester: Secondary | ICD-10-CM

## 2019-08-10 DIAGNOSIS — O2692 Pregnancy related conditions, unspecified, second trimester: Secondary | ICD-10-CM

## 2019-08-10 DIAGNOSIS — Z362 Encounter for other antenatal screening follow-up: Secondary | ICD-10-CM

## 2019-08-11 ENCOUNTER — Other Ambulatory Visit (HOSPITAL_COMMUNITY): Payer: Self-pay | Admitting: *Deleted

## 2019-08-11 DIAGNOSIS — O10913 Unspecified pre-existing hypertension complicating pregnancy, third trimester: Secondary | ICD-10-CM

## 2019-08-13 ENCOUNTER — Encounter: Payer: Self-pay | Admitting: Family Medicine

## 2019-08-13 ENCOUNTER — Other Ambulatory Visit: Payer: Self-pay

## 2019-08-13 DIAGNOSIS — Z20822 Contact with and (suspected) exposure to covid-19: Secondary | ICD-10-CM

## 2019-08-13 DIAGNOSIS — Z20828 Contact with and (suspected) exposure to other viral communicable diseases: Secondary | ICD-10-CM | POA: Diagnosis not present

## 2019-08-13 NOTE — Telephone Encounter (Signed)
FYI to PCP

## 2019-08-13 NOTE — Telephone Encounter (Signed)
I spoke with patient, she is going to get COVID tested today and she has virtual appt with [facility] tomorrow.

## 2019-08-14 ENCOUNTER — Ambulatory Visit (INDEPENDENT_AMBULATORY_CARE_PROVIDER_SITE_OTHER): Payer: Medicaid Other | Admitting: Physician Assistant

## 2019-08-14 ENCOUNTER — Telehealth (INDEPENDENT_AMBULATORY_CARE_PROVIDER_SITE_OTHER): Payer: Medicaid Other | Admitting: Certified Nurse Midwife

## 2019-08-14 ENCOUNTER — Encounter: Payer: Self-pay | Admitting: Physician Assistant

## 2019-08-14 VITALS — BP 123/84 | HR 106 | Wt 258.0 lb

## 2019-08-14 VITALS — BP 124/90 | Temp 96.5°F | Ht 68.0 in | Wt 250.0 lb

## 2019-08-14 DIAGNOSIS — O99212 Obesity complicating pregnancy, second trimester: Secondary | ICD-10-CM

## 2019-08-14 DIAGNOSIS — O99210 Obesity complicating pregnancy, unspecified trimester: Secondary | ICD-10-CM

## 2019-08-14 DIAGNOSIS — O0992 Supervision of high risk pregnancy, unspecified, second trimester: Secondary | ICD-10-CM | POA: Diagnosis not present

## 2019-08-14 DIAGNOSIS — Z3A24 24 weeks gestation of pregnancy: Secondary | ICD-10-CM

## 2019-08-14 DIAGNOSIS — R030 Elevated blood-pressure reading, without diagnosis of hypertension: Secondary | ICD-10-CM

## 2019-08-14 DIAGNOSIS — R0989 Other specified symptoms and signs involving the circulatory and respiratory systems: Secondary | ICD-10-CM

## 2019-08-14 DIAGNOSIS — O0990 Supervision of high risk pregnancy, unspecified, unspecified trimester: Secondary | ICD-10-CM

## 2019-08-14 DIAGNOSIS — J4521 Mild intermittent asthma with (acute) exacerbation: Secondary | ICD-10-CM | POA: Diagnosis not present

## 2019-08-14 DIAGNOSIS — I10 Essential (primary) hypertension: Secondary | ICD-10-CM | POA: Insufficient documentation

## 2019-08-14 MED ORDER — PREDNISONE 20 MG PO TABS: ORAL_TABLET | ORAL | 0 refills | Status: DC

## 2019-08-14 NOTE — Patient Instructions (Signed)
Carpal Tunnel Syndrome  Carpal tunnel syndrome is a condition that causes pain in your hand and arm. The carpal tunnel is a narrow area located on the palm side of your wrist. Repeated wrist motion or certain diseases may cause swelling within the tunnel. This swelling pinches the main nerve in the wrist (median nerve). What are the causes? This condition may be caused by:  Repeated wrist motions.  Wrist injuries.  Arthritis.  A cyst or tumor in the carpal tunnel.  Fluid buildup during pregnancy. Sometimes the cause of this condition is not known. What increases the risk? The following factors may make you more likely to develop this condition:  Having a job, such as being a butcher or a cashier, that requires you to repeatedly move your wrist in the same motion.  Being a woman.  Having certain conditions, such as: ? Diabetes. ? Obesity. ? An underactive thyroid (hypothyroidism). ? Kidney failure. What are the signs or symptoms? Symptoms of this condition include:  A tingling feeling in your fingers, especially in your thumb, index, and middle fingers.  Tingling or numbness in your hand.  An aching feeling in your entire arm, especially when your wrist and elbow are bent for a long time.  Wrist pain that goes up your arm to your shoulder.  Pain that goes down into your palm or fingers.  A weak feeling in your hands. You may have trouble grabbing and holding items. Your symptoms may feel worse during the night. How is this diagnosed? This condition is diagnosed with a medical history and physical exam. You may also have tests, including:  Electromyogram (EMG). This test measures electrical signals sent by your nerves into the muscles.  Nerve conduction study. This test measures how well electrical signals pass through your nerves.  Imaging tests, such as X-rays, ultrasound, and MRI. These tests check for possible causes of your condition. How is this treated? This  condition may be treated with:  Lifestyle changes. It is important to stop or change the activity that caused your condition.  Doing exercise and activities to strengthen your muscles and bones (physical therapy).  Learning how to use your hand again after diagnosis (occupational therapy).  Medicines for pain and inflammation. This may include medicine that is injected into your wrist.  A wrist splint.  Surgery. Follow these instructions at home: If you have a splint:  Wear the splint as told by your health care provider. Remove it only as told by your health care provider.  Loosen the splint if your fingers tingle, become numb, or turn cold and blue.  Keep the splint clean.  If the splint is not waterproof: ? Do not let it get wet. ? Cover it with a watertight covering when you take a bath or shower. Managing pain, stiffness, and swelling   If directed, put ice on the painful area: ? If you have a removable splint, remove it as told by your health care provider. ? Put ice in a plastic bag. ? Place a towel between your skin and the bag. ? Leave the ice on for 20 minutes, 2-3 times per day. General instructions  Take over-the-counter and prescription medicines only as told by your health care provider.  Rest your wrist from any activity that may be causing your pain. If your condition is work related, talk with your employer about changes that can be made, such as getting a wrist pad to use while typing.  Do any exercises as told   by your health care provider, physical therapist, or occupational therapist.  Keep all follow-up visits as told by your health care provider. This is important. Contact a health care provider if:  You have new symptoms.  Your pain is not controlled with medicines.  Your symptoms get worse. Get help right away if:  You have severe numbness or tingling in your wrist or hand. Summary  Carpal tunnel syndrome is a condition that causes pain in  your hand and arm.  It is usually caused by repeated wrist motions.  Lifestyle changes and medicines are used to treat carpal tunnel syndrome. Surgery may be recommended.  Follow your health care provider's instructions about wearing a splint, resting from activity, keeping follow-up visits, and calling for help. This information is not intended to replace advice given to you by your health care provider. Make sure you discuss any questions you have with your health care provider. Document Released: 08/24/2000 Document Revised: 01/03/2018 Document Reviewed: 01/03/2018 Elsevier Patient Education  2020 Elsevier Inc.  

## 2019-08-14 NOTE — Progress Notes (Signed)
Patient ID: Maria Payne, female   DOB: June 26, 1991, 28 y.o.   MRN: [id number] .[name] KitchenVirtual Visit via Video Note  I connected with CYNDE CABAL on 08/14/19 at  4:20 PM EST by a video enabled telemedicine application and verified that I am speaking with the correct person using two identifiers.  Location: Patient: home Provider: clinic   I discussed the limitations of evaluation and management by telemedicine and the availability of in person appointments. The patient expressed understanding and agreed to proceed.  History of Present Illness: Patient is a 28 year old female who is [redacted] weeks pregnant with history of well-controlled asthma who calls into the clinic with wheezing and cough.  She denies any fever, chills, loss of smell or taste, GI symptoms, sinus pressure, sinus discharge, ear pain, sore throat.  Her cough is dry.  She is using Mucinex but does not seem to help symptoms.  She is using her albuterol inhaler and does help temporarily.  She does feel achy.  She denies any sick contacts and/or direct Covid exposure.  She is usually controlled with intermittent albuterol with her asthma.   .. Active Ambulatory Problems    Diagnosis Date Noted  . DERMATITIS, PERIORAL 05/17/2010  . 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 IIH (idiopathic intracranial hypertension) 09/10/2011  . MDD (major depressive disorder) 04/04/2012  . GAD (generalized anxiety disorder) 04/04/2012  . 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 Shunt malfunction 04/20/2017  . Papilledema associated with increased intracranial pressure 08/20/2011  . Pain of upper abdomen 11/21/2015  . Psychic factors associated with diseases classified elsewhere 12/31/2013  . Supervision of high risk pregnancy, antepartum 04/23/2019  . Obesity in pregnancy 04/23/2019  . Hypertension 08/14/2019   Resolved Ambulatory Problems    Diagnosis Date Noted  . FOOT PAIN 09/25/2010  . Acute maxillary sinusitis 10/20/2010   Past  Medical History:  Diagnosis Date  . Asthma   . Depression   . Papilledema   . Pseudotumor cerebri    Reviewed med, allergy, problem list.     Observations/Objective: No acute distress. No active wheezing or coughing.  Normal appearance and mood.   .. Today's Vitals   08/14/19 0921  BP: 124/90  Temp: (!) 96.5 F (35.8 C)  TempSrc: Oral  Weight: 250 lb (113.4 kg)  Height: 5\' 8"  (1.727 m)   Body mass index is 38.01 kg/m.    Assessment and Plan: [name] KitchenMarland KitchenJule was seen today for generalized body aches.  Diagnoses and all orders for this visit:  Mild intermittent asthma with exacerbation -     predniSONE (DELTASONE) 20 MG tablet; Take one tablet twice a day for 5 days.  I see no signs of bacterial infection today.  Certainly this could be induced by some viral etiology.  We cannot rule out Covid.  Suggested for her to be tested and self isolate until she gets results.  Suspect likely asthma exacerbation.  Treated with prednisone.  Rest and hydrate.  Let me know how you are doing on Monday and how symptoms have progressed or improved.    Follow Up Instructions:    I discussed the assessment and treatment plan with the patient. The patient was provided an opportunity to ask questions and all were answered. The patient agreed with the plan and demonstrated an understanding of the instructions.   The patient was advised to call back or seek an in-person evaluation if the symptoms worsen or if the condition fails to improve as anticipated.   Iran Planas, PA-C

## 2019-08-14 NOTE — Progress Notes (Addendum)
TELEHEALTH VIRTUAL OBSTETRICS VISIT ENCOUNTER NOTE  I connected with Maria Payne on 08/14/19 at  9:15 AM EST by telephone at home and verified that I am speaking with the correct person using two identifiers. I discussed the limitations, risks, security and privacy concerns of performing an evaluation and management service by telephone and the availability of in person appointments. I also discussed with the patient that there may be a patient responsible charge related to this service. The patient expressed understanding and agreed to proceed.  Subjective:  Maria Payne is a 28 y.o. G1P0000 at [redacted]w[redacted]d being followed for ongoing prenatal care.  She is currently monitored for the following issues for this high-risk pregnancy and has DERMATITIS, PERIORAL; Cerebral venous sinus thrombosis; IIH (idiopathic intracranial hypertension); MDD (major depressive disorder); GAD (generalized anxiety disorder); S/P ventriculoperitoneal shunt; Shunt malfunction; Papilledema associated with increased intracranial pressure; Pain of upper abdomen; Psychic factors associated with diseases classified elsewhere; Supervision of high risk pregnancy, antepartum; and Obesity in pregnancy on their problem list.  Patient reports cough, congestion, and tightness in chest. Denies chest pain. Has history of asthma, used inhaler and did breathing treatment last night with improvement of symptoms. Is also taking Mucinex, which is also helping. Had a COVID test done yesterday, still waiting for results. Has appointment later today with PCP. She also reports worsening carpal tunnel symptoms in both hands, worse in right hand. Reports she is using splints and Tylenol, which are not helping. She is also concerned about her blood pressure being "really high" at neurology appointment (120s/90s). She denies HA, vision changes, dizziness or RUQ pain. Had shunt redirection at [place] 3 weeks ago. Reporting improvement in symptoms since  procedure. Reports fetal movement. Denies any contractions, bleeding or leaking of fluid.   The following portions of the patient's history were reviewed and updated as appropriate: allergies, current medications, past family history, past medical history, past social history, past surgical history and problem list.   Objective:   General:  Alert, oriented and cooperative.   Mental Status: Normal mood and affect perceived. Normal judgment and thought content.  Rest of physical exam deferred due to type of encounter Today's Vitals   08/14/19 0909  BP: 123/84  Pulse: (!) 106  Weight: 117 kg   Body mass index is 39.23 kg/m.  Assessment and Plan:  Pregnancy: G1P0000 at [redacted]w[redacted]d 1. Supervision of high risk pregnancy, antepartum - Worsening carpal tunnel symptoms. Encouraged continued splint use and Tylenol use. Pt given information on carpal tunnel management - GTT and labs at next visit - Anticipatory guidance for upcoming appoinments  2. Respiratory symptoms - C/o cough, congestion, and chest tightness. Symptoms improved with breathing treatment. Had COVID test yesterday. Appointment with PCP today  3. Borderline high blood pressure - Given elevated BP's at office visit prior to pregnancy, continue to watch BP's. Will do CMP and protein/creatnine ratio at next visit   Preterm labor symptoms and general obstetric precautions including but not limited to vaginal bleeding, contractions, leaking of fluid and fetal movement were reviewed in detail with the patient.  I discussed the assessment and treatment plan with the patient. The patient was provided an opportunity to ask questions and all were answered. The patient agreed with the plan and demonstrated an understanding of the instructions. The patient was advised to call back or seek an in-person office evaluation/go to [facility] for any urgent or concerning symptoms. Please refer to After Visit Summary for other  counseling recommendations.   I provided 16 minutes of face-to-face time during this encounter.  Follow up in 4 weeks  Future Appointments  Date Time Provider [place]  08/14/2019  4:20 PM Donella Stade, [place] [facility] None  09/14/2019  3:45 PM [place] NURSE [place] [facility]  09/14/2019  3:45 PM [place] 2 [facility]  09/15/2019  8:15 AM Lajean Manes, CNM [facility]    Maryagnes Amos, SNM

## 2019-08-14 NOTE — Progress Notes (Signed)
Symptoms started Wednesday:  Chest congestion/drainage Body aches Nausea No change in taste/smell  No Sore throat, fevers/chills  Feels like bronchitis per patient, has asthma and gets this time of year

## 2019-08-14 NOTE — Progress Notes (Signed)
Pt has been fighting cold symptoms and was tested for Covid yesterday

## 2019-08-17 ENCOUNTER — Encounter: Payer: Self-pay | Admitting: Physician Assistant

## 2019-08-17 LAB — NOVEL CORONAVIRUS, NAA: SARS-CoV-2, NAA: NOT DETECTED

## 2019-09-09 ENCOUNTER — Encounter: Payer: Self-pay | Admitting: Family Medicine

## 2019-09-09 ENCOUNTER — Telehealth (INDEPENDENT_AMBULATORY_CARE_PROVIDER_SITE_OTHER): Payer: Medicaid Other | Admitting: Family Medicine

## 2019-09-09 VITALS — HR 78

## 2019-09-09 DIAGNOSIS — J4551 Severe persistent asthma with (acute) exacerbation: Secondary | ICD-10-CM | POA: Diagnosis not present

## 2019-09-09 DIAGNOSIS — R059 Cough, unspecified: Secondary | ICD-10-CM

## 2019-09-09 DIAGNOSIS — J019 Acute sinusitis, unspecified: Secondary | ICD-10-CM

## 2019-09-09 DIAGNOSIS — R05 Cough: Secondary | ICD-10-CM | POA: Diagnosis not present

## 2019-09-09 MED ORDER — PREDNISONE 20 MG PO TABS: 40.0000 mg | ORAL_TABLET | Freq: Every day | ORAL | 0 refills | Status: DC

## 2019-09-09 MED ORDER — AMOXICILLIN-POT CLAVULANATE 875-125 MG PO TABS: 1.0000 | ORAL_TABLET | Freq: Two times a day (BID) | ORAL | 0 refills | Status: DC

## 2019-09-09 MED ORDER — METHYLPREDNISOLONE SODIUM SUCC 125 MG IJ SOLR
125.0000 mg | Freq: Once | INTRAMUSCULAR | Status: AC
  Administered 2019-09-09: 125 mg via INTRAMUSCULAR

## 2019-09-09 NOTE — Progress Notes (Signed)
Virtual Visit via Video Note  I connected with Maria Payne on 09/09/19 at  9:30 AM EST by a video enabled telemedicine application and verified that I am speaking with the correct person using two identifiers.   I discussed the limitations of evaluation and management by telemedicine and the availability of in person appointments. The patient expressed understanding and agreed to proceed.    Acute Office Visit  Subjective:    Patient ID: Maria Payne, female    DOB: 12-Dec-1990, 28 y.o.   MRN: [id number]  Chief Complaint  Patient presents with  . Sinusitis    HPI Patient is in today for with sinus sxs x 6 to 7 days.  She says that her stepson came home with a runny nose and her symptoms started right after that.  She has a history of asthma.  + nasal congestion with brown mucous. Now more clear.  Feels SOB, her back is hurting bilaterally with cough.  Felt SOB.  Feels like her  NEBs/inhaler is not working.  She is in her third trimester of pregnancy.  No loss of taste or smell. No fever, chills. No fatigue. + diarrhea. + post nasal drip.  She was hearing a lot of wheezing in her chest.   She says she has not felt this short of breath since she was first diagnosed with asthma.  She does not have a home pulse oximeter.  Past Medical History:  Diagnosis Date  . Asthma   . 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 Had diagnostic cerebral arteriogram.     . Depression   . GAD (generalized anxiety disorder) 04/04/2012  . Papilledema    Diamox  . Pseudotumor cerebri     Past Surgical History:  Procedure Laterality Date  . ANGIOPLASTY  2012   ? cerebral artery   . CHOLECYSTECTOMY    . SHUNT REVISION     4 times, last time 07/24/19  . SLEEVE GASTROPLASTY  05/2014   Dr. Evorn Gong   . Stent placed  August 2014   Stenosis right transverse sigmoid junction,[facility]  . VENTRICULAR ATRIAL SHUNT    . VP shunt  11/2013   Done at [facility]    Family History   Problem Relation Age of Onset  . Hypertension Father   . Heart disease Father   . Diabetes Father   . Diabetes Mother     Social History   Socioeconomic History  . Marital status: Married    Spouse name: Not on file  . Number of children: 0  . Years of education: Not on file  . Highest education level: Not on file  Occupational History  . Occupation: unemployed.   . Occupation: CUSTOMER SERVICE    Employer: [employer]  Tobacco Use  . Smoking status: Never Smoker  . Smokeless tobacco: Never Used  Substance and Sexual Activity  . Alcohol use: Not Currently    Alcohol/week: 0.0 standard drinks    Comment: occ  . Drug use: No  . Sexual activity: Yes    Partners: Male    Birth control/protection: None  Other Topics Concern  . Not on file  Social History Narrative   seperated from husband in nov 2014.  Still legally married.    Social Determinants of Health   Financial Resource Strain:   . Difficulty of Paying Living Expenses: Not on file  Food Insecurity:   . Worried About Charity fundraiser in the Last Year: Not  on file  . Ran Out of Food in the Last Year: Not on file  Transportation Needs:   . Lack of Transportation (Medical): Not on file  . Lack of Transportation (Non-Medical): Not on file  Physical Activity:   . Days of Exercise per Week: Not on file  . Minutes of Exercise per Session: Not on file  Stress:   . Feeling of Stress : Not on file  Social Connections:   . Frequency of Communication with Friends and Family: Not on file  . Frequency of Social Gatherings with Friends and Family: Not on file  . Attends Religious Services: Not on file  . Active Member of Clubs or Organizations: Not on file  . Attends Archivist Meetings: Not on file  . Marital Status: Not on file  Intimate Partner Violence:   . Fear of Current or Ex-Partner: Not on file  . Emotionally Abused: Not on file  . Physically Abused: Not on file  . Sexually Abused: Not on file     Outpatient Medications Prior to Visit  Medication Sig Dispense Refill  . acetaminophen (TYLENOL) 500 MG tablet Take 1,000 mg by mouth 2 (two) times daily.    [name] Kitchen aspirin 81 MG chewable tablet Chew 1 tablet (81 mg total) by mouth daily. 90 tablet 3  . escitalopram (LEXAPRO) 20 MG tablet Take 1 tablet (20 mg total) by mouth daily. 30 tablet 6  . Prenatal Vit-Fe Fumarate-FA (MULTIVITAMIN-PRENATAL) 27-0.8 MG TABS tablet Take 1 tablet by mouth daily at 12 noon.    . predniSONE (DELTASONE) 20 MG tablet Take one tablet twice a day for 5 days. 10 tablet 0   No facility-administered medications prior to visit.    Allergies  Allergen Reactions  . Latex     Other reaction(s): Other Diagnosed with blood test  . Nsaids     Other reaction(s): Other contraindication  . Codeine   . Hydrocodone-Acetaminophen Rash    Headaches Morphine, Dilaudid OK  . Oxycodone Rash    Headaches  . Tape Rash    welps    Review of Systems     Objective:    Physical Exam Vitals reviewed.  Constitutional:      Appearance: She is well-developed.  HENT:     Head: Normocephalic and atraumatic.  Eyes:     Conjunctiva/sclera: Conjunctivae normal.  Pulmonary:     Effort: Pulmonary effort is normal.  Skin:    General: Skin is dry.     Coloration: Skin is not pale.  Neurological:     Mental Status: She is alert and oriented to person, place, and time.  Psychiatric:        Behavior: Behavior normal.     Pulse 78   LMP 02/23/2019 (Exact Date)   SpO2 99%  Wt Readings from Last 3 Encounters:  08/14/19 250 lb (113.4 kg)  08/14/19 258 lb (117 kg)  07/17/19 260 lb (117.9 kg)    There are no preventive care reminders to display for this patient.  There are no preventive care reminders to display for this patient.   Lab Results  Component Value Date   TSH 3.720 03/23/2014   Lab Results  Component Value Date   WBC 7.4 07/03/2019   HGB 11.4 (L) 07/03/2019   HCT 34.3 (L) 07/03/2019   MCV 87.3  07/03/2019   PLT 280 07/03/2019   Lab Results  Component Value Date   NA 136 05/13/2019   K 3.9 05/13/2019   CO2 22  05/13/2019   GLUCOSE 79 05/13/2019   BUN 9 05/13/2019   CREATININE 0.59 05/13/2019   BILITOT 0.3 05/13/2019   ALKPHOS 30 (L) 05/13/2019   AST 16 05/13/2019   ALT 12 05/13/2019   PROT 6.7 05/13/2019   ALBUMIN 3.5 05/13/2019   CALCIUM 9.1 05/13/2019   ANIONGAP 11 05/13/2019   Lab Results  Component Value Date   CHOL 191 03/23/2014   Lab Results  Component Value Date   HDL 38 (L) 03/23/2014   Lab Results  Component Value Date   LDLCALC 124 (H) 03/23/2014   Lab Results  Component Value Date   TRIG 147 03/23/2014   Lab Results  Component Value Date   CHOLHDL 5.0 03/23/2014   Lab Results  Component Value Date   HGBA1C 5.3 04/23/2019       Assessment & Plan:   Problem List Items Addressed This Visit      Respiratory   Severe persistent asthma with exacerbation - Primary   Relevant Medications   predniSONE (DELTASONE) 20 MG tablet   Other Relevant Orders   Novel Coronavirus, NAA ([facility])    Other Visit Diagnoses    Cough       Relevant Medications   methylPREDNISolone sodium succinate (SOLU-MEDROL) 125 mg/2 mL injection 125 mg (Completed)   Other Relevant Orders   Novel Coronavirus, NAA ([facility])   Acute non-recurrent sinusitis, unspecified location       Relevant Medications   predniSONE (DELTASONE) 20 MG tablet   amoxicillin-clavulanate (AUGMENTIN) 875-125 MG tablet   methylPREDNISolone sodium succinate (SOLU-MEDROL) 125 mg/2 mL injection 125 mg (Completed)   Other Relevant Orders   Novel Coronavirus, NAA ([facility])     Asthma exacerbation during third trimester pregnancy-I am concerned about the possibility of pneumonia but I really want to avoid any type of radiation or chest x-ray at this point.  We can go ahead and start oral prednisone and Augmentin.  She is going to come by the office for drive-by for Solu-Medrol 125 mg IM  injection as well as a Covid swab.  I am actually little less suspicious of Covid as she does not have any fatigue etc. but I do 1 to make sure as locally the numbers are extremely high here and her son did come home with a runny nose and her symptoms started soon after that.  Consider other causes such as pulmonary embolism etc. but this would be less likely.  Acute sinusitis-we will treat with Augmentin and prednisone.  Meds ordered this encounter  Medications  . predniSONE (DELTASONE) 20 MG tablet    Sig: Take 2 tablets (40 mg total) by mouth daily with breakfast.    Dispense:  10 tablet    Refill:  0  . amoxicillin-clavulanate (AUGMENTIN) 875-125 MG tablet    Sig: Take 1 tablet by mouth 2 (two) times daily.    Dispense:  20 tablet    Refill:  0  . methylPREDNISolone sodium succinate (SOLU-MEDROL) 125 mg/2 mL injection 125 mg     I discussed the assessment and treatment plan with the patient. The patient was provided an opportunity to ask questions and all were answered. The patient agreed with the plan and demonstrated an understanding of the instructions.   The patient was advised to call back or seek an in-person evaluation if the symptoms worsen or if the condition fails to improve as anticipated.   [name], MD    [name], MD

## 2019-09-09 NOTE — Telephone Encounter (Signed)
Patient has a follow up today with PCP.

## 2019-09-10 ENCOUNTER — Encounter: Payer: Self-pay | Admitting: Family Medicine

## 2019-09-10 DIAGNOSIS — Z7982 Long term (current) use of aspirin: Secondary | ICD-10-CM | POA: Diagnosis not present

## 2019-09-10 DIAGNOSIS — O2693 Pregnancy related conditions, unspecified, third trimester: Secondary | ICD-10-CM | POA: Diagnosis not present

## 2019-09-10 DIAGNOSIS — Z79899 Other long term (current) drug therapy: Secondary | ICD-10-CM | POA: Diagnosis not present

## 2019-09-10 DIAGNOSIS — Z885 Allergy status to narcotic agent status: Secondary | ICD-10-CM | POA: Diagnosis not present

## 2019-09-10 DIAGNOSIS — Z91040 Latex allergy status: Secondary | ICD-10-CM | POA: Diagnosis not present

## 2019-09-10 DIAGNOSIS — I10 Essential (primary) hypertension: Secondary | ICD-10-CM | POA: Diagnosis not present

## 2019-09-10 DIAGNOSIS — J45909 Unspecified asthma, uncomplicated: Secondary | ICD-10-CM | POA: Diagnosis not present

## 2019-09-10 DIAGNOSIS — R0602 Shortness of breath: Secondary | ICD-10-CM | POA: Diagnosis not present

## 2019-09-10 DIAGNOSIS — Z886 Allergy status to analgesic agent status: Secondary | ICD-10-CM | POA: Diagnosis not present

## 2019-09-10 DIAGNOSIS — Z888 Allergy status to other drugs, medicaments and biological substances status: Secondary | ICD-10-CM | POA: Diagnosis not present

## 2019-09-10 DIAGNOSIS — J069 Acute upper respiratory infection, unspecified: Secondary | ICD-10-CM | POA: Diagnosis not present

## 2019-09-10 DIAGNOSIS — Z91048 Other nonmedicinal substance allergy status: Secondary | ICD-10-CM | POA: Diagnosis not present

## 2019-09-10 DIAGNOSIS — F329 Major depressive disorder, single episode, unspecified: Secondary | ICD-10-CM | POA: Diagnosis not present

## 2019-09-10 NOTE — Telephone Encounter (Signed)
I saw her she went to the ED.  Can you please call her and see if she is feeling any better or not.

## 2019-09-11 ENCOUNTER — Inpatient Hospital Stay (HOSPITAL_COMMUNITY): Payer: Medicaid Other

## 2019-09-11 ENCOUNTER — Inpatient Hospital Stay (HOSPITAL_COMMUNITY)
Admission: AD | Admit: 2019-09-11 | Discharge: 2019-09-19 | DRG: 806 | Disposition: A | Discharge status: Home / Self Care | Payer: Medicaid Other | Attending: Obstetrics and Gynecology | Admitting: Obstetrics and Gynecology

## 2019-09-11 ENCOUNTER — Telehealth: Payer: Self-pay | Admitting: Obstetrics and Gynecology

## 2019-09-11 ENCOUNTER — Other Ambulatory Visit: Payer: Self-pay

## 2019-09-11 ENCOUNTER — Encounter (HOSPITAL_COMMUNITY): Payer: Self-pay | Admitting: Obstetrics and Gynecology

## 2019-09-11 DIAGNOSIS — J811 Chronic pulmonary edema: Secondary | ICD-10-CM | POA: Diagnosis present

## 2019-09-11 DIAGNOSIS — E8770 Fluid overload, unspecified: Secondary | ICD-10-CM | POA: Diagnosis present

## 2019-09-11 DIAGNOSIS — Z3A28 28 weeks gestation of pregnancy: Secondary | ICD-10-CM

## 2019-09-11 DIAGNOSIS — O99344 Other mental disorders complicating childbirth: Secondary | ICD-10-CM | POA: Diagnosis present

## 2019-09-11 DIAGNOSIS — J189 Pneumonia, unspecified organism: Secondary | ICD-10-CM | POA: Diagnosis present

## 2019-09-11 DIAGNOSIS — F329 Major depressive disorder, single episode, unspecified: Secondary | ICD-10-CM | POA: Diagnosis present

## 2019-09-11 DIAGNOSIS — Z7982 Long term (current) use of aspirin: Secondary | ICD-10-CM | POA: Diagnosis not present

## 2019-09-11 DIAGNOSIS — J4551 Severe persistent asthma with (acute) exacerbation: Secondary | ICD-10-CM | POA: Diagnosis present

## 2019-09-11 DIAGNOSIS — R0602 Shortness of breath: Secondary | ICD-10-CM | POA: Diagnosis present

## 2019-09-11 DIAGNOSIS — O9081 Anemia of the puerperium: Secondary | ICD-10-CM | POA: Diagnosis not present

## 2019-09-11 DIAGNOSIS — F411 Generalized anxiety disorder: Secondary | ICD-10-CM | POA: Diagnosis present

## 2019-09-11 DIAGNOSIS — O99213 Obesity complicating pregnancy, third trimester: Secondary | ICD-10-CM | POA: Diagnosis not present

## 2019-09-11 DIAGNOSIS — O1414 Severe pre-eclampsia complicating childbirth: Principal | ICD-10-CM | POA: Diagnosis present

## 2019-09-11 DIAGNOSIS — J45909 Unspecified asthma, uncomplicated: Secondary | ICD-10-CM | POA: Diagnosis not present

## 2019-09-11 DIAGNOSIS — R0781 Pleurodynia: Secondary | ICD-10-CM | POA: Diagnosis not present

## 2019-09-11 DIAGNOSIS — I509 Heart failure, unspecified: Secondary | ICD-10-CM | POA: Diagnosis not present

## 2019-09-11 DIAGNOSIS — Z20822 Contact with and (suspected) exposure to covid-19: Secondary | ICD-10-CM | POA: Diagnosis present

## 2019-09-11 DIAGNOSIS — O1413 Severe pre-eclampsia, third trimester: Secondary | ICD-10-CM | POA: Diagnosis present

## 2019-09-11 DIAGNOSIS — Z3A29 29 weeks gestation of pregnancy: Secondary | ICD-10-CM | POA: Diagnosis not present

## 2019-09-11 DIAGNOSIS — O133 Gestational [pregnancy-induced] hypertension without significant proteinuria, third trimester: Secondary | ICD-10-CM | POA: Diagnosis not present

## 2019-09-11 DIAGNOSIS — Z982 Presence of cerebrospinal fluid drainage device: Secondary | ICD-10-CM

## 2019-09-11 DIAGNOSIS — O26893 Other specified pregnancy related conditions, third trimester: Secondary | ICD-10-CM | POA: Diagnosis present

## 2019-09-11 DIAGNOSIS — G932 Benign intracranial hypertension: Secondary | ICD-10-CM | POA: Diagnosis present

## 2019-09-11 DIAGNOSIS — O99891 Other specified diseases and conditions complicating pregnancy: Secondary | ICD-10-CM | POA: Diagnosis not present

## 2019-09-11 DIAGNOSIS — Z362 Encounter for other antenatal screening follow-up: Secondary | ICD-10-CM | POA: Diagnosis not present

## 2019-09-11 DIAGNOSIS — R079 Chest pain, unspecified: Secondary | ICD-10-CM | POA: Diagnosis present

## 2019-09-11 DIAGNOSIS — D62 Acute posthemorrhagic anemia: Secondary | ICD-10-CM | POA: Diagnosis not present

## 2019-09-11 DIAGNOSIS — O3413 Maternal care for benign tumor of corpus uteri, third trimester: Secondary | ICD-10-CM | POA: Diagnosis not present

## 2019-09-11 DIAGNOSIS — O1490 Unspecified pre-eclampsia, unspecified trimester: Secondary | ICD-10-CM

## 2019-09-11 DIAGNOSIS — K819 Cholecystitis, unspecified: Secondary | ICD-10-CM

## 2019-09-11 DIAGNOSIS — R030 Elevated blood-pressure reading, without diagnosis of hypertension: Secondary | ICD-10-CM | POA: Diagnosis present

## 2019-09-11 DIAGNOSIS — O99354 Diseases of the nervous system complicating childbirth: Secondary | ICD-10-CM | POA: Diagnosis present

## 2019-09-11 DIAGNOSIS — O99210 Obesity complicating pregnancy, unspecified trimester: Secondary | ICD-10-CM

## 2019-09-11 DIAGNOSIS — J810 Acute pulmonary edema: Secondary | ICD-10-CM | POA: Diagnosis not present

## 2019-09-11 DIAGNOSIS — Z3689 Encounter for other specified antenatal screening: Secondary | ICD-10-CM | POA: Diagnosis not present

## 2019-09-11 DIAGNOSIS — O9952 Diseases of the respiratory system complicating childbirth: Secondary | ICD-10-CM | POA: Diagnosis present

## 2019-09-11 DIAGNOSIS — O2693 Pregnancy related conditions, unspecified, third trimester: Secondary | ICD-10-CM | POA: Diagnosis not present

## 2019-09-11 DIAGNOSIS — O0990 Supervision of high risk pregnancy, unspecified, unspecified trimester: Secondary | ICD-10-CM

## 2019-09-11 LAB — COMPREHENSIVE METABOLIC PANEL
ALT: 18 U/L (ref 0–44)
AST: 20 U/L (ref 15–41)
Albumin: 2.4 g/dL — ABNORMAL LOW (ref 3.5–5.0)
Alkaline Phosphatase: 52 U/L (ref 38–126)
Anion gap: 10 (ref 5–15)
BUN: 21 mg/dL — ABNORMAL HIGH (ref 6–20)
CO2: 17 mmol/L — ABNORMAL LOW (ref 22–32)
Calcium: 8.4 mg/dL — ABNORMAL LOW (ref 8.9–10.3)
Chloride: 108 mmol/L (ref 98–111)
Creatinine, Ser: 0.75 mg/dL (ref 0.44–1.00)
GFR calc Af Amer: >60 mL/min (ref >60)
GFR calc non Af Amer: >60 mL/min (ref >60)
Glucose, Bld: 86 mg/dL (ref 70–99)
Potassium: 4.2 mmol/L (ref 3.5–5.1)
Sodium: 135 mmol/L (ref 135–145)
Total Bilirubin: 0.2 mg/dL — ABNORMAL LOW (ref 0.3–1.2)
Total Protein: 5.4 g/dL — ABNORMAL LOW (ref 6.5–8.1)

## 2019-09-11 LAB — URINALYSIS, ROUTINE W REFLEX MICROSCOPIC
Bilirubin Urine: NEGATIVE
Glucose, UA: 50 mg/dL — AB
Hgb urine dipstick: NEGATIVE
Ketones, ur: NEGATIVE mg/dL
Leukocytes,Ua: NEGATIVE
Nitrite: NEGATIVE
Protein, ur: >=300 mg/dL — AB
Specific Gravity, Urine: 1.033 — ABNORMAL HIGH (ref 1.005–1.030)
pH: 5 (ref 5.0–8.0)

## 2019-09-11 LAB — PROTEIN / CREATININE RATIO, URINE
Creatinine, Urine: 248.9 mg/dL
Protein Creatinine Ratio: 7.97 mg/mg{Cre} — ABNORMAL HIGH (ref 0.00–0.15)
Total Protein, Urine: 1984 mg/dL

## 2019-09-11 LAB — CBC
HCT: 27.4 % — ABNORMAL LOW (ref 36.0–46.0)
Hemoglobin: 8.8 g/dL — ABNORMAL LOW (ref 12.0–15.0)
MCH: 26.8 pg (ref 26.0–34.0)
MCHC: 32.1 g/dL (ref 30.0–36.0)
MCV: 83.5 fL (ref 80.0–100.0)
Platelets: 246 10*3/uL (ref 150–400)
RBC: 3.28 MIL/uL — ABNORMAL LOW (ref 3.87–5.11)
RDW: 12.8 % (ref 11.5–15.5)
WBC: 10.4 10*3/uL (ref 4.0–10.5)
nRBC: 0 % (ref 0.0–0.2)

## 2019-09-11 LAB — RESPIRATORY PANEL BY RT PCR (FLU A&B, COVID)
Influenza A by PCR: NEGATIVE
Influenza B by PCR: NEGATIVE
SARS Coronavirus 2 by RT PCR: NEGATIVE

## 2019-09-11 LAB — TYPE AND SCREEN
ABO/RH(D): A POS
Antibody Screen: NEGATIVE

## 2019-09-11 LAB — ABO/RH: ABO/RH(D): A POS

## 2019-09-11 MED ORDER — HYDRALAZINE HCL 20 MG/ML IJ SOLN
10.0000 mg | INTRAMUSCULAR | Status: DC | PRN
  Administered 2019-09-11: 23:00:00 10 mg via INTRAVENOUS
  Filled 2019-09-11: qty 1

## 2019-09-11 MED ORDER — LABETALOL HCL 5 MG/ML IV SOLN: 40.0000 mg | INTRAVENOUS | Status: DC | PRN

## 2019-09-11 MED ORDER — DOCUSATE SODIUM 100 MG PO CAPS
100.0000 mg | ORAL_CAPSULE | Freq: Every day | ORAL | Status: DC
  Administered 2019-09-12 – 2019-09-14 (×3): 100 mg via ORAL
  Filled 2019-09-11 (×3): qty 1

## 2019-09-11 MED ORDER — LABETALOL HCL 5 MG/ML IV SOLN: 20.0000 mg | INTRAVENOUS | Status: DC | PRN

## 2019-09-11 MED ORDER — MAGNESIUM SULFATE 40 GM/1000ML IV SOLN
2.0000 g/h | INTRAVENOUS | Status: DC
  Administered 2019-09-11 – 2019-09-12 (×2): 2 g/h via INTRAVENOUS
  Filled 2019-09-11: qty 1000

## 2019-09-11 MED ORDER — LABETALOL HCL 5 MG/ML IV SOLN
80.0000 mg | INTRAVENOUS | Status: DC | PRN
  Administered 2019-09-11: 21:00:00 80 mg via INTRAVENOUS

## 2019-09-11 MED ORDER — LABETALOL HCL 5 MG/ML IV SOLN
20.0000 mg | INTRAVENOUS | Status: DC | PRN
  Filled 2019-09-11: qty 4

## 2019-09-11 MED ORDER — LABETALOL HCL 5 MG/ML IV SOLN
INTRAVENOUS | Status: AC
  Administered 2019-09-11: 20:00:00 20 mg via INTRAVENOUS
  Filled 2019-09-11: qty 12

## 2019-09-11 MED ORDER — BUTALBITAL-APAP-CAFFEINE 50-325-40 MG PO TABS
1.0000 | ORAL_TABLET | Freq: Once | ORAL | Status: AC
  Administered 2019-09-11: 20:00:00 1 via ORAL
  Filled 2019-09-11: qty 1

## 2019-09-11 MED ORDER — HYDRALAZINE HCL 10 MG PO TABS
10.0000 mg | ORAL_TABLET | Freq: Once | ORAL | Status: DC
  Filled 2019-09-11 (×2): qty 1

## 2019-09-11 MED ORDER — LABETALOL HCL 5 MG/ML IV SOLN
80.0000 mg | INTRAVENOUS | Status: DC | PRN
  Filled 2019-09-11: qty 16

## 2019-09-11 MED ORDER — MAGNESIUM SULFATE BOLUS VIA INFUSION
4.0000 g | Freq: Once | INTRAVENOUS | Status: AC
  Administered 2019-09-11: 22:00:00 4 g via INTRAVENOUS
  Filled 2019-09-11: qty 1000

## 2019-09-11 MED ORDER — BETAMETHASONE SOD PHOS & ACET 6 (3-3) MG/ML IJ SUSP
12.0000 mg | INTRAMUSCULAR | Status: AC
  Administered 2019-09-11 – 2019-09-12 (×2): 12 mg via INTRAMUSCULAR
  Filled 2019-09-11 (×2): qty 5

## 2019-09-11 MED ORDER — PRENATAL MULTIVITAMIN CH
1.0000 | ORAL_TABLET | Freq: Every day | ORAL | Status: DC
  Administered 2019-09-12 – 2019-09-13 (×2): 1 via ORAL
  Filled 2019-09-11 (×2): qty 1

## 2019-09-11 MED ORDER — LABETALOL HCL 5 MG/ML IV SOLN
20.0000 mg | INTRAVENOUS | Status: DC | PRN
  Administered 2019-09-13: 20 mg via INTRAVENOUS

## 2019-09-11 MED ORDER — LABETALOL HCL 5 MG/ML IV SOLN: 80.0000 mg | INTRAVENOUS | Status: DC | PRN

## 2019-09-11 MED ORDER — HYDRALAZINE HCL 20 MG/ML IJ SOLN: 10.0000 mg | INTRAMUSCULAR | Status: DC | PRN

## 2019-09-11 MED ORDER — LABETALOL HCL 5 MG/ML IV SOLN
40.0000 mg | INTRAVENOUS | Status: DC | PRN
  Administered 2019-09-11: 21:00:00 40 mg via INTRAVENOUS

## 2019-09-11 MED ORDER — ESCITALOPRAM OXALATE 20 MG PO TABS
20.0000 mg | ORAL_TABLET | Freq: Every day | ORAL | Status: DC
  Administered 2019-09-12 – 2019-09-13 (×3): 20 mg via ORAL
  Filled 2019-09-11 (×2): qty 1
  Filled 2019-09-11 (×2): qty 2
  Filled 2019-09-11: qty 1

## 2019-09-11 MED ORDER — CALCIUM CARBONATE ANTACID 500 MG PO CHEW
2.0000 | CHEWABLE_TABLET | ORAL | Status: DC | PRN
  Administered 2019-09-12 – 2019-09-13 (×5): 400 mg via ORAL
  Filled 2019-09-11 (×5): qty 2

## 2019-09-11 NOTE — Telephone Encounter (Signed)
Received call from [employer] regarding elevated BP. Pt reports it is 120/96. She is not feeling well, dx with pneumonia by PCP and is on antibiotics. Reports slightly decreased fetal movement but baby still moving. Advised to continue kick counts, present to MAU for worsening issues. Keep appts next week. Answered all questions.   Feliz Beam, M.D. Attending Center for [employer] Fish farm manager)

## 2019-09-11 NOTE — H&P (Addendum)
Obstetric History and Physical  Maria Payne is a 29 y.o. G1P0000 with IUP at [redacted]w[redacted]d presenting with elevated BP and HA. HA is located temporal and occipital. Rates pain 7/10. States this HA feels different than her usual HA. Has not taken anything for it. Also reports CP and SOB. Feels like she is having difficulty breathing, shortness of breath with exertion is getting worse. Feels like chest pain is spreading across entire chest. Hx of congestion and non-productive cough x1 week, is being tested for Covid and treated for sinusitis and asthma exacerbation. Endorses blurry vision. Also reports decreased FM today. Denies fever or chills. Pt with significant hx of IHH and recent conversion of VP shunt to [place] shunt.  Prenatal Course Source of Care: [facility] with onset of care at 8 weeks Pregnancy complications or risks: Patient Active Problem List   Diagnosis Date Noted  . Severe preeclampsia, third trimester 09/11/2019  . Severe persistent asthma with exacerbation 09/09/2019  . Hypertension 08/14/2019  . Supervision of high risk pregnancy, antepartum 04/23/2019  . Obesity in pregnancy 04/23/2019  . Shunt malfunction 04/20/2017  . 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 Pain of upper abdomen 11/21/2015  . Psychic factors associated with diseases classified elsewhere 12/31/2013  . MDD (major depressive disorder) 04/04/2012  . GAD (generalized anxiety disorder) 04/04/2012  . 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 IIH (idiopathic intracranial hypertension) 09/10/2011  . Papilledema associated with increased intracranial pressure 08/20/2011  . DERMATITIS, PERIORAL 05/17/2010   Prenatal labs and studies: ABO, Rh: --/--/A POS, A POS Performed at [facility], [place] [address], [place], [place] [address]  [phone] 1950) Antibody: NEG (01[birth year]01 1951) Rubella: 6.61 (08/13 1430) RPR: NON-REACTIVE (08/13 1430)  HBsAg: NON-REACTIVE (08/13 1430)  HIV: NON-REACTIVE (08/13 1430)    GBS:  2 hr GTT:  Not done Genetic screening normal Anatomy US normal  Medical History:  Past Medical History:  Diagnosis Date  . Asthma   . 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 Had diagnostic cerebral arteriogram.     . Depression   . GAD (generalized anxiety disorder) 04/04/2012  . Papilledema    Diamox  . Pseudotumor cerebri     Past Surgical History:  Procedure Laterality Date  . ANGIOPLASTY  2012   ? cerebral artery   . CHOLECYSTECTOMY    . SHUNT REVISION     4 times, last time 07/24/19  . SLEEVE GASTROPLASTY  05/2014   Dr. Evorn Gong   . Stent placed  August 2014   Stenosis right transverse sigmoid junction,[facility]  . VENTRICULAR ATRIAL SHUNT    . VP shunt  11/2013   Done at [facility]    OB History  Gravida Para Term Preterm AB Living  1 0 0 0 0 0  SAB TAB Ectopic Multiple Live Births  0 0 0 0 0    # Outcome Date GA Lbr Len/2nd Weight Sex Delivery Anes PTL Lv  1 Current             Social History   Socioeconomic History  . Marital status: Married    Spouse name: Not on file  . Number of children: 0  . Years of education: Not on file  . Highest education level: Not on file  Occupational History  . Occupation: unemployed.   . Occupation: CUSTOMER SERVICE    Employer: [employer]  Tobacco Use  . Smoking status: Never Smoker  . Smokeless tobacco: Never Used  Substance  and Sexual Activity  . Alcohol use: Not Currently    Alcohol/week: 0.0 standard drinks    Comment: occ  . Drug use: No  . Sexual activity: Yes    Partners: Male    Birth control/protection: None  Other Topics Concern  . Not on file  Social History Narrative   seperated from husband in nov 2014.  Still legally married.    Social Determinants of Health   Financial Resource Strain:   . Difficulty of Paying Living Expenses: Not on file  Food Insecurity:   . Worried About Charity fundraiser in the Last Year: Not on file  . Ran Out of Food in the Last Year:  Not on file  Transportation Needs:   . Lack of Transportation (Medical): Not on file  . Lack of Transportation (Non-Medical): Not on file  Physical Activity:   . Days of Exercise per Week: Not on file  . Minutes of Exercise per Session: Not on file  Stress:   . Feeling of Stress : Not on file  Social Connections:   . Frequency of Communication with Friends and Family: Not on file  . Frequency of Social Gatherings with Friends and Family: Not on file  . Attends Religious Services: Not on file  . Active Member of Clubs or Organizations: Not on file  . Attends Archivist Meetings: Not on file  . Marital Status: Not on file    Family History  Problem Relation Age of Onset  . Hypertension Father   . Heart disease Father   . Diabetes Father   . Diabetes Mother     Medications Prior to Admission  Medication Sig Dispense Refill Last Dose  . acetaminophen (TYLENOL) 500 MG tablet Take 1,000 mg by mouth 2 (two) times daily.   09/11/2019 at 0900  . amoxicillin-clavulanate (AUGMENTIN) 875-125 MG tablet Take 1 tablet by mouth 2 (two) times daily. 20 tablet 0 09/11/2019 at Unknown time  . aspirin 81 MG chewable tablet Chew 1 tablet (81 mg total) by mouth daily. 90 tablet 3 09/10/2019 at Unknown time  . escitalopram (LEXAPRO) 20 MG tablet Take 1 tablet (20 mg total) by mouth daily. 30 tablet 6 09/10/2019 at Unknown time  . predniSONE (DELTASONE) 20 MG tablet Take 2 tablets (40 mg total) by mouth daily with breakfast. 10 tablet 0 09/11/2019 at Unknown time  . Prenatal Vit-Fe Fumarate-FA (MULTIVITAMIN-PRENATAL) 27-0.8 MG TABS tablet Take 1 tablet by mouth daily at 12 noon.   09/11/2019 at Unknown time    Allergies  Allergen Reactions  . Latex     Other reaction(s): Other Diagnosed with blood test  . Nsaids     Other reaction(s): Other contraindication  . Codeine   . Hydrocodone-Acetaminophen Rash    Headaches Morphine, Dilaudid OK  . Oxycodone Rash    Headaches  . Tape Rash     welps    Review of Systems: Negative except for what is mentioned in HPI.  Physical Exam: BP (!) 171/95   Pulse 93   Temp 97.9 F (36.6 C) (Oral)   Resp (!) 21 Comment: raised HOB and listened to lungs, pt states pain increasing  Wt 132.9 kg   LMP 02/23/2019 (Exact Date)   SpO2 99%   BMI 44.55 kg/m  CONSTITUTIONAL: Well-developed, well-nourished female in mild distress.  HENT:  Normocephalic, atraumatic, External right and left ear normal. Oropharynx is clear and moist EYES: Conjunctivae and EOM are normal. Pupils are equal, round, and reactive to  light. No scleral icterus.  NECK: Normal range of motion, supple, no masses SKIN: Skin is warm and dry. No rash noted. Not diaphoretic. No erythema. No pallor. NEUROLOGIC: Alert and oriented to person, place, and time. Normal reflexes, muscle tone coordination. No cranial nerve deficit noted. PSYCHIATRIC: Normal mood and affect. Normal behavior. Normal judgment and thought content. CARDIOVASCULAR: Normal heart rate noted, regular rhythm RESPIRATORY: Effort and breath sounds normal, no problems with respiration noted ABDOMEN: Soft, nondistended, gravid. MUSCULOSKELETAL: Normal range of motion. No edema and no tenderness. 2+ distal pulses.  FHT:  Baseline rate 145 bpm   Variability moderate  Accelerations present   Decelerations none Contractions: none   Pertinent Labs/Studies:   Results for orders placed or performed during the hospital encounter of 09/11/19 (from the past 24 hour(s))  Urinalysis, Routine w reflex microscopic     Status: Abnormal   Collection Time: 09/11/19  6:40 PM  Result Value Ref Range   Color, Urine YELLOW YELLOW   APPearance HAZY (A) CLEAR   Specific Gravity, Urine 1.033 (H) 1.005 - 1.030   pH 5.0 5.0 - 8.0   Glucose, UA 50 (A) NEGATIVE mg/dL   Hgb urine dipstick NEGATIVE NEGATIVE   Bilirubin Urine NEGATIVE NEGATIVE   Ketones, ur NEGATIVE NEGATIVE mg/dL   Protein, ur >=300 (A) NEGATIVE mg/dL   Nitrite  NEGATIVE NEGATIVE   Leukocytes,Ua NEGATIVE NEGATIVE   RBC / HPF 0-5 0 - 5 RBC/hpf   WBC, UA 6-10 0 - 5 WBC/hpf   Bacteria, UA FEW (A) NONE SEEN   Squamous Epithelial / LPF 6-10 0 - 5   Mucus PRESENT    Hyaline Casts, UA PRESENT   Protein / creatinine ratio, urine     Status: Abnormal   Collection Time: 09/11/19  6:40 PM  Result Value Ref Range   Creatinine, Urine 248.90 mg/dL   Total Protein, Urine 1,984 mg/dL   Protein Creatinine Ratio 7.97 (H) 0.00 - 0.15 mg/mg[Cre]  CBC     Status: Abnormal   Collection Time: 09/11/19  7:50 PM  Result Value Ref Range   WBC 10.4 4.0 - 10.5 K/uL   RBC 3.28 (L) 3.87 - 5.11 MIL/uL   Hemoglobin 8.8 (L) 12.0 - 15.0 g/dL   HCT 27.4 (L) 36.0 - 46.0 %   MCV 83.5 80.0 - 100.0 fL   MCH 26.8 26.0 - 34.0 pg   MCHC 32.1 30.0 - 36.0 g/dL   RDW 12.8 11.5 - 15.5 %   Platelets 246 150 - 400 K/uL   nRBC 0.0 0.0 - 0.2 %  Comprehensive metabolic panel     Status: Abnormal   Collection Time: 09/11/19  7:50 PM  Result Value Ref Range   Sodium 135 135 - 145 mmol/L   Potassium 4.2 3.5 - 5.1 mmol/L   Chloride 108 98 - 111 mmol/L   CO2 17 (L) 22 - 32 mmol/L   Glucose, Bld 86 70 - 99 mg/dL   BUN 21 (H) 6 - 20 mg/dL   Creatinine, Ser 0.75 0.44 - 1.00 mg/dL   Calcium 8.4 (L) 8.9 - 10.3 mg/dL   Total Protein 5.4 (L) 6.5 - 8.1 g/dL   Albumin 2.4 (L) 3.5 - 5.0 g/dL   AST 20 15 - 41 U/L   ALT 18 0 - 44 U/L   Alkaline Phosphatase 52 38 - 126 U/L   Total Bilirubin 0.2 (L) 0.3 - 1.2 mg/dL   GFR calc non Af Amer >60 >60 mL/min   GFR calc  Af Amer >60 >60 mL/min   Anion gap 10 5 - 15  Type and screen [place]     Status: None   Collection Time: 09/11/19  7:50 PM  Result Value Ref Range   ABO/RH(D) A POS    Antibody Screen NEG    Sample Expiration      09/14/2019,2359 Performed at [facility], [place] [address], [place], [place] [address]   ABO/Rh     Status: None   Collection Time: 09/11/19  7:50 PM  Result Value Ref Range   ABO/RH(D)        A POS Performed at [place] [address], [place], [place] [address]   Respiratory Panel by RT PCR (Flu A&B, Covid) - Nasopharyngeal Swab     Status: None   Collection Time: 09/11/19  9:02 PM   Specimen: Nasopharyngeal Swab  Result Value Ref Range   SARS Coronavirus 2 by RT PCR NEGATIVE NEGATIVE   Influenza A by PCR NEGATIVE NEGATIVE   Influenza B by PCR NEGATIVE NEGATIVE   CLINICAL DATA:  Chest pain, possible COVID  EXAM: CHEST - 2 VIEW  COMPARISON:  08/06/2018  FINDINGS: The heart size and mediastinal contours are within normal limits. Subtle heterogeneous airspace opacity of the bilateral lung bases. The visualized skeletal structures are unremarkable. Shunt catheter tubing projects over the right neck and chest.  IMPRESSION: Subtle heterogeneous airspace opacity of the bilateral lung bases, suspicious for infection and particularly COVID-19 if suspected.   Electronically Signed   By: Eddie Candle M.D.   On: 09/11/2019 20:48   Assessment : Maria Payne is a 29 y.o. G1P0000 at [redacted]w[redacted]d being admitted for pre-eclampsia with severe features. Also with pneumonia/asthma exacerbation and has been on solumedrol prescribed by PCP. Although she is reporting worsening of her chest pain, she continues to sat 98-99% on room air. Reviewed PEC diagnosis, etiology, implications for delivery and plan of care.   *Questionable history of chronic hypertension, one elevated BP prior to pregnancy last year but pt denies any history of this.  *H/o Idiopathic Intracranial Hypertension with ventriculoperitoneal shunt converted to ventricular atrial shunt in 07/2019 at [facility].   Plan:  PEC w SF (based on BP and UCPr) - Admit to OBSC - Magnesium sulfate - IV labetalol per protocol for BP management - start PO labetalol 200 mg BID - BTMZ x2 - will review with MFM  FWB  - Cat I fetal heart tracing   - GBS unknown - BTMZ - NICU consult  Pneumonia/chest pain -  abnormal CXR - COVID/FluA/B negative - O2 sats > 98% on RA - hospitalist consult  IIH with [facility] shunt - per hospitalist    Feliz Beam, M.D. Attending Center for [employer] (Faculty Practice)  09/12/2019, 12:35 AM

## 2019-09-11 NOTE — MAU Note (Signed)
Patient states she was diagnosed with Pneumonia and a sinus infection a few days ago and has a covid test pending.  She was started on steroids and antibiotics.  Today she is c/o increased swelling, HA, and high blood pressure (states it was 153/97 at [facility] urgent care yesterday.)  Has some congestion and SOB, but denies fever, chills, cough, sore throat, or loss of taste or smell.  No one at home is sick.  Also c/o DFM.  Felt movement x1 today.

## 2019-09-11 NOTE — Progress Notes (Signed)
Pt w/ history of intracranial idiopathic hypertension, history of VP shunt redone to ventricular atrial shunt at [facility] in 07/2019 due to abdominal pain in pregnancy. Pt here with severe range BP @ 28 weeks, spoke with neurology and cardiology who agree no contraindications to any anti-hypertensives to bring BP into normal range.   Feliz Beam, M.D. Attending Center for [employer] Fish farm manager)

## 2019-09-11 NOTE — MAU Provider Note (Signed)
History     CSN: [id number]  Arrival date and time: 09/11/19 1753  First Provider Initiated Contact with Patient 09/11/19 1856     Chief Complaint  Patient presents with  . Headache  . Hypertension   29 y.o. G1 @28 .4 wks presenting with elevated BP and HA. HA is located temporal and occipital. Rates pain 7/10. States this HA feels different than her usual HA. Has not taken anything for it. Also reports CP and SOB. Hx of congestion and non-productive cough x1 week, is being tested for Covid and treated for sinusitis and asthma exacerbation. Endorses blurry vision. Also reports decreased FM today. Denies fever or chills. Pt with significant hx of IHH and recent conversion   OB History    Gravida  1   Para  0   Term  0   Preterm  0   AB  0   Living  0     SAB  0   TAB  0   Ectopic  0   Multiple  0   Live Births  0           Past Medical History:  Diagnosis Date  . Asthma   . 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 Had diagnostic cerebral arteriogram.     . Depression   . GAD (generalized anxiety disorder) 04/04/2012  . Papilledema    Diamox  . Pseudotumor cerebri     Past Surgical History:  Procedure Laterality Date  . ANGIOPLASTY  2012   ? cerebral artery   . CHOLECYSTECTOMY    . SHUNT REVISION     4 times, last time 07/24/19  . SLEEVE GASTROPLASTY  05/2014   Dr. Evorn Gong   . Stent placed  August 2014   Stenosis right transverse sigmoid junction,[facility]  . VENTRICULAR ATRIAL SHUNT    . VP shunt  11/2013   Done at [facility]    Family History  Problem Relation Age of Onset  . Hypertension Father   . Heart disease Father   . Diabetes Father   . Diabetes Mother     Social History   Tobacco Use  . Smoking status: Never Smoker  . Smokeless tobacco: Never Used  Substance Use Topics  . Alcohol use: Not Currently    Alcohol/week: 0.0 standard drinks    Comment: occ  . Drug use: No    Allergies:  Allergies   Allergen Reactions  . Latex     Other reaction(s): Other Diagnosed with blood test  . Nsaids     Other reaction(s): Other contraindication  . Codeine   . Hydrocodone-Acetaminophen Rash    Headaches Morphine, Dilaudid OK  . Oxycodone Rash    Headaches  . Tape Rash    welps    Medications Prior to Admission  Medication Sig Dispense Refill Last Dose  . acetaminophen (TYLENOL) 500 MG tablet Take 1,000 mg by mouth 2 (two) times daily.   09/11/2019 at 0900  . amoxicillin-clavulanate (AUGMENTIN) 875-125 MG tablet Take 1 tablet by mouth 2 (two) times daily. 20 tablet 0 09/11/2019 at Unknown time  . aspirin 81 MG chewable tablet Chew 1 tablet (81 mg total) by mouth daily. 90 tablet 3 09/10/2019 at Unknown time  . escitalopram (LEXAPRO) 20 MG tablet Take 1 tablet (20 mg total) by mouth daily. 30 tablet 6 09/10/2019 at Unknown time  . predniSONE (DELTASONE) 20 MG tablet Take 2 tablets (40 mg total) by mouth daily with breakfast. 10  tablet 0 09/11/2019 at Unknown time  . Prenatal Vit-Fe Fumarate-FA (MULTIVITAMIN-PRENATAL) 27-0.8 MG TABS tablet Take 1 tablet by mouth daily at 12 noon.   09/11/2019 at Unknown time    Review of Systems  Constitutional: Negative for chills and fever.  HENT: Positive for congestion.   Eyes: Positive for visual disturbance.  Respiratory: Positive for cough and shortness of breath.   Cardiovascular: Positive for chest pain.  Gastrointestinal: Negative for abdominal pain.  Genitourinary: Negative for vaginal bleeding and vaginal discharge.  Neurological: Positive for headaches.   Physical Exam   Blood pressure (!) 165/87, pulse 86, weight 132.9 kg, last menstrual period 02/23/2019, SpO2 99 %. Patient Vitals for the past 24 hrs:  BP Pulse SpO2 Weight  09/11/19 2116 (!) 165/87 86 - -  09/11/19 2053 (!) 160/84 71 - -  09/11/19 2017 (!) 151/80 74 99 % -  09/11/19 2001 (!) 150/81 77 - -  09/11/19 2000 - - 99 % -  09/11/19 1952 (!) 157/78 76 - -  09/11/19 1931 (!)  147/74 71 - -  09/11/19 1930 - - 98 % -  09/11/19 1916 (!) 162/86 76 - -  09/11/19 1846 (!) 167/92 84 99 % -  09/11/19 1830 (!) 174/92 83 98 % -  09/11/19 1811 - - - 132.9 kg   Physical Exam  Nursing note and vitals reviewed. Constitutional: She is oriented to person, place, and time. She appears well-developed and well-nourished. No distress.  HENT:  Head: Normocephalic and atraumatic.  Cardiovascular: Normal rate, regular rhythm and normal heart sounds.  Respiratory: Effort normal and breath sounds normal. No respiratory distress. She has no wheezes. She has no rales.  GI: Soft. She exhibits no distension. There is no abdominal tenderness.  gravid  Musculoskeletal:        General: Normal range of motion.     Cervical back: Normal range of motion.  Neurological: She is alert and oriented to person, place, and time.  Skin: Skin is warm and dry.  Psychiatric: She has a normal mood and affect.  EFM: 150 bpm, mod variability, + accels, no decels Toco: none  Results for orders placed or performed during the hospital encounter of 09/11/19 (from the past 24 hour(s))  Urinalysis, Routine w reflex microscopic     Status: Abnormal   Collection Time: 09/11/19  6:40 PM  Result Value Ref Range   Color, Urine YELLOW YELLOW   APPearance HAZY (A) CLEAR   Specific Gravity, Urine 1.033 (H) 1.005 - 1.030   pH 5.0 5.0 - 8.0   Glucose, UA 50 (A) NEGATIVE mg/dL   Hgb urine dipstick NEGATIVE NEGATIVE   Bilirubin Urine NEGATIVE NEGATIVE   Ketones, ur NEGATIVE NEGATIVE mg/dL   Protein, ur >=300 (A) NEGATIVE mg/dL   Nitrite NEGATIVE NEGATIVE   Leukocytes,Ua NEGATIVE NEGATIVE   RBC / HPF 0-5 0 - 5 RBC/hpf   WBC, UA 6-10 0 - 5 WBC/hpf   Bacteria, UA FEW (A) NONE SEEN   Squamous Epithelial / LPF 6-10 0 - 5   Mucus PRESENT    Hyaline Casts, UA PRESENT   CBC     Status: Abnormal   Collection Time: 09/11/19  7:50 PM  Result Value Ref Range   WBC 10.4 4.0 - 10.5 K/uL   RBC 3.28 (L) 3.87 - 5.11  MIL/uL   Hemoglobin 8.8 (L) 12.0 - 15.0 g/dL   HCT 27.4 (L) 36.0 - 46.0 %   MCV 83.5 80.0 - 100.0 fL  MCH 26.8 26.0 - 34.0 pg   MCHC 32.1 30.0 - 36.0 g/dL   RDW 12.8 11.5 - 15.5 %   Platelets 246 150 - 400 K/uL   nRBC 0.0 0.0 - 0.2 %  Comprehensive metabolic panel     Status: Abnormal   Collection Time: 09/11/19  7:50 PM  Result Value Ref Range   Sodium 135 135 - 145 mmol/L   Potassium 4.2 3.5 - 5.1 mmol/L   Chloride 108 98 - 111 mmol/L   CO2 17 (L) 22 - 32 mmol/L   Glucose, Bld 86 70 - 99 mg/dL   BUN 21 (H) 6 - 20 mg/dL   Creatinine, Ser 0.75 0.44 - 1.00 mg/dL   Calcium 8.4 (L) 8.9 - 10.3 mg/dL   Total Protein 5.4 (L) 6.5 - 8.1 g/dL   Albumin 2.4 (L) 3.5 - 5.0 g/dL   AST 20 15 - 41 U/L   ALT 18 0 - 44 U/L   Alkaline Phosphatase 52 38 - 126 U/L   Total Bilirubin 0.2 (L) 0.3 - 1.2 mg/dL   GFR calc non Af Amer >60 >60 mL/min   GFR calc Af Amer >60 >60 mL/min   Anion gap 10 5 - 15  Type and screen [place]     Status: None (Preliminary result)   Collection Time: 09/11/19  7:50 PM  Result Value Ref Range   ABO/RH(D) PENDING    Antibody Screen PENDING    Sample Expiration      09/14/2019,2359 Performed at [facility], [address] [place], [place] [address]    DG Chest 2 View  Result Date: 09/11/2019 CLINICAL DATA:  Chest pain, possible COVID EXAM: CHEST - 2 VIEW COMPARISON:  08/06/2018 FINDINGS: The heart size and mediastinal contours are within normal limits. Subtle heterogeneous airspace opacity of the bilateral lung bases. The visualized skeletal structures are unremarkable. Shunt catheter tubing projects over the right neck and chest. IMPRESSION: Subtle heterogeneous airspace opacity of the bilateral lung bases, suspicious for infection and particularly COVID-19 if suspected. Electronically Signed   By: Eddie Candle M.D.   On: 09/11/2019 20:48   MAU Course  Procedures Meds ordered this encounter  Medications  .  butalbital-acetaminophen-caffeine (FIORICET) 50-325-40 MG per tablet 1 tablet  . DISCONTD: labetalol (NORMODYNE) injection 20 mg  . DISCONTD: labetalol (NORMODYNE) injection 40 mg  . DISCONTD: labetalol (NORMODYNE) injection 80 mg  . DISCONTD: hydrALAZINE (APRESOLINE) injection 10 mg  . AND Linked Order Group   . labetalol (NORMODYNE) injection 20 mg   . labetalol (NORMODYNE) injection 40 mg   . labetalol (NORMODYNE) injection 80 mg   . hydrALAZINE (APRESOLINE) tablet 10 mg  . labetalol (NORMODYNE) 5 MG/ML injection    Cioce, Anna   : cabinet override  . escitalopram (LEXAPRO) tablet 20 mg  . multivitamin-prenatal tablet 1 tablet  . docusate sodium (COLACE) capsule 100 mg  . calcium carbonate (TUMS - dosed in mg elemental calcium) chewable tablet 400 mg of elemental calcium  . magnesium bolus via infusion 4 g  . magnesium sulfate 40 grams in SWI 1000 mL OB infusion  . betamethasone acetate-betamethasone sodium phosphate (CELESTONE) injection 12 mg  . AND Linked Order Group   . labetalol (NORMODYNE) injection 20 mg   . labetalol (NORMODYNE) injection 40 mg   . labetalol (NORMODYNE) injection 80 mg   . hydrALAZINE (APRESOLINE) injection 10 mg   MDM Labs and CXR ordered and reviewed. Consult with Dr. Rosana Hoes, plan for  admit. [facility] notified of need for consult. NICU open per Dr. Barbaraann Rondo. Rapid Covid pending.  Assessment and Plan  [redacted] weeks gestation Preeclampsia with severe features Pneumonia Admit to [facility] unit [facility] per Dr. Maurine Simmering, CNM 09/11/2019, 9:29 PM

## 2019-09-11 NOTE — L&D Delivery Note (Addendum)
OB/GYN Faculty Practice Delivery Note  Maria Payne is a 29 y.o. G1P0000 s/p VD at [redacted]w[redacted]d. She was admitted for IOL for [name].   ROM: 9h 11m with clear fluid GBS Status: Unknown; received adequate PCN ppx   Maximum Maternal Temperature: 99.1 F  Labor Progress: . Initial SVE: closed/thick/high. Patient received Cytotec, Foley bulb, AROM and Pitocin. Received epidural. She then progressed to complete.   Delivery Date/Time: 1/5 @ 1919 Delivery: Martin Majestic to assess due to deep variables and patient found to be complete; instructed to start pushing. Head delivered OA. No nuchal cord present. Shoulder and body delivered in usual fashion. Infant briefly placed on maternal abdomen and cord clamped and cut by resident. Cord blood drawn. Arterial gas drawn. Placenta delivered spontaneously with gentle cord traction. Fundus firm with massage and Pitocin. Labia, perineum, vagina, and cervix inspected inspected with small first degree vaginal laceration and right labial laceration which were repaired with 3-0 Vicryl in a standard fashion.  Baby Weight: 1110g  Placenta: Sent to pathology Complications: None Lacerations: small first degree vaginal laceration and right labial laceration EBL: 401 mL Analgesia: Epidural   Infant: APGAR (1 MIN): 2   APGAR (5 MINS): 8   APGAR (10 MINS):     Addendum: Notified by RN that EBL now to 500 mL. Reassessed patient. Fundus firm with minimal bleeding. TXA and rectal Cytotec given.   Barrington Ellison, MD [facility] Family Medicine Fellow, [facility] [place] [facility] 09/15/2019, 8:36 PM

## 2019-09-12 ENCOUNTER — Inpatient Hospital Stay (HOSPITAL_COMMUNITY): Payer: Medicaid Other

## 2019-09-12 ENCOUNTER — Encounter (HOSPITAL_COMMUNITY): Payer: Self-pay | Admitting: Obstetrics and Gynecology

## 2019-09-12 DIAGNOSIS — J810 Acute pulmonary edema: Secondary | ICD-10-CM

## 2019-09-12 DIAGNOSIS — Z982 Presence of cerebrospinal fluid drainage device: Secondary | ICD-10-CM

## 2019-09-12 DIAGNOSIS — O1413 Severe pre-eclampsia, third trimester: Secondary | ICD-10-CM

## 2019-09-12 DIAGNOSIS — J189 Pneumonia, unspecified organism: Secondary | ICD-10-CM

## 2019-09-12 DIAGNOSIS — R0781 Pleurodynia: Secondary | ICD-10-CM

## 2019-09-12 DIAGNOSIS — G932 Benign intracranial hypertension: Secondary | ICD-10-CM

## 2019-09-12 LAB — TROPONIN I (HIGH SENSITIVITY)
Troponin I (High Sensitivity): 19 ng/L — ABNORMAL HIGH (ref <18)
Troponin I (High Sensitivity): 15 ng/L (ref <18)

## 2019-09-12 LAB — PROCALCITONIN: Procalcitonin: <0.1 ng/mL

## 2019-09-12 MED ORDER — BUTALBITAL-APAP-CAFFEINE 50-325-40 MG PO TABS
1.0000 | ORAL_TABLET | Freq: Four times a day (QID) | ORAL | Status: DC | PRN
  Administered 2019-09-12 – 2019-09-13 (×3): 1 via ORAL
  Filled 2019-09-12 (×3): qty 1

## 2019-09-12 MED ORDER — LABETALOL HCL 200 MG PO TABS
200.0000 mg | ORAL_TABLET | Freq: Two times a day (BID) | ORAL | Status: DC
  Administered 2019-09-12 (×3): 200 mg via ORAL
  Filled 2019-09-12 (×3): qty 1

## 2019-09-12 MED ORDER — ACETAMINOPHEN 325 MG PO TABS
650.0000 mg | ORAL_TABLET | Freq: Four times a day (QID) | ORAL | Status: DC | PRN
  Administered 2019-09-12 – 2019-09-14 (×5): 650 mg via ORAL
  Filled 2019-09-12 (×5): qty 2

## 2019-09-12 MED ORDER — LACTATED RINGERS IV SOLN: INTRAVENOUS | Status: DC

## 2019-09-12 MED ORDER — ONDANSETRON HCL 4 MG/2ML IJ SOLN
4.0000 mg | Freq: Four times a day (QID) | INTRAMUSCULAR | Status: DC | PRN
  Administered 2019-09-12 – 2019-09-13 (×6): 4 mg via INTRAVENOUS
  Filled 2019-09-12 (×6): qty 2

## 2019-09-12 MED ORDER — IOHEXOL 350 MG/ML SOLN
100.0000 mL | Freq: Once | INTRAVENOUS | Status: AC | PRN
  Administered 2019-09-12: 03:00:00 100 mL via INTRAVENOUS

## 2019-09-12 NOTE — Consult Note (Signed)
[facility] Medical Consultation  Maria Payne [id number] DOB: 1991/07/21 DOA: 09/11/2019 PCP: Hali Marry, MD   Requesting physician: Dr. Rosana Hoes. Date of consultation: September 12, 2019. Reason for consultation: Possible Covid infection.  Impression/Recommendations Active Problems:   IIH (idiopathic intracranial hypertension)   S/P ventriculoperitoneal shunt   Severe preeclampsia, third trimester   Pneumonia    1. Pleuritic type of chest pain with abnormal chest x-ray concerning for pneumonia -patient's main complaint at this time is pleuritic type of chest pain and mainly in the upper back over the last 2 to 3 days.  Pain increases on exertion become short of breath.  No definite signs of pneumonia.  Patient is afebrile and not hypoxic and lab work show no leukocytosis.  Given the symptoms I discussed with Dr. Rosana Hoes OB/GYN and will get a CT angiogram of the chest to rule out pulmonary embolism.  Based on the CT angiogram we will have further plans in the meantime we will also get a procalcitonin and troponin levels.  2. Preeclampsia being managed by her OB/GYN. 3. Normocytic normochromic anemia as per OB/GYN. 4. Idiopathic intracranial hypertension status post VA shunt which was recently changed from VP shunt by neurosurgery.  Also has intracranial angioplasty done previously on aspirin.  I will followup again tomorrow. Please contact me if I can be of assistance in the meanwhile. Thank you for this consultation.  Chief Complaint: Headache chest pain increasing peripheral edema.  HPI:  With history of idiopathic intracranial hypertension status post VA shunt which was recently converted from VP shunt in November 2020 after patient had increasing abdominal discomfort after the pregnancy also has intracranial angioplasty done and is presently on aspirin with history of asthma over the last 10 days has been having some sinus symptoms and last 2 days has been having  increasing pleuritic type of chest pain mostly in the upper back and across anterior chest.  Symptoms worsen on walking.  Patient also noticed increasing headache and bilateral lower extremity edema and edema of the upper extremities.  Patient was checking her blood pressure at home which was elevated and presented to the ER.  3 days ago she had gone to her primary care physician who placed her on Decadron and Zithromax and had Covid test done which pills are pending.  Chest x-ray done in this hospital today was showing bilateral infiltrates concerning for viral pneumonia.  COVID-19 test PCR done was negative over ER.   On my exam patient is afebrile not hypoxic.  Labs revealed bicarb of 17 creatinine 0.7 hemoglobin 8.8 platelets 246.  EKG shows normal sinus rhythm nonspecific T wave changes.  Patient blood pressure was elevated and has been managed by OB/GYN for preeclampsia given that patient is [redacted] weeks pregnant.  Review of Systems:  As mentioned in the history of present illness and nothing on significant.  Past Medical History:  Diagnosis Date  . Asthma   . 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 Had diagnostic cerebral arteriogram.     . Depression   . GAD (generalized anxiety disorder) 04/04/2012  . Papilledema    Diamox  . Pseudotumor cerebri    Past Surgical History:  Procedure Laterality Date  . ANGIOPLASTY  2012   ? cerebral artery   . CHOLECYSTECTOMY    . SHUNT REVISION     4 times, last time 07/24/19  . SLEEVE GASTROPLASTY  05/2014   Dr. Evorn Gong   . Stent placed  August 2014   Stenosis right transverse sigmoid junction,[facility]  . VENTRICULAR ATRIAL SHUNT    . VP shunt  11/2013   Done at [place] History:  reports that she has never smoked. She has never used smokeless tobacco. She reports previous alcohol use. She reports that she does not use drugs.  Allergies  Allergen Reactions  . Latex     Other reaction(s): Other Diagnosed  with blood test  . Nsaids     Other reaction(s): Other contraindication  . Codeine   . Hydrocodone-Acetaminophen Rash    Headaches Morphine, Dilaudid OK  . Oxycodone Rash    Headaches  . Tape Rash    welps   Family History  Problem Relation Age of Onset  . Hypertension Father   . Heart disease Father   . Diabetes Father   . Diabetes Mother     Prior to Admission medications   Medication Sig Start Date End Date Taking? Authorizing Provider  acetaminophen (TYLENOL) 500 MG tablet Take 1,000 mg by mouth 2 (two) times daily.   Yes [provider]  amoxicillin-clavulanate (AUGMENTIN) 875-125 MG tablet Take 1 tablet by mouth 2 (two) times daily. 09/09/19  Yes Hali Marry, MD  aspirin 81 MG chewable tablet Chew 1 tablet (81 mg total) by mouth daily. 04/23/19  Yes Dove, Myra C, MD  escitalopram (LEXAPRO) 20 MG tablet Take 1 tablet (20 mg total) by mouth daily. 07/21/19  Yes Dove, Myra C, MD  predniSONE (DELTASONE) 20 MG tablet Take 2 tablets (40 mg total) by mouth daily with breakfast. 09/09/19  Yes Hali Marry, MD  Prenatal Vit-Fe Fumarate-FA (MULTIVITAMIN-PRENATAL) 27-0.8 MG TABS tablet Take 1 tablet by mouth daily at 12 noon.   Yes [provider]   Physical Exam: Blood pressure (!) 156/92, pulse 89, temperature 97.9 F (36.6 C), temperature source Oral, resp. rate (!) 21, weight 132.9 kg, last menstrual period 02/23/2019, SpO2 99 %. Vitals:   09/12/19 0001 09/12/19 0100  BP: (!) 171/95 (!) 156/92  Pulse: 93 89  Resp:    Temp: 97.9 F (36.6 C) 97.9 F (36.6 C)  SpO2:  99%     General: Moderately built and nourished.  Eyes: Anicteric no pallor.  ENT: No discharge from the ears eyes nose or mouth.  Neck: No mass felt.  No neck rigidity.  Cardiovascular: No rhonchi or crepitations.  Respiratory: S1-S2 heard.  Abdomen: Soft mildly distended nontender.  Skin: No rash.  Musculoskeletal: Bilateral lower extremity edema  present.  Psychiatric: Appears normal.  Neurologic: Alert awake oriented time place and person.  Moves all extremities.  Labs on Admission:  Basic Metabolic Panel: Recent Labs  Lab 09/11/19 1950  NA 135  K 4.2  CL 108  CO2 17*  GLUCOSE 86  BUN 21*  CREATININE 0.75  CALCIUM 8.4*   Liver Function Tests: Recent Labs  Lab 09/11/19 1950  AST 20  ALT 18  ALKPHOS 52  BILITOT 0.2*  PROT 5.4*  ALBUMIN 2.4*   No results for input(s): LIPASE, AMYLASE in the last 168 hours. No results for input(s): AMMONIA in the last 168 hours. CBC: Recent Labs  Lab 09/11/19 1950  WBC 10.4  HGB 8.8*  HCT 27.4*  MCV 83.5  PLT 246   Cardiac Enzymes: No results for input(s): CKTOTAL, CKMB, CKMBINDEX, TROPONINI in the last 168 hours. BNP: Invalid input(s): POCBNP CBG: No results for input(s): GLUCAP in the last 168 hours.  Radiological Exams on Admission: DG Chest  2 View  Result Date: 09/11/2019 CLINICAL DATA:  Chest pain, possible COVID EXAM: CHEST - 2 VIEW COMPARISON:  08/06/2018 FINDINGS: The heart size and mediastinal contours are within normal limits. Subtle heterogeneous airspace opacity of the bilateral lung bases. The visualized skeletal structures are unremarkable. Shunt catheter tubing projects over the right neck and chest. IMPRESSION: Subtle heterogeneous airspace opacity of the bilateral lung bases, suspicious for infection and particularly COVID-19 if suspected. Electronically Signed   By: Eddie Candle M.D.   On: 09/11/2019 20:48    EKG: Independently reviewed.  Normal sinus rhythm with nonspecific T wave changes.  Time spent: 50 minutes.  Rise Patience [facility]  If 7PM-7AM, please contact night-coverage [email or web address] Password [email or web address] 09/12/2019, 1:31 AM

## 2019-09-12 NOTE — Progress Notes (Addendum)
PROGRESS NOTE  Maria Payne [id number] DOB: 14-Jun-1991 DOA: 09/11/2019 PCP: Hali Marry, MD  Brief History   This patient is a 29 yr old woman who has a 28 week IUP. She was admitted last night by OB/GYN due to severe pre-eclampsia. The patient also has complaints of cephalgia, and pleuritic chest pain mainly in her upper back for the last 2-3 days. She has had exacerbation of this pain and dyspnea on exertion. She has not been febrile or hypoxic. COVID-19 is negative. Respiratory pathogen panel was negative. She does not have leukocytosis. Procalcitonin is negative. CTA of the chest was performed and ruled out pulmonary embolus. CTA chest did demonstrate mild pulmonary edema. Initial troponin was elevated at 19, but trended down to 15.   The patient's past medical history is significant for Idiopathic intracranial hypertension for which she had a VP shunt placed several years ago which was converted to a VA shunt by Dr. Alison Murray on 07/24/2019 due to her increasing abdominal pain during her pregnancy.   She tends to have headaches that are more prominent in the morning, but decrease in intesity throughout the day.   Consultants  . [facility]  Procedures  . None  Antibiotics  . None  Interval History/Subjective  The patient is awake and alert. She states that she is feeling better, although she continues to complain of pain in her epigastrum/chest with deep inspiration. She also complains of a headache.   Objective   Vitals:  Vitals:   09/12/19 1306 09/12/19 1400  BP:    Pulse:    Resp: 16 16  Temp:    SpO2:     Exam:  Constitutional:  . The patient is awake, alert, and oriented x 3. No acute distress. Respiratory:  . No increased work of breathing. . No wheezes, rales, or rhonchi . No tactile fremitus Cardiovascular:  . Regular rate and rhythm . No murmurs, ectopy, or gallups. . No lateral PMI. No thrills. Abdomen:  . Abdomen is  gravid, non-tender, non-distended . No hernias, masses, or organomegaly . Normoactive bowel sounds.  Musculoskeletal:  . No cyanosis, clubbing, or edema Skin:  . No rashes, lesions, ulcers . palpation of skin: no induration or nodules Neurologic:  . CN 2-12 intact . Sensation all 4 extremities intact Psychiatric:  . Mental status o Mood, affect appropriate o Orientation to person, place, time  . judgment and insight appear intact  I have personally reviewed the following:   Today's Data  . Orthoptist  . COVID-19, Respiratory viral panel negative.  Imaging  . CTA chest  Cardiology Data  . Troponins  Scheduled Meds: . betamethasone acetate-betamethasone sodium phosphate  12 mg Intramuscular Q24 Hr x 2  . docusate sodium  100 mg Oral Daily  . escitalopram  20 mg Oral Daily  . hydrALAZINE  10 mg Oral Once  . labetalol  200 mg Oral BID  . prenatal multivitamin  1 tablet Oral Q1200   Continuous Infusions: . lactated ringers 25 mL/hr at 09/12/19 0600  . magnesium sulfate 2 g/hr (09/12/19 0413)    Active Problems:   IIH (idiopathic intracranial hypertension)   S/P ventriculoperitoneal shunt   Severe preeclampsia, third trimester   Pneumonia   LOS: 1 day   A & P  Pleuritic type of chest pain: Chest x-ray in ED was concerning for pneumonia. The patient's primary complaint was pleuritic chest pain with radiation to the upper back over the last 2 to  3 days.  Pain increases on exertion become short of breath.  No definite signs of pneumonia.  Patient is afebrile and not hypoxic and lab work show no leukocytosis. COVID-19 is negative. Respiratory pathogen panel was negative. She does not have leukocytosis. Procalcitonin is negative. CTA of the chest was performed and ruled out pulmonary embolus. CTA chest did demonstrate mild pulmonary edema. It is expected that this is related to her pre-eclampsia and elevated blood pressures. Initial troponin was elevated at 19, but  trended down to 15. Also of interest is the radiation of the patient to her upper back. The patient has had a cholecystectomy.  Severe pre-eclampsia: Managed by OB/GYN.  Normocytic normochromic anemia: as per OB/GYN.  Idiopathic intracranial hypertension status post VA shunt which was recently changed from VP shunt by neurosurgery.  Also has intracranial angioplasty done previously on aspirin.  Cephalgia: Chronically more prominent in the am.  I have seen and examined this patient myself. I have spent 35 minutes in her evaluation and care.  Kailan Laws, DO [facility] Direct contact: see [email or web address]  7PM-7AM contact night coverage as above 09/12/2019, 2:24 PM  LOS: 1 day

## 2019-09-12 NOTE — Progress Notes (Signed)
Per Dr. Rosana Hoes, augmentin and prednisone ordered on 09-09-2019 prior to admission will not be continued.

## 2019-09-12 NOTE — Progress Notes (Signed)
Yasmine from ultrasound department called to inquire when patient had last eaten. Patient stated she last ate a t 1215. Instructed Ms. Lawson to not eat or drink anything until after ultrasound of right upper quadrant was completed.

## 2019-09-13 ENCOUNTER — Inpatient Hospital Stay (HOSPITAL_COMMUNITY): Payer: Medicaid Other

## 2019-09-13 DIAGNOSIS — Z982 Presence of cerebrospinal fluid drainage device: Secondary | ICD-10-CM

## 2019-09-13 DIAGNOSIS — Z3A28 28 weeks gestation of pregnancy: Secondary | ICD-10-CM

## 2019-09-13 DIAGNOSIS — Z362 Encounter for other antenatal screening follow-up: Secondary | ICD-10-CM

## 2019-09-13 DIAGNOSIS — O133 Gestational [pregnancy-induced] hypertension without significant proteinuria, third trimester: Secondary | ICD-10-CM

## 2019-09-13 DIAGNOSIS — O3413 Maternal care for benign tumor of corpus uteri, third trimester: Secondary | ICD-10-CM

## 2019-09-13 DIAGNOSIS — O1413 Severe pre-eclampsia, third trimester: Secondary | ICD-10-CM

## 2019-09-13 DIAGNOSIS — O2693 Pregnancy related conditions, unspecified, third trimester: Secondary | ICD-10-CM

## 2019-09-13 DIAGNOSIS — O99891 Other specified diseases and conditions complicating pregnancy: Secondary | ICD-10-CM

## 2019-09-13 DIAGNOSIS — J45909 Unspecified asthma, uncomplicated: Secondary | ICD-10-CM

## 2019-09-13 DIAGNOSIS — O99213 Obesity complicating pregnancy, third trimester: Secondary | ICD-10-CM

## 2019-09-13 LAB — URINALYSIS, ROUTINE W REFLEX MICROSCOPIC
Bilirubin Urine: NEGATIVE
Glucose, UA: 50 mg/dL — AB
Ketones, ur: NEGATIVE mg/dL
Leukocytes,Ua: NEGATIVE
Nitrite: NEGATIVE
Protein, ur: >=300 mg/dL — AB
Specific Gravity, Urine: 1.043 — ABNORMAL HIGH (ref 1.005–1.030)
pH: 5 (ref 5.0–8.0)

## 2019-09-13 LAB — CBC
HCT: 28.1 % — ABNORMAL LOW (ref 36.0–46.0)
Hemoglobin: 9 g/dL — ABNORMAL LOW (ref 12.0–15.0)
MCH: 26.7 pg (ref 26.0–34.0)
MCHC: 32 g/dL (ref 30.0–36.0)
MCV: 83.4 fL (ref 80.0–100.0)
Platelets: 255 10*3/uL (ref 150–400)
RBC: 3.37 MIL/uL — ABNORMAL LOW (ref 3.87–5.11)
RDW: 13.4 % (ref 11.5–15.5)
WBC: 9.3 10*3/uL (ref 4.0–10.5)
nRBC: 0 % (ref 0.0–0.2)

## 2019-09-13 LAB — COMPREHENSIVE METABOLIC PANEL
ALT: 16 U/L (ref 0–44)
ALT: 15 U/L (ref 0–44)
AST: 20 U/L (ref 15–41)
AST: 19 U/L (ref 15–41)
Albumin: 2.3 g/dL — ABNORMAL LOW (ref 3.5–5.0)
Albumin: 2.4 g/dL — ABNORMAL LOW (ref 3.5–5.0)
Alkaline Phosphatase: 53 U/L (ref 38–126)
Alkaline Phosphatase: 58 U/L (ref 38–126)
Anion gap: 13 (ref 5–15)
Anion gap: 9 (ref 5–15)
BUN: 26 mg/dL — ABNORMAL HIGH (ref 6–20)
BUN: 29 mg/dL — ABNORMAL HIGH (ref 6–20)
CO2: 15 mmol/L — ABNORMAL LOW (ref 22–32)
CO2: 18 mmol/L — ABNORMAL LOW (ref 22–32)
Calcium: 8.6 mg/dL — ABNORMAL LOW (ref 8.9–10.3)
Calcium: 8.5 mg/dL — ABNORMAL LOW (ref 8.9–10.3)
Chloride: 109 mmol/L (ref 98–111)
Chloride: 104 mmol/L (ref 98–111)
Creatinine, Ser: 1.03 mg/dL — ABNORMAL HIGH (ref 0.44–1.00)
Creatinine, Ser: 0.92 mg/dL (ref 0.44–1.00)
GFR calc Af Amer: >60 mL/min (ref >60)
GFR calc Af Amer: >60 mL/min (ref >60)
GFR calc non Af Amer: >60 mL/min (ref >60)
GFR calc non Af Amer: >60 mL/min (ref >60)
Glucose, Bld: 143 mg/dL — ABNORMAL HIGH (ref 70–99)
Glucose, Bld: 114 mg/dL — ABNORMAL HIGH (ref 70–99)
Potassium: 4.8 mmol/L (ref 3.5–5.1)
Potassium: 4 mmol/L (ref 3.5–5.1)
Sodium: 137 mmol/L (ref 135–145)
Sodium: 131 mmol/L — ABNORMAL LOW (ref 135–145)
Total Bilirubin: 0.5 mg/dL (ref 0.3–1.2)
Total Bilirubin: 0.3 mg/dL (ref 0.3–1.2)
Total Protein: 5 g/dL — ABNORMAL LOW (ref 6.5–8.1)
Total Protein: 5.3 g/dL — ABNORMAL LOW (ref 6.5–8.1)

## 2019-09-13 LAB — MAGNESIUM: Magnesium: 3.1 mg/dL — ABNORMAL HIGH (ref 1.7–2.4)

## 2019-09-13 MED ORDER — SODIUM CHLORIDE 0.9 % IV SOLN: INTRAVENOUS | Status: DC

## 2019-09-13 MED ORDER — NIFEDIPINE 10 MG PO CAPS: 20.0000 mg | ORAL_CAPSULE | ORAL | Status: DC | PRN

## 2019-09-13 MED ORDER — LACTATED RINGERS IV SOLN: INTRAVENOUS | Status: DC

## 2019-09-13 MED ORDER — LABETALOL HCL 200 MG PO TABS
400.0000 mg | ORAL_TABLET | Freq: Two times a day (BID) | ORAL | Status: DC
  Administered 2019-09-13: 20:00:00 400 mg via ORAL
  Filled 2019-09-13: qty 2

## 2019-09-13 MED ORDER — MAGNESIUM SULFATE 40 GM/1000ML IV SOLN
2.0000 g/h | INTRAVENOUS | Status: DC
  Filled 2019-09-13: qty 1000

## 2019-09-13 MED ORDER — LABETALOL HCL 200 MG PO TABS
600.0000 mg | ORAL_TABLET | Freq: Three times a day (TID) | ORAL | Status: DC
  Administered 2019-09-14: 600 mg via ORAL
  Filled 2019-09-13: qty 3

## 2019-09-13 MED ORDER — MAGNESIUM SULFATE BOLUS VIA INFUSION
4.0000 g | Freq: Once | INTRAVENOUS | Status: AC
  Administered 2019-09-13: 18:00:00 4 g via INTRAVENOUS
  Filled 2019-09-13: qty 1000

## 2019-09-13 MED ORDER — ASPIRIN EC 81 MG PO TBEC
81.0000 mg | DELAYED_RELEASE_TABLET | Freq: Every day | ORAL | Status: DC
  Administered 2019-09-13 – 2019-09-14 (×2): 81 mg via ORAL
  Filled 2019-09-13 (×2): qty 1

## 2019-09-13 MED ORDER — LABETALOL HCL 5 MG/ML IV SOLN: 40.0000 mg | INTRAVENOUS | Status: DC | PRN

## 2019-09-13 MED ORDER — LABETALOL HCL 100 MG PO TABS
100.0000 mg | ORAL_TABLET | Freq: Once | ORAL | Status: AC
  Administered 2019-09-13: 15:00:00 100 mg via ORAL
  Filled 2019-09-13: qty 1

## 2019-09-13 MED ORDER — LABETALOL HCL 200 MG PO TABS
200.0000 mg | ORAL_TABLET | Freq: Once | ORAL | Status: AC
  Administered 2019-09-13: 22:00:00 200 mg via ORAL
  Filled 2019-09-13: qty 1

## 2019-09-13 MED ORDER — LABETALOL HCL 200 MG PO TABS
300.0000 mg | ORAL_TABLET | Freq: Two times a day (BID) | ORAL | Status: DC
  Administered 2019-09-13: 09:00:00 300 mg via ORAL
  Filled 2019-09-13: qty 1

## 2019-09-13 MED ORDER — SODIUM CHLORIDE 0.9 % IV SOLN
510.0000 mg | INTRAVENOUS | Status: DC
  Administered 2019-09-13: 09:00:00 510 mg via INTRAVENOUS
  Filled 2019-09-13: qty 17

## 2019-09-13 MED ORDER — NIFEDIPINE 10 MG PO CAPS
10.0000 mg | ORAL_CAPSULE | ORAL | Status: DC | PRN
  Administered 2019-09-13 – 2019-09-14 (×3): 10 mg via ORAL
  Filled 2019-09-13 (×3): qty 1

## 2019-09-13 MED ORDER — NIFEDIPINE 10 MG PO CAPS: 10.0000 mg | ORAL_CAPSULE | ORAL | Status: DC | PRN

## 2019-09-13 MED ORDER — LABETALOL HCL 200 MG PO TABS: 600.0000 mg | ORAL_TABLET | Freq: Two times a day (BID) | ORAL | Status: DC

## 2019-09-13 MED ORDER — LABETALOL HCL 5 MG/ML IV SOLN
40.0000 mg | INTRAVENOUS | Status: DC | PRN
  Administered 2019-09-14: 40 mg via INTRAVENOUS
  Filled 2019-09-13: qty 8

## 2019-09-13 NOTE — Consult Note (Signed)
Neonatology Antenatal Consultation: Requested by: Claiborne Billings Reason: severe-pre-eclampsia, 28 6/7 EGA  I met briefly with Ms. Burnette and discussed the likely immediate treatment plans and typical problems of babies born at this gestation.  I emphasized the high survival rate and the probable short duration of any mechanical ventilatory support.  I discussed the feeding approach and the need for prolonged hospital care.  I understand her husband is en route and I can meet them both again to provide any further answers.  R.L. Keison Glendinning M.D.

## 2019-09-13 NOTE — Progress Notes (Signed)
Faculty Note  Patient doing well on mag, reports her chest feels heavier since mag started. UOP has improved slightly and Cr improved down to 0.93 from 1.03. BP remain in severe range although slightly improved, increased labetalol to 600 mg TID tonight. Will hold off on delivery for now and try to control BP, if unable to control, will induce. Pt and husband verbalize understanding and are in agreement with plan.   Feliz Beam, M.D. Attending Center for [employer])

## 2019-09-13 NOTE — Progress Notes (Signed)
Faculty Note  Patient still having intermittent headaches, improves with tylenol and fioricet then returns. Still feels short of breath with exertion and feels like she has "a vest on her chest." No SOB with sitting in bed. No other complaints.  Blood pressure (!) 159/82, pulse 82, temperature 98.3 F (36.8 C), temperature source Oral, resp. rate 18, weight 133.6 kg, last menstrual period 02/23/2019, SpO2 98 %.  Gen: alert, oriented Ext: swollen bilaterally  CMP Latest Ref Rng & Units 09/13/2019 09/11/2019 05/13/2019  Glucose 70 - 99 mg/dL 143(H) 86 79  BUN 6 - 20 mg/dL 26(H) 21(H) 9  Creatinine 0.44 - 1.00 mg/dL 1.03(H) 0.75 0.59  Sodium 135 - 145 mmol/L 137 135 136  Potassium 3.5 - 5.1 mmol/L 4.8 4.2 3.9  Chloride 98 - 111 mmol/L 109 108 103  CO2 22 - 32 mmol/L 15(L) 17(L) 22  Calcium 8.9 - 10.3 mg/dL 8.6(L) 8.4(L) 9.1  Total Protein 6.5 - 8.1 g/dL 5.0(L) 5.4(L) 6.7  Total Bilirubin 0.3 - 1.2 mg/dL 0.5 0.2(L) 0.3  Alkaline Phos 38 - 126 U/L 53 52 30(L)  AST 15 - 41 U/L 20 20 16   ALT 0 - 44 U/L 16 18 12     A/P: 29 yo G1P0 @ [redacted]w[redacted]d admitted with severe pre-eclampsia (questionable history of cHTN which patient denies), ongoing shortness of breath and IIH with VA shunt. Received 2nd dose BTMZ @ 2030 on 1/2 and will be 48 hrs steroid complete tonight. With worsening BP despite upwards titration of PO labetalol. Cr noted to be bumped today to 1.03 from 0.79 on admission and not quite doubled from early pregnancy Cr. Worsening UOP today, < 50 mL/hr. Has been seen by hospitalist team for chest pain and had negative workup for COVID, pneumonia or viral infection. She remains satting well on room air despite symptoms.  Given increased creatinine and increasingly difficult;y controlling BP, am concerned for worsening pre-eclampsia. Reviewed course with MFM who recommend repeat lab work now and move towards delivery with any significant changes. Will restart magnesium at this point until BP under better  control.   I reviewed the above with the patient and her husband, reviewed that will consider induction tonight if labs are significantly worsening. Reviewed risks of pre-eclampsia and risks of premature delivery. They are in agreement with plan. NICU aware and will consult. EFW 1343 gms, 48% today.  Patient with IIH and VA shunt. Per patient and per notes in Care Everywhere from Neurosurgery appt 08/27/19, patient has no contra-indication to a vaginal delivery. Reviewed potential need for c-section if she decompensates quickly or if fetus does not tolerate labor, but that at this point, we would prefer induction of labor to avoid risks of c-section if possible. Patient verbalizes understanding and is in agreement with this plan.  CMP/CBC now Will base further management on outcome of labwork   K. Arvilla Meres, M.D. Attending Center for [employer] manager)

## 2019-09-13 NOTE — Progress Notes (Signed)
Patient ID: Maria Payne, female   DOB: [birth year], 29 y.o.   MRN: [id number] [place]) NOTE  Maria Payne is a 28 y.o. G1P0000 at [redacted]w[redacted]d by best clinical estimate who is admitted for preeclampsia.   Fetal presentation is cephalic. Length of Stay:  2  Days  Subjective: Feels better with decreased edema but notes dysuria Patient reports the fetal movement as active. Patient reports uterine contraction  activity as none. Patient reports  vaginal bleeding as none. Patient describes fluid per vagina as None.  Vitals:  Blood pressure (!) 148/86, pulse 93, temperature 98.2 F (36.8 C), temperature source Oral, resp. rate 18, weight 134.3 kg, last menstrual period 02/23/2019, SpO2 100 %. Physical Examination:  General appearance - alert, well appearing, and in no distress Heart - normal rate and regular rhythm Abdomen - soft, nontender, nondistended Fundal Height:  size equals dates Cervical Exam: Not evaluated.  Extremities: extremities normal, atraumatic, no cyanosis or edema and Homans sign is negative, no sign of DVT with DTRs 2+ bilaterally Membranes:intact  Fetal Monitoring:   Fetal Heart Rate A  Mode External filed at 09/12/2019 2124  Baseline Rate (A) 135 bpm filed at 09/12/2019 2124  Variability <5 BPM, 6-25 BPM filed at 09/12/2019 2124  Accelerations 10 x 10 filed at 09/12/2019 2124  Decelerations None filed at 09/12/2019 2124     Labs:  Results for orders placed or performed during the hospital encounter of 09/11/19 (from the past 24 hour(s))  Troponin I (High Sensitivity)   Collection Time: 09/12/19  7:45 AM  Result Value Ref Range   Troponin I (High Sensitivity) 15 <18 ng/L     Medications:  Scheduled . docusate sodium  100 mg Oral Daily  . escitalopram  20 mg Oral Daily  . hydrALAZINE  10 mg Oral Once  . labetalol  300 mg Oral BID  . prenatal multivitamin  1 tablet Oral Q1200   I have reviewed the patient's current  medications.  ASSESSMENT: Patient Active Problem List   Diagnosis Date Noted  . Pneumonia 09/12/2019  . Severe preeclampsia, third trimester 09/11/2019  . Severe persistent asthma with exacerbation 09/09/2019  . Hypertension 08/14/2019  . Supervision of high risk pregnancy, antepartum 04/23/2019  . Obesity in pregnancy 04/23/2019  . Shunt malfunction 04/20/2017  . 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 Pain of upper abdomen 11/21/2015  . Psychic factors associated with diseases classified elsewhere 12/31/2013  . MDD (major depressive disorder) 04/04/2012  . GAD (generalized anxiety disorder) 04/04/2012  . 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 IIH (idiopathic intracranial hypertension) 09/10/2011  . Papilledema associated with increased intracranial pressure 08/20/2011  . DERMATITIS, PERIORAL 05/17/2010    PLAN: Increase labetalol to 300 BID U/A per request, previous was reviewed Repeat CMP, follow U/O and creatinine  Emeterio Reeve 09/13/2019,7:21 AM

## 2019-09-13 NOTE — Progress Notes (Signed)
Work up has revealed no PE, no pneumonia, no viral infection. Mild pulmonary edema only which is likely secondary to pre-eclampsia. She is saturating 100% on room air. Will sign off. [facility] remains available for questions.

## 2019-09-14 ENCOUNTER — Inpatient Hospital Stay (HOSPITAL_COMMUNITY): Payer: Medicaid Other | Admitting: Anesthesiology

## 2019-09-14 ENCOUNTER — Inpatient Hospital Stay (HOSPITAL_COMMUNITY): Payer: Medicaid Other

## 2019-09-14 ENCOUNTER — Ambulatory Visit (HOSPITAL_COMMUNITY): Payer: Medicaid Other

## 2019-09-14 ENCOUNTER — Encounter (HOSPITAL_COMMUNITY): Payer: Self-pay | Admitting: Certified Registered Nurse Anesthetist

## 2019-09-14 ENCOUNTER — Encounter (HOSPITAL_COMMUNITY): Payer: Self-pay

## 2019-09-14 DIAGNOSIS — I509 Heart failure, unspecified: Secondary | ICD-10-CM

## 2019-09-14 DIAGNOSIS — R079 Chest pain, unspecified: Secondary | ICD-10-CM

## 2019-09-14 LAB — COMPREHENSIVE METABOLIC PANEL
ALT: 14 U/L (ref 0–44)
ALT: 14 U/L (ref 0–44)
ALT: 15 U/L (ref 0–44)
AST: 17 U/L (ref 15–41)
AST: 17 U/L (ref 15–41)
AST: 19 U/L (ref 15–41)
Albumin: 2.4 g/dL — ABNORMAL LOW (ref 3.5–5.0)
Albumin: 2.1 g/dL — ABNORMAL LOW (ref 3.5–5.0)
Albumin: 2.2 g/dL — ABNORMAL LOW (ref 3.5–5.0)
Alkaline Phosphatase: 53 U/L (ref 38–126)
Alkaline Phosphatase: 51 U/L (ref 38–126)
Alkaline Phosphatase: 55 U/L (ref 38–126)
Anion gap: 10 (ref 5–15)
Anion gap: 11 (ref 5–15)
Anion gap: 10 (ref 5–15)
BUN: 27 mg/dL — ABNORMAL HIGH (ref 6–20)
BUN: 28 mg/dL — ABNORMAL HIGH (ref 6–20)
BUN: 26 mg/dL — ABNORMAL HIGH (ref 6–20)
CO2: 19 mmol/L — ABNORMAL LOW (ref 22–32)
CO2: 20 mmol/L — ABNORMAL LOW (ref 22–32)
CO2: 20 mmol/L — ABNORMAL LOW (ref 22–32)
Calcium: 8.2 mg/dL — ABNORMAL LOW (ref 8.9–10.3)
Calcium: 7.6 mg/dL — ABNORMAL LOW (ref 8.9–10.3)
Calcium: 7.7 mg/dL — ABNORMAL LOW (ref 8.9–10.3)
Chloride: 106 mmol/L (ref 98–111)
Chloride: 104 mmol/L (ref 98–111)
Chloride: 106 mmol/L (ref 98–111)
Creatinine, Ser: 0.97 mg/dL (ref 0.44–1.00)
Creatinine, Ser: 0.85 mg/dL (ref 0.44–1.00)
Creatinine, Ser: 0.91 mg/dL (ref 0.44–1.00)
GFR calc Af Amer: >60 mL/min (ref >60)
GFR calc Af Amer: >60 mL/min (ref >60)
GFR calc Af Amer: >60 mL/min (ref >60)
GFR calc non Af Amer: >60 mL/min (ref >60)
GFR calc non Af Amer: >60 mL/min (ref >60)
GFR calc non Af Amer: >60 mL/min (ref >60)
Glucose, Bld: 106 mg/dL — ABNORMAL HIGH (ref 70–99)
Glucose, Bld: 91 mg/dL (ref 70–99)
Glucose, Bld: 85 mg/dL (ref 70–99)
Potassium: 4.9 mmol/L (ref 3.5–5.1)
Potassium: 4.2 mmol/L (ref 3.5–5.1)
Potassium: 4.5 mmol/L (ref 3.5–5.1)
Sodium: 135 mmol/L (ref 135–145)
Sodium: 135 mmol/L (ref 135–145)
Sodium: 136 mmol/L (ref 135–145)
Total Bilirubin: 0.5 mg/dL (ref 0.3–1.2)
Total Bilirubin: 0.4 mg/dL (ref 0.3–1.2)
Total Bilirubin: 0.5 mg/dL (ref 0.3–1.2)
Total Protein: 5.2 g/dL — ABNORMAL LOW (ref 6.5–8.1)
Total Protein: 4.6 g/dL — ABNORMAL LOW (ref 6.5–8.1)
Total Protein: 4.8 g/dL — ABNORMAL LOW (ref 6.5–8.1)

## 2019-09-14 LAB — CBC
HCT: 27.7 % — ABNORMAL LOW (ref 36.0–46.0)
HCT: 25.7 % — ABNORMAL LOW (ref 36.0–46.0)
HCT: 27.2 % — ABNORMAL LOW (ref 36.0–46.0)
HCT: 29.2 % — ABNORMAL LOW (ref 36.0–46.0)
Hemoglobin: 9 g/dL — ABNORMAL LOW (ref 12.0–15.0)
Hemoglobin: 8.2 g/dL — ABNORMAL LOW (ref 12.0–15.0)
Hemoglobin: 8.8 g/dL — ABNORMAL LOW (ref 12.0–15.0)
Hemoglobin: 9.4 g/dL — ABNORMAL LOW (ref 12.0–15.0)
MCH: 27.4 pg (ref 26.0–34.0)
MCH: 27.1 pg (ref 26.0–34.0)
MCH: 27.1 pg (ref 26.0–34.0)
MCH: 27.1 pg (ref 26.0–34.0)
MCHC: 32.5 g/dL (ref 30.0–36.0)
MCHC: 31.9 g/dL (ref 30.0–36.0)
MCHC: 32.4 g/dL (ref 30.0–36.0)
MCHC: 32.2 g/dL (ref 30.0–36.0)
MCV: 84.2 fL (ref 80.0–100.0)
MCV: 84.8 fL (ref 80.0–100.0)
MCV: 83.7 fL (ref 80.0–100.0)
MCV: 84.1 fL (ref 80.0–100.0)
Platelets: 261 10*3/uL (ref 150–400)
Platelets: 244 10*3/uL (ref 150–400)
Platelets: 254 10*3/uL (ref 150–400)
Platelets: 273 10*3/uL (ref 150–400)
RBC: 3.29 MIL/uL — ABNORMAL LOW (ref 3.87–5.11)
RBC: 3.03 MIL/uL — ABNORMAL LOW (ref 3.87–5.11)
RBC: 3.25 MIL/uL — ABNORMAL LOW (ref 3.87–5.11)
RBC: 3.47 MIL/uL — ABNORMAL LOW (ref 3.87–5.11)
RDW: 13.3 % (ref 11.5–15.5)
RDW: 13.4 % (ref 11.5–15.5)
RDW: 13.5 % (ref 11.5–15.5)
RDW: 13.7 % (ref 11.5–15.5)
WBC: 10.3 10*3/uL (ref 4.0–10.5)
WBC: 8.7 10*3/uL (ref 4.0–10.5)
WBC: 8.4 10*3/uL (ref 4.0–10.5)
WBC: 10.1 10*3/uL (ref 4.0–10.5)
nRBC: 0 % (ref 0.0–0.2)
nRBC: 0 % (ref 0.0–0.2)
nRBC: 0.2 % (ref 0.0–0.2)
nRBC: 0 % (ref 0.0–0.2)

## 2019-09-14 LAB — TROPONIN I (HIGH SENSITIVITY)
Troponin I (High Sensitivity): 27 ng/L — ABNORMAL HIGH (ref <18)
Troponin I (High Sensitivity): 25 ng/L — ABNORMAL HIGH (ref <18)
Troponin I (High Sensitivity): 26 ng/L — ABNORMAL HIGH (ref <18)

## 2019-09-14 LAB — ECHOCARDIOGRAM COMPLETE
Height: 68 in
Weight: 4828 oz

## 2019-09-14 LAB — BRAIN NATRIURETIC PEPTIDE: B Natriuretic Peptide: 713.4 pg/mL — ABNORMAL HIGH (ref 0.0–100.0)

## 2019-09-14 LAB — MAGNESIUM
Magnesium: 5 mg/dL — ABNORMAL HIGH (ref 1.7–2.4)
Magnesium: 4 mg/dL — ABNORMAL HIGH (ref 1.7–2.4)

## 2019-09-14 MED ORDER — FENTANYL-BUPIVACAINE-NACL 0.5-0.125-0.9 MG/250ML-% EP SOLN
EPIDURAL | Status: AC
  Filled 2019-09-14: qty 250

## 2019-09-14 MED ORDER — OXYTOCIN BOLUS FROM INFUSION
500.0000 mL | Freq: Once | INTRAVENOUS | Status: AC
  Administered 2019-09-15: 19:00:00 500 mL via INTRAVENOUS

## 2019-09-14 MED ORDER — LACTATED RINGERS IV SOLN: 500.0000 mL | INTRAVENOUS | Status: DC | PRN

## 2019-09-14 MED ORDER — PENICILLIN G POT IN DEXTROSE 60000 UNIT/ML IV SOLN
3.0000 10*6.[IU] | INTRAVENOUS | Status: DC
  Administered 2019-09-14 – 2019-09-15 (×7): 3 10*6.[IU] via INTRAVENOUS
  Filled 2019-09-14 (×8): qty 50

## 2019-09-14 MED ORDER — MISOPROSTOL 50MCG HALF TABLET
50.0000 ug | ORAL_TABLET | ORAL | Status: DC | PRN
  Administered 2019-09-14 – 2019-09-15 (×4): 50 ug via BUCCAL
  Filled 2019-09-14 (×4): qty 1

## 2019-09-14 MED ORDER — FENTANYL-BUPIVACAINE-NACL 0.5-0.125-0.9 MG/250ML-% EP SOLN
12.0000 mL/h | EPIDURAL | Status: DC | PRN
  Administered 2019-09-15: 12 mL/h via EPIDURAL
  Filled 2019-09-14 (×2): qty 250

## 2019-09-14 MED ORDER — LACTATED RINGERS IV SOLN: 500.0000 mL | Freq: Once | INTRAVENOUS | Status: DC

## 2019-09-14 MED ORDER — LABETALOL HCL 5 MG/ML IV SOLN
40.0000 mg | INTRAVENOUS | Status: DC | PRN
  Administered 2019-09-14: 40 mg via INTRAVENOUS
  Filled 2019-09-14 (×2): qty 8

## 2019-09-14 MED ORDER — SODIUM CHLORIDE 0.9 % IV SOLN
5.0000 10*6.[IU] | Freq: Once | INTRAVENOUS | Status: AC
  Administered 2019-09-14: 13:00:00 5 10*6.[IU] via INTRAVENOUS
  Filled 2019-09-14: qty 5

## 2019-09-14 MED ORDER — FUROSEMIDE 10 MG/ML IJ SOLN
20.0000 mg | Freq: Once | INTRAMUSCULAR | Status: AC
  Administered 2019-09-14: 05:00:00 20 mg via INTRAVENOUS
  Filled 2019-09-14: qty 2

## 2019-09-14 MED ORDER — HYDRALAZINE HCL 20 MG/ML IJ SOLN
10.0000 mg | INTRAMUSCULAR | Status: DC | PRN
  Administered 2019-09-14: 10 mg via INTRAVENOUS
  Filled 2019-09-14: qty 1

## 2019-09-14 MED ORDER — LACTATED RINGERS IV SOLN: INTRAVENOUS | Status: DC

## 2019-09-14 MED ORDER — EPHEDRINE 5 MG/ML INJ: 10.0000 mg | INTRAVENOUS | Status: DC | PRN

## 2019-09-14 MED ORDER — LABETALOL HCL 5 MG/ML IV SOLN
80.0000 mg | INTRAVENOUS | Status: DC | PRN
  Administered 2019-09-14: 17:00:00 80 mg via INTRAVENOUS
  Filled 2019-09-14: qty 16

## 2019-09-14 MED ORDER — PHENYLEPHRINE 40 MCG/ML (10ML) SYRINGE FOR IV PUSH (FOR BLOOD PRESSURE SUPPORT): 80.0000 ug | PREFILLED_SYRINGE | INTRAVENOUS | Status: DC | PRN

## 2019-09-14 MED ORDER — SODIUM CHLORIDE (PF) 0.9 % IJ SOLN
INTRAMUSCULAR | Status: DC | PRN
  Administered 2019-09-14: 12 mL/h via EPIDURAL

## 2019-09-14 MED ORDER — MAGNESIUM SULFATE 40 GM/1000ML IV SOLN
1.0000 g/h | INTRAVENOUS | Status: DC
  Administered 2019-09-14: 1 g/h via INTRAVENOUS
  Filled 2019-09-14: qty 1000

## 2019-09-14 MED ORDER — LIDOCAINE HCL URETHRAL/MUCOSAL 2 % EX GEL
1.0000 "application " | Freq: Once | CUTANEOUS | Status: AC
  Administered 2019-09-14: 1 via URETHRAL
  Filled 2019-09-14: qty 20

## 2019-09-14 MED ORDER — HYDROXYZINE HCL 50 MG PO TABS
50.0000 mg | ORAL_TABLET | Freq: Three times a day (TID) | ORAL | Status: DC | PRN
  Administered 2019-09-14: 14:00:00 50 mg via ORAL
  Filled 2019-09-14: qty 1

## 2019-09-14 MED ORDER — FLEET ENEMA 7-19 GM/118ML RE ENEM: 1.0000 | ENEMA | RECTAL | Status: DC | PRN

## 2019-09-14 MED ORDER — LABETALOL HCL 5 MG/ML IV SOLN
20.0000 mg | INTRAVENOUS | Status: DC | PRN
  Administered 2019-09-14 – 2019-09-15 (×5): 20 mg via INTRAVENOUS
  Filled 2019-09-14 (×7): qty 4

## 2019-09-14 MED ORDER — LIDOCAINE HCL (PF) 1 % IJ SOLN
30.0000 mL | INTRAMUSCULAR | Status: DC | PRN
  Filled 2019-09-14: qty 30

## 2019-09-14 MED ORDER — LIDOCAINE HCL (PF) 1 % IJ SOLN
INTRAMUSCULAR | Status: DC | PRN
  Administered 2019-09-14 (×2): 4 mL via EPIDURAL

## 2019-09-14 MED ORDER — MISOPROSTOL 25 MCG QUARTER TABLET: 25.0000 ug | ORAL_TABLET | ORAL | Status: DC | PRN

## 2019-09-14 MED ORDER — PANTOPRAZOLE SODIUM 40 MG IV SOLR
40.0000 mg | INTRAVENOUS | Status: DC
  Administered 2019-09-14: 40 mg via INTRAVENOUS
  Filled 2019-09-14: qty 40

## 2019-09-14 MED ORDER — LABETALOL HCL 200 MG PO TABS
200.0000 mg | ORAL_TABLET | Freq: Two times a day (BID) | ORAL | Status: DC
  Administered 2019-09-14 – 2019-09-15 (×2): 200 mg via ORAL
  Filled 2019-09-14 (×2): qty 1

## 2019-09-14 MED ORDER — OXYTOCIN 40 UNITS IN NORMAL SALINE INFUSION - SIMPLE MED: 2.5000 [IU]/h | INTRAVENOUS | Status: DC

## 2019-09-14 MED ORDER — ONDANSETRON HCL 4 MG/2ML IJ SOLN: 4.0000 mg | Freq: Four times a day (QID) | INTRAMUSCULAR | Status: DC | PRN

## 2019-09-14 MED ORDER — FENTANYL CITRATE (PF) 100 MCG/2ML IJ SOLN
50.0000 ug | INTRAMUSCULAR | Status: DC | PRN
  Filled 2019-09-14: qty 2

## 2019-09-14 MED ORDER — SOD CITRATE-CITRIC ACID 500-334 MG/5ML PO SOLN: 30.0000 mL | ORAL | Status: DC | PRN

## 2019-09-14 MED ORDER — DIPHENHYDRAMINE HCL 50 MG/ML IJ SOLN: 12.5000 mg | INTRAMUSCULAR | Status: DC | PRN

## 2019-09-14 MED ORDER — FUROSEMIDE 10 MG/ML IJ SOLN
20.0000 mg | INTRAMUSCULAR | Status: AC
  Administered 2019-09-14 – 2019-09-15 (×4): 20 mg via INTRAVENOUS
  Filled 2019-09-14 (×4): qty 2

## 2019-09-14 MED ORDER — HYDROXYZINE HCL 50 MG PO TABS
25.0000 mg | ORAL_TABLET | Freq: Three times a day (TID) | ORAL | Status: DC | PRN
  Filled 2019-09-14 (×2): qty 1

## 2019-09-14 MED ORDER — ACETAMINOPHEN 325 MG PO TABS
650.0000 mg | ORAL_TABLET | ORAL | Status: DC | PRN
  Administered 2019-09-14 – 2019-09-15 (×4): 650 mg via ORAL
  Filled 2019-09-14 (×4): qty 2

## 2019-09-14 MED ORDER — PHENYLEPHRINE 40 MCG/ML (10ML) SYRINGE FOR IV PUSH (FOR BLOOD PRESSURE SUPPORT)
PREFILLED_SYRINGE | INTRAVENOUS | Status: AC
  Filled 2019-09-14: qty 10

## 2019-09-14 MED ORDER — TERBUTALINE SULFATE 1 MG/ML IJ SOLN: 0.2500 mg | Freq: Once | INTRAMUSCULAR | Status: DC | PRN

## 2019-09-14 MED ORDER — SODIUM CHLORIDE 0.9 % IV SOLN: INTRAVENOUS | Status: DC

## 2019-09-14 NOTE — Progress Notes (Signed)
Labor Progress Note Maria Payne is a 29 y.o. G1P0000 at [redacted]w[redacted]d presented for IOL from [facility] specialty for [name]. S: Getting more comfortable with epidural.   O:  BP 140/84   Pulse 80   Temp 98.1 F (36.7 C) (Oral)   Resp 17   Ht 5\' 8"  (1.727 m)   Wt (!) 136.9 kg Comment: nurse notified  LMP 02/23/2019 (Exact Date)   SpO2 96%   BMI 45.88 kg/m  EFM: 135, minimal to moderate variability, no accels, no decels TOCO: none  CVE: Dilation: Closed Effacement (%): Thick Station: Ballotable Presentation: Vertex Exam by:: Dr Marice Potter   A&P: 29 y.o. G1P0000 [redacted]w[redacted]d here for IOL for [name]. #Labor: Vertex by BSUS. S/p Cytotec x2. FB placed with speculum at this exam. Anticipate VD; CS as appropriate.  #Pain: per patient request #FWB: Cat II #GBS unknown; PCN ordered #[name]: Severe-range BP's and pulmonary edema. Cont Lasix IV 20 mg q4h for 4 doses and then PRN. Currently with 1-2+ pitting edema from waist down. On 2L [place]. Labetalol protocol ordered. Will run NS at 10 mL/hr and Mag at 1 g/hr (25 mL/hr). For first dose of PCN, patient will receive 250 mL over first hour but after this dose will be at 25 mL/hr for a total of 60 mL/hr IVF. Will place Foley for strict I's/O's. Echo results reviewed and largely WNL; some LVH. CMP/CBC q8h. Mag level q12h. Tele leads in place due to IV diuresis. Output adequate currently. Labetalol 200 mg BID ordered as patient has been through Labetalol protocol and continues to have elevated BP's. #IIH: Cont low dose ASA. 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  Patient also has history of intracranial stent for which patient takes aspirin. MOC: Desires interval BTL; possibly Depo during interval MOF: Breast  [name], MD 7:55 PM

## 2019-09-14 NOTE — Progress Notes (Signed)
Magnesium started

## 2019-09-14 NOTE — Progress Notes (Signed)
PROGRESS NOTE    Maria Payne  [id number] DOB: [birth year] DOA: 09/11/2019 PCP: Hali Marry, MD   Brief Narrative:  29 year old female with [redacted] weeks pregnant presented 2 days ago to the hospital with the increasing bilateral lower extremity edema elevated blood pressure headaches.  2 days prior to the admission patient was having upper back pain and chest pain on exertion and had followed up with primary care and was given antibiotics and Decadron for pneumonia and Covid test at the time was pending.  Given the worsening symptoms patient presents to the ER and was admitted under OB/GYN service for preeclampsia and magnesium infusion started.  Blood pressure control medications were placed.  Chest x-ray done in the hospital showed possible Covid with bilateral infiltrates but Covid PCR was negative.  On my exam 2 days ago when I was consulted patient was complaining of pleuritic type of chest pain and upper back pain given this pain there was concern for possible pulmonary embolism for which I ordered CT angiogram of the chest after discussing with Dr. Rosana Hoes OB/GYN.  CT angiogram of the chest came back negative but did show some features concerning for pleural effusion cardiomegaly and possible CHF.  Patient's labs showed at that time negative procalcitonin levels and high sensitive troponins were negative.  Subsequent which patient's symptoms have improved but started recurring again tonight and we were consulted again.  Tonight chest x-ray shows features concerning for pulmonary edema EKG shows no significant change from 2 days ago.  Labs show BNP of 713 high-sensitivity troponin of 27 hemoglobin 9.  On my exam patient appears mildly short of breath and patient states that symptoms worsen on lying flat.  Patient feels like when she lies flat something is crushing her now and she gets short of breath.  Patient also has bilateral lower extremity edema.  JVD not very appreciable.  Denies any  abdominal pain nausea vomiting or diarrhea.  No fever chills patient is not hypoxic.   Assessment & Plan:   Active Problems:   IIH (idiopathic intracranial hypertension)   S/P ventriculoperitoneal shunt   Severe preeclampsia, third trimester   Pneumonia   Status post ventriculoatrial shunt placement   Chest pain   #1.  Pleuritic type of chest pain with shortness of breath with peripheral edema and elevated BNP with chest cardiac markers and chest x-ray showing features concerning for CHF -at this time primary concern is for fluid overload for which I discussed with Dr. Rosana Hoes and advised to give Lasix 20 mg IV one-time dose if there is no contraindication and to see the response and will check 2D echo and get cardiology input.  Will need to closely follow metabolic panel blood pressure trends intake output Daily weights. #2.  Preeclampsia being managed by OB/GYN. #3.  Normocytic normochromic anemia per OB/GYN. #4.  Idiopathic intracranial hypertension status post VA shunt recently changed from VP shunt in November 2020 by neurosurgeon.  Patient also has history of intracranial stent for which patient takes aspirin.  Thank you for involving [place] in patient's care we will be following along with you.   DVT prophylaxis: Presently on TED hose and per OB/GYN. Code Status: Full code. Family Communication: Family at the bedside. Disposition Plan: Per OB/GYN.   Subjective: Chest pain and shortness of breath.  Objective: Vitals:   09/14/19 0355 09/14/19 0401 09/14/19 0411 09/14/19 0415  BP:  137/71    Pulse:  88    Resp: 18     Temp:  TempSrc:      SpO2: 93%  93% 94%  Weight:      Height:        Intake/Output Summary (Last 24 hours) at 09/14/2019 0420 Last data filed at 09/14/2019 0110 Gross per 24 hour  Intake 4221.78 ml  Output 1005 ml  Net 3216.78 ml   Filed Weights   09/11/19 1811 09/12/19 1445 09/13/19 0835  Weight: 132.9 kg 134.3 kg 133.6 kg    Examination:  General  exam: Appears calm and comfortable  Respiratory system: Clear to auscultation. Respiratory effort normal. Cardiovascular system: S1 & S2 heard, RRR. No JVD, murmurs, rubs, gallops or clicks. No pedal edema. Gastrointestinal system: Abdomen is nondistended, soft and nontender. No organomegaly or masses felt. Normal bowel sounds heard. Central nervous system: Alert and oriented. No focal neurological deficits. Extremities: Symmetric 5 x 5 power.  Bilateral lower extremity edema present. Skin: No rashes, lesions or ulcers Psychiatry: Judgement and insight appear normal. Mood & affect appropriate.     Data Reviewed: I have personally reviewed following labs and imaging studies  CBC: Recent Labs  Lab 09/11/19 1950 09/13/19 1846 09/14/19 0106  WBC 10.4 9.3 10.3  HGB 8.8* 9.0* 9.0*  HCT 27.4* 28.1* 27.7*  MCV 83.5 83.4 84.2  PLT 246 255 0000000   Basic Metabolic Panel: Recent Labs  Lab 09/11/19 1950 09/13/19 0752 09/13/19 1331 09/13/19 1846 09/14/19 0106  NA 135 137  --  131* 135  K 4.2 4.8  --  4.0 4.9  CL 108 109  --  104 106  CO2 17* 15*  --  18* 19*  GLUCOSE 86 143*  --  114* 106*  BUN 21* 26*  --  29* 27*  CREATININE 0.75 1.03*  --  0.92 0.97  CALCIUM 8.4* 8.6*  --  8.5* 8.2*  MG  --   --  3.1*  --  5.0*   GFR: Estimated Creatinine Clearance: 124 mL/min (by C-G formula based on SCr of 0.97 mg/dL). Liver Function Tests: Recent Labs  Lab 09/11/19 1950 09/13/19 0752 09/13/19 1846 09/14/19 0106  AST 20 20 19 17   ALT 18 16 15 14   ALKPHOS 52 53 58 53  BILITOT 0.2* 0.5 0.3 0.5  PROT 5.4* 5.0* 5.3* 5.2*  ALBUMIN 2.4* 2.3* 2.4* 2.4*   No results for input(s): LIPASE, AMYLASE in the last 168 hours. No results for input(s): AMMONIA in the last 168 hours. Coagulation Profile: No results for input(s): INR, PROTIME in the last 168 hours. Cardiac Enzymes: No results for input(s): CKTOTAL, CKMB, CKMBINDEX, TROPONINI in the last 168 hours. BNP (last 3 results) No results  for input(s): PROBNP in the last 8760 hours. HbA1C: No results for input(s): HGBA1C in the last 72 hours. CBG: No results for input(s): GLUCAP in the last 168 hours. Lipid Profile: No results for input(s): CHOL, HDL, LDLCALC, TRIG, CHOLHDL, LDLDIRECT in the last 72 hours. Thyroid Function Tests: No results for input(s): TSH, T4TOTAL, FREET4, T3FREE, THYROIDAB in the last 72 hours. Anemia Panel: No results for input(s): VITAMINB12, FOLATE, FERRITIN, TIBC, IRON, RETICCTPCT in the last 72 hours. Sepsis Labs: Recent Labs  Lab 09/12/19 0533  PROCALCITON <0.10    Recent Results (from the past 240 hour(s))  Respiratory Panel by RT PCR (Flu A&B, Covid) - Nasopharyngeal Swab     Status: None   Collection Time: 09/11/19  9:02 PM   Specimen: Nasopharyngeal Swab  Result Value Ref Range Status   SARS Coronavirus 2 by RT PCR NEGATIVE NEGATIVE  Final    Comment: (NOTE) SARS-CoV-2 target nucleic acids are NOT DETECTED. The SARS-CoV-2 RNA is generally detectable in upper respiratoy specimens during the acute phase of infection. The lowest concentration of SARS-CoV-2 viral copies this assay can detect is 131 copies/mL. A negative result does not preclude SARS-Cov-2 infection and should not be used as the sole basis for treatment or other patient management decisions. A negative result may occur with  improper specimen collection/handling, submission of specimen other than nasopharyngeal swab, presence of viral mutation(s) within the areas targeted by this assay, and inadequate number of viral copies (<131 copies/mL). A negative result must be combined with clinical observations, patient history, and epidemiological information. The expected result is Negative. Fact Sheet for Patients:  PinkCheek.be Fact Sheet for Healthcare Providers:  GravelBags.it This test is not yet ap proved or cleared by the [place] FDA and  has been  authorized for detection and/or diagnosis of SARS-CoV-2 by FDA under an Emergency Use Authorization (EUA). This EUA will remain  in effect (meaning this test can be used) for the duration of the COVID-19 declaration under Section 564(b)(1) of the Act, 21 U.S.C. section 360bbb-3(b)(1), unless the authorization is terminated or revoked sooner.    Influenza A by PCR NEGATIVE NEGATIVE Final   Influenza B by PCR NEGATIVE NEGATIVE Final    Comment: (NOTE) The Xpert Xpress SARS-CoV-2/FLU/RSV assay is intended as an aid in  the diagnosis of influenza from Nasopharyngeal swab specimens and  should not be used as a sole basis for treatment. Nasal washings and  aspirates are unacceptable for Xpert Xpress SARS-CoV-2/FLU/RSV  testing. Fact Sheet for Patients: PinkCheek.be Fact Sheet for Healthcare Providers: GravelBags.it This test is not yet approved or cleared by the [place] FDA and  has been authorized for detection and/or diagnosis of SARS-CoV-2 by  FDA under an Emergency Use Authorization (EUA). This EUA will remain  in effect (meaning this test can be used) for the duration of the  Covid-19 declaration under Section 564(b)(1) of the Act, 21  U.S.C. section 360bbb-3(b)(1), unless the authorization is  terminated or revoked. Performed at [facility], [place] [address], [place], [place] [address]          Radiology Studies: DG CHEST PORT 1 VIEW  Result Date: 09/14/2019 CLINICAL DATA:  Shortness of breath tonight. EXAM: PORTABLE CHEST 1 VIEW COMPARISON:  Chest CTA 09/12/2019, 2 days ago. FINDINGS: Shunt catheter tubing projects over the right neck and upper chest. Mild cardiomegaly is similar to prior. Small pleural effusions on CT are not well demonstrated radiographically. Minimal pulmonary edema. No pneumothorax. IMPRESSION: 1. Unchanged mild cardiomegaly. 2. Small pleural effusions on CT are not well demonstrated  radiographically. 3. Minimal pulmonary edema. Electronically Signed   By: Keith Rake M.D.   On: 09/14/2019 01:44        Scheduled Meds: . aspirin EC  81 mg Oral Daily  . docusate sodium  100 mg Oral Daily  . escitalopram  20 mg Oral Daily  . furosemide  20 mg Intravenous Once  . labetalol  600 mg Oral TID  . pantoprazole (PROTONIX) IV  40 mg Intravenous Q24H  . prenatal multivitamin  1 tablet Oral Q1200   Continuous Infusions: . sodium chloride 10 mL/hr at 09/13/19 0840  . ferumoxytol 510 mg (09/13/19 0842)     LOS: 3 days    Time spent: 45 minutes.    Rise Patience, MD [facility]  If 7PM-7AM, please contact night-coverage [email or web address] Password  [email or web address] 09/14/2019, 4:20 AM

## 2019-09-14 NOTE — Progress Notes (Signed)
Faculty Note  Alerted by RN that patient is having worsening chest pain, feels like "bricks on her chest" and worsening edema. Mag turned off at that point and labs ordered.  Patient reports she feels significant chest discomfort, particularly at her midline chest and discomfort with inspiration. She does report she feels better with the magnesium turned off. Reports she is having difficulty getting comfortable and could not lay flat with her chest discomfort worsening overnight.   Patient does have history of anxiety/depression, states she has had panic attacks before and this does not feel like a panic attack.  BP (!) 156/90   Pulse 84   Temp 98.2 F (36.8 C) (Oral)   Resp 18   Ht 5\' 8"  (1.727 m)   Wt 133.6 kg   LMP 02/23/2019 (Exact Date)   SpO2 96%   BMI 44.78 kg/m  Gen: alert, oriented, mildly uncomfortable CVS: RRR Resp: lungs clear to auscultation Ext: +2 pitting edema in lower limbs bilaterally  A/P: 29 yo G1P0 @ [redacted]w[redacted]d with severe pre-eclampsia, h/o IIH with VA shunt and worsening chest discomfort tonight. Mag discontinued with improvement in chest pain. BP improved on increased labetalol dose. Elevated BNP and mildly elevated troponin, unchanged CXR and EKG. Reviewed case with MFM who recommends hospitalist consult regarding chest pain as her BP is controlled and no worsening hepatic/renal function.    Feliz Beam, M.D. Attending Center for [employer] farm manager)

## 2019-09-14 NOTE — Progress Notes (Signed)
Placed on telemetry cardiac monitoring

## 2019-09-14 NOTE — Progress Notes (Signed)
Labor Progress Note KAMRY FARACI is a 29 y.o. G1P0000 at 52w0[name] S: Called to room as patient feeling tightness in chest and back. Patient believes this is due to Magnesium as this has happened with 2 prior doses of Magnesium. Reports discomfort with urinary catheter.   O:  BP (!) 168/90   Pulse 82   Temp 98.9 F (37.2 C) (Oral)   Resp (!) 22   Ht 5' 8"  (1.727 m)   Wt (!) 136.9 kg Comment: nurse notified  LMP 02/23/2019 (Exact Date)   SpO2 97%   BMI 45.88 kg/m  EFM: 145, minimal variability, no accels, no decels TOCO: none  CVE: Dilation: Closed Effacement (%): Thick Station: Ballotable Presentation: Vertex Exam by:: Dr FMarice Potter  A&P: 29y.o. G1P0000 24w0dere for IOL for [name]. #Labor: Vertex by BSUS. S/p Cytotec x1. Very posterior cervix and closed. Will attempt FB placement with speculum at next exam. Discussed indications for CS. Anticipate VD; CS as appropriate. NICU has met with patient. Will trial Atarax for discomfort which could possibly related to anxiety as well. #Pain: per patient request #FWB: Cat II #GBS unknown; PCN ordered #[name]: Severe-range BP's and pulmonary edema. Cont Lasix IV 20 mg q4h for 4 doses and then PRN. Currently with 1-2+ pitting edema from waist down. On 2L [place]. Labetalol protocol ordered. Will run NS at 10 mL/hr and Mag at 1 g/hr (25 mL/hr). For first dose of PCN, patient will receive 250 mL over first hour but after this dose will be at 25 mL/hr for a total of 60 mL/hr IVF. Will place Foley for strict I's/O's. Echo results reviewed and largely WNL; some LVH. CMP/CBC q8h. Mag level q12h. Tele leads in place due to IV diuresis. Output adequate currently.  #IIH: Cont low dose ASA. 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  Patient also has history of intracranial stent for which patient takes aspirin. MOC: Desires interval BTL; possibly Depo during  interval MOF: Breast  ChChauncey MannMD 2:05 PM

## 2019-09-14 NOTE — Progress Notes (Signed)
Magnesium stopped pt c/o pain at site of iv.  Anesthesia CRNA called and for request for anesthesia to start IV due to tissue swelling

## 2019-09-14 NOTE — Progress Notes (Signed)
Labor Progress Note Maria Payne is a 29 y.o. G1P0000 at [redacted]w[redacted]d presented for IOL from [facility] specialty for [name]. S: Patient tearful and feeling anxious. Reports feeling tired and that "I can't do it."  O:  BP 131/75   Pulse 74   Temp 98.1 F (36.7 C) (Oral)   Resp (!) 24   Ht 5\' 8"  (1.727 m)   Wt (!) 136.9 kg Comment: nurse notified  LMP 02/23/2019 (Exact Date)   SpO2 95%   BMI 45.88 kg/m  EFM: 145, minimal to moderate variability, no accels, no decels TOCO: none  CVE: Dilation: Closed Effacement (%): Thick Station: Ballotable Presentation: Vertex Exam by:: Dr Marice Potter   A&P: 29 y.o. G1P0000 [redacted]w[redacted]d here for IOL for [name]. #Labor: Vertex by BSUS. S/p Cytotec x2. Patient unable to tolerate foley balloon placement with speculum; will try again after epidural which patient would like now. Anticipate VD; CS as appropriate.  #Pain: per patient request #FWB: Cat II #GBS unknown; PCN ordered #[name]: Severe-range BP's and pulmonary edema. Cont Lasix IV 20 mg q4h for 4 doses and then PRN. Currently with 1-2+ pitting edema from waist down. On 2L [place]. Labetalol protocol ordered. Will run NS at 10 mL/hr and Mag at 1 g/hr (25 mL/hr). For first dose of PCN, patient will receive 250 mL over first hour but after this dose will be at 25 mL/hr for a total of 60 mL/hr IVF. Will place Foley for strict I's/O's. Echo results reviewed and largely WNL; some LVH. CMP/CBC q8h. Mag level q12h. Tele leads in place due to IV diuresis. Output adequate currently.  #IIH: Cont low dose ASA. 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  Patient also has history of intracranial stent for which patient takes aspirin. MOC: Desires interval BTL; possibly Depo during interval MOF: Breast  [name], MD 4:38 PM

## 2019-09-14 NOTE — Progress Notes (Signed)
[id number] pt getting out of bed and into chair

## 2019-09-14 NOTE — Progress Notes (Signed)
Pt turned to right side at 1522 difficult to obtgain fhr tracing  Cardio readjusted until fhr could be heard at 1529

## 2019-09-14 NOTE — Progress Notes (Signed)
Dr Marice Potter attempting to place foley bulb with vaginal speculum.  fhr not tracing

## 2019-09-14 NOTE — Progress Notes (Signed)
  Echocardiogram 2D Echocardiogram has been performed.  Maria Payne 09/14/2019, 10:37 AM

## 2019-09-14 NOTE — Anesthesia Procedure Notes (Signed)
Epidural Patient location during procedure: OB Start time: 09/14/2019 4:54 PM End time: 09/14/2019 4:57 PM  Staffing Anesthesiologist: Brennan Bailey, MD Performed: anesthesiologist   Preanesthetic Checklist Completed: patient identified, IV checked, risks and benefits discussed, monitors and equipment checked, pre-op evaluation and timeout performed  Epidural Patient position: sitting Prep: DuraPrep and site prepped and draped Patient monitoring: continuous pulse ox, blood pressure and heart rate Approach: midline Location: L3-L4 Injection technique: LOR air  Needle:  Needle type: Tuohy  Needle gauge: 17 G Needle length: 9 cm Needle insertion depth: 6 cm Catheter type: closed end flexible Catheter size: 19 Gauge Catheter at skin depth: 11 cm Test dose: negative and Other (1% lidocaine)  Assessment Events: blood not aspirated, injection not painful, no injection resistance, no paresthesia and negative IV test  Additional Notes Patient identified. Risks, benefits, and alternatives discussed with patient including but not limited to bleeding, infection, nerve damage, paralysis, failed block, incomplete pain control, headache, blood pressure changes, nausea, vomiting, reactions to medication, itching, and postpartum back pain. Confirmed with bedside nurse the patient's most recent platelet count. Confirmed with patient that they are not currently taking any anticoagulation, have any bleeding history, or any family history of bleeding disorders. Patient expressed understanding and wished to proceed. All questions were answered. Sterile technique was used throughout the entire procedure. Please see nursing notes for vital signs.   Crisp LOR on first pass. Test dose was given through epidural catheter and negative prior to continuing to dose epidural or start infusion. Warning signs of high block given to the patient including shortness of breath, tingling/numbness in hands, complete  motor block, or any concerning symptoms with instructions to call for help. Patient was given instructions on fall risk and not to get out of bed. All questions and concerns addressed with instructions to call with any issues or inadequate analgesia.  Reason for block:procedure for pain

## 2019-09-14 NOTE — Progress Notes (Signed)
Patient ID: Maria Payne, female   DOB: 08-Jan-1991, 29 y.o.   MRN: [id number] [place]) NOTE  Maria Payne is a 29 y.o. G1P0000 at [redacted]w[redacted]d by best clinical estimate who is admitted for pre-eclampsia with severe features.   Fetal presentation is cephalic. Length of Stay:  3  Days  ASSESSMENT: Active Problems:   IIH (idiopathic intracranial hypertension)   S/P ventriculoperitoneal shunt   Severe preeclampsia, third trimester   Pneumonia   Status post ventriculoatrial shunt placement   Chest pain  PLAN: Given worsening WOB, pulmonary congestion, elevated serum Cr as signs of pre-eclampsia with severe features, will move to delivery. Discussed magnesium, will hold bolus and run at low dose. Strict I/O's. Lasix prn. PCN for GBS ppx F/u ECHO results Discussed with NICU  Subjective: Feels horrible. Has gained 20 lbs since admission. Up 4L since yesterday. Still SOB. S/p Lasix x 1. BP is still poorly controlled. Patient reports the fetal movement as active. Patient reports uterine contraction  activity as none. Patient reports  vaginal bleeding as none. Patient describes fluid per vagina as None.  Vitals:  Blood pressure (!) 153/93, pulse 80, temperature 97.6 F (36.4 C), temperature source Oral, resp. rate 15, height 5\' 8"  (1.727 m), weight (!) 136.9 kg, last menstrual period 02/23/2019, SpO2 98 %. Physical Examination:  General appearance - alert, some increase WOB Chest - normal effort, diminished in bases Abdomen - gravid, non-tender Fundal Height:  size equals dates Extremities: edema 3-4+ edema to top of thighs  Neuro: DTRs 3+ and brisk, no clonus Membranes:intact  Fetal Monitoring:  Baseline: 125 bpm, Variability: Good {> 6 bpm), Accelerations: Reactive and Decelerations: Absent  Labs:  Results for orders placed or performed during the hospital encounter of 09/11/19 (from the past 24 hour(s))  Urinalysis, Routine w reflex microscopic   Collection Time: 09/13/19 10:20 AM  Result Value Ref Range   Color, Urine YELLOW YELLOW   APPearance HAZY (A) CLEAR   Specific Gravity, Urine 1.043 (H) 1.005 - 1.030   pH 5.0 5.0 - 8.0   Glucose, UA 50 (A) NEGATIVE mg/dL   Hgb urine dipstick SMALL (A) NEGATIVE   Bilirubin Urine NEGATIVE NEGATIVE   Ketones, ur NEGATIVE NEGATIVE mg/dL   Protein, ur >=300 (A) NEGATIVE mg/dL   Nitrite NEGATIVE NEGATIVE   Leukocytes,Ua NEGATIVE NEGATIVE   RBC / HPF 11-20 0 - 5 RBC/hpf   WBC, UA 11-20 0 - 5 WBC/hpf   Bacteria, UA RARE (A) NONE SEEN   Squamous Epithelial / LPF 6-10 0 - 5   Mucus PRESENT    Budding Yeast PRESENT    Hyaline Casts, UA PRESENT   Magnesium   Collection Time: 09/13/19  1:31 PM  Result Value Ref Range   Magnesium 3.1 (H) 1.7 - 2.4 mg/dL  Comprehensive metabolic panel   Collection Time: 09/13/19  6:46 PM  Result Value Ref Range   Sodium 131 (L) 135 - 145 mmol/L   Potassium 4.0 3.5 - 5.1 mmol/L   Chloride 104 98 - 111 mmol/L   CO2 18 (L) 22 - 32 mmol/L   Glucose, Bld 114 (H) 70 - 99 mg/dL   BUN 29 (H) 6 - 20 mg/dL   Creatinine, Ser 0.92 0.44 - 1.00 mg/dL   Calcium 8.5 (L) 8.9 - 10.3 mg/dL   Total Protein 5.3 (L) 6.5 - 8.1 g/dL   Albumin 2.4 (L) 3.5 - 5.0 g/dL   AST 19 15 - 41 U/L   ALT 15 0 -  44 U/L   Alkaline Phosphatase 58 38 - 126 U/L   Total Bilirubin 0.3 0.3 - 1.2 mg/dL   GFR calc non Af Amer >60 >60 mL/min   GFR calc Af Amer >60 >60 mL/min   Anion gap 9 5 - 15  CBC   Collection Time: 09/13/19  6:46 PM  Result Value Ref Range   WBC 9.3 4.0 - 10.5 K/uL   RBC 3.37 (L) 3.87 - 5.11 MIL/uL   Hemoglobin 9.0 (L) 12.0 - 15.0 g/dL   HCT 28.1 (L) 36.0 - 46.0 %   MCV 83.4 80.0 - 100.0 fL   MCH 26.7 26.0 - 34.0 pg   MCHC 32.0 30.0 - 36.0 g/dL   RDW 13.4 11.5 - 15.5 %   Platelets 255 150 - 400 K/uL   nRBC 0.0 0.0 - 0.2 %  Type and screen [place]   Collection Time: 09/13/19  6:49 PM  Result Value Ref Range   ABO/RH(D) A POS    Antibody Screen  NEG    Sample Expiration      09/16/2019,2359 Performed at [facility], [place] [address], [place], [place] [address]   Brain natriuretic peptide   Collection Time: 09/14/19  1:06 AM  Result Value Ref Range   B Natriuretic Peptide 713.4 (H) 0.0 - 100.0 pg/mL  Comprehensive metabolic panel   Collection Time: 09/14/19  1:06 AM  Result Value Ref Range   Sodium 135 135 - 145 mmol/L   Potassium 4.9 3.5 - 5.1 mmol/L   Chloride 106 98 - 111 mmol/L   CO2 19 (L) 22 - 32 mmol/L   Glucose, Bld 106 (H) 70 - 99 mg/dL   BUN 27 (H) 6 - 20 mg/dL   Creatinine, Ser 0.97 0.44 - 1.00 mg/dL   Calcium 8.2 (L) 8.9 - 10.3 mg/dL   Total Protein 5.2 (L) 6.5 - 8.1 g/dL   Albumin 2.4 (L) 3.5 - 5.0 g/dL   AST 17 15 - 41 U/L   ALT 14 0 - 44 U/L   Alkaline Phosphatase 53 38 - 126 U/L   Total Bilirubin 0.5 0.3 - 1.2 mg/dL   GFR calc non Af Amer >60 >60 mL/min   GFR calc Af Amer >60 >60 mL/min   Anion gap 10 5 - 15  Magnesium   Collection Time: 09/14/19  1:06 AM  Result Value Ref Range   Magnesium 5.0 (H) 1.7 - 2.4 mg/dL  CBC   Collection Time: 09/14/19  1:06 AM  Result Value Ref Range   WBC 10.3 4.0 - 10.5 K/uL   RBC 3.29 (L) 3.87 - 5.11 MIL/uL   Hemoglobin 9.0 (L) 12.0 - 15.0 g/dL   HCT 27.7 (L) 36.0 - 46.0 %   MCV 84.2 80.0 - 100.0 fL   MCH 27.4 26.0 - 34.0 pg   MCHC 32.5 30.0 - 36.0 g/dL   RDW 13.3 11.5 - 15.5 %   Platelets 261 150 - 400 K/uL   nRBC 0.0 0.0 - 0.2 %  Troponin I (High Sensitivity)   Collection Time: 09/14/19  1:06 AM  Result Value Ref Range   Troponin I (High Sensitivity) 27 (H) <18 ng/L  Troponin I (High Sensitivity)   Collection Time: 09/14/19  6:41 AM  Result Value Ref Range   Troponin I (High Sensitivity) 25 (H) <18 ng/L  CBC   Collection Time: 09/14/19  8:49 AM  Result Value Ref Range   WBC 8.7 4.0 - 10.5 K/uL  RBC 3.03 (L) 3.87 - 5.11 MIL/uL   Hemoglobin 8.2 (L) 12.0 - 15.0 g/dL   HCT 25.7 (L) 36.0 - 46.0 %   MCV 84.8 80.0 - 100.0 fL   MCH 27.1 26.0 - 34.0  pg   MCHC 31.9 30.0 - 36.0 g/dL   RDW 13.4 11.5 - 15.5 %   Platelets 244 150 - 400 K/uL   nRBC 0.0 0.0 - 0.2 %  ECHOCARDIOGRAM COMPLETE   Collection Time: 09/14/19  9:43 AM  Result Value Ref Range   Weight 4,828 oz   Height 68 in   BP 153/93 mmHg   Medications:  Scheduled . aspirin EC  81 mg Oral Daily  . docusate sodium  100 mg Oral Daily  . escitalopram  20 mg Oral Daily  . labetalol  600 mg Oral TID  . pantoprazole (PROTONIX) IV  40 mg Intravenous Q24H  . prenatal multivitamin  1 tablet Oral Q1200   I have reviewed the patient's current medications.   Donnamae Jude, MD 09/14/2019,9:57 AM

## 2019-09-14 NOTE — Progress Notes (Addendum)
Maria Payne is a 29 y.o. G1P0000 at [redacted]w[redacted]d presented for IOL from [facility] specialty for [name].  Subjective: Currently doing well.  Comfortable with epidural. Complaining of occasional wheezes.  Objective: BP (!) 143/84   Pulse 79   Temp 98.4 F (36.9 C) (Oral)   Resp 18   Ht 5\' 8"  (1.727 m)   Wt (!) 136.9 kg Comment: nurse notified  LMP 02/23/2019 (Exact Date)   SpO2 95%   BMI 45.88 kg/m  I/O last 3 completed shifts: In: 5226.7 [P.O.:3710; I.V.:1166.7; IV Piggyback:350] Out: 2978 [Urine:2978] Total I/O In: 69.9 [I.V.:69.9] Out: 300 [Urine:300]   Net IO Since Admission: 2,860.78 mL [09/14/19 2116]   General: appears tired, lying in bed, with head of bed at 45'. Pitting dependent edema up to waist.  Lungs: Diminished breath sounds on the right. FHT:  FHR: 140 bpm, variability: min-mod,  accelerations:  Abscent,  decelerations:  Absent UC:   none SVE:   Dilation: Closed Effacement (%): Thick Station: Ballotable Exam by:: Dr. Marice Potter  Labs: Lab Results  Component Value Date   WBC 8.4 09/14/2019   HGB 8.8 (L) 09/14/2019   HCT 27.2 (L) 09/14/2019   MCV 83.7 09/14/2019   PLT 254 09/14/2019   Assessment / Plan: Induction of labor due to Severe PE,  progressing well on pitocin  Labor: Cytotec x3, last dose 2049. Preeclampsia: Currently on 1 g of magnesium per hour given precarious fluid balance.  Measuring mag levels every 12 hours.  CMP is every 8 hours.  Patient has been through labetalol protocol once today.  She is currently on labetalol 200 mg twice daily. Pulmonary edema likely secondary to volume overload.  Patient is positive 4 liters as of this morning for this hospital stay.  At this time, she is 2.8 L positive.  She has received Lasix and diuresed about 1 L today.  She has a 20 mg dose at 10:00 PM and 02:00 AM.  Will monitor urine output to ensure proper diuresis.  If low urine output can consider adding more scheduled doses.  Patient BP ranging from  123-178/67-100. Last value 143/84.  IIH: Cont low dose ASA. 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 Patient also has history of intracranial stent for which patient takes aspirin. MOC: Desires interval BTL; possibly Depo during interval Fetal Wellbeing:  Category II, reassuring for occasional moderate variability Pain Control:  IV pain meds I/D:  GBS unknown, PCN ordered Anticipated MOD:  NSVD  Wilber Oliphant 09/14/2019, 9:11 PM  GME ATTESTATION:  I saw and evaluated the patient. I agree with the findings and the plan of care as documented in the resident's note.  Merilyn Baba, DO OB Fellow [place] for [employer]

## 2019-09-14 NOTE — Anesthesia Preprocedure Evaluation (Signed)
Anesthesia Evaluation  Patient identified by MRN, date of birth, ID band Patient awake    Reviewed: Allergy & Precautions, Patient's Chart, lab work & pertinent test results  History of Anesthesia Complications Negative for: history of anesthetic complications  Airway Mallampati: II  TM Distance: >3 FB Neck ROM: Full    Dental no notable dental hx.    Pulmonary shortness of breath and lying, asthma ,    Pulmonary exam normal        Cardiovascular Normal cardiovascular exam  TTE 09/13/18: normal EF, LVH noted, moderately elevated PASP   Neuro/Psych Anxiety Depression Idiopathic intracranial HTN s/p multiple venticular shunts (L-P, V-P, now with V-Atrial shunt placed this year)    GI/Hepatic negative GI ROS, Neg liver ROS,   Endo/Other  negative endocrine ROS  Renal/GU negative Renal ROS  negative genitourinary   Musculoskeletal negative musculoskeletal ROS (+)   Abdominal   Peds  Hematology negative hematology ROS (+)   Anesthesia Other Findings Day of surgery medications reviewed with patient.  Reproductive/Obstetrics (+) Pregnancy (Severe preE on Mg)                             Anesthesia Physical Anesthesia Plan  ASA: III  Anesthesia Plan: Epidural   Post-op Pain Management:    Induction:   PONV Risk Score and Plan: Treatment may vary due to age or medical condition  Airway Management Planned: Natural Airway  Additional Equipment:   Intra-op Plan:   Post-operative Plan:   Informed Consent: I have reviewed the patients History and Physical, chart, labs and discussed the procedure including the risks, benefits and alternatives for the proposed anesthesia with the patient or authorized representative who has indicated his/her understanding and acceptance.       Plan Discussed with:   Anesthesia Plan Comments:         Anesthesia Quick Evaluation

## 2019-09-14 NOTE — Progress Notes (Signed)
TRH Sign off note  I briefly reviewed patient's chart and went by to [facility] to see the patient at ~ 10am. I met Dr. Darron Doom outside the patient's room and she advised me that she will be taking the patient to delivery right then. She advised that [facility] assistance wasn't needed any longer and TRH can signoff at this time. She planned to consult Cardiology after Echo results..   Please re consult TRH for any further assistance.  Vernell Leep, MD, [place], [facility]. [facility]  To contact the attending provider between 7A-7P or the covering provider during after hours 7P-7A, please log into the web site [email or web address] and access using universal [place] password for that web site. If you do not have the password, please call the hospital operator.

## 2019-09-14 NOTE — Progress Notes (Signed)
Referral from pt's father who is a Theme park manager in [place].  INitial visit with Audery and Thedore Mins.  Koralee shared that it has been a difficult three days and she is weary as she begins induction of her daughter, Cheri Guppy, who is 29 weeks.  Galyn shared that she hasn't been able to get any rest during these long days because she is overcome with panic when she starts to doze off.  She finds medication helpful, but hasn't found any other coping techniques.  She has experience with anxiety in the past, but does not have many coping strategies beyond medication-we discussed the challenge of building new strategies in the midst of labor.  Thedore Mins acknowledged some worry for his wife and daughter.  Pt shared her concerns and I facilitated some processing of emotion.  Visit concluded with prayer at patient's agreement.   Please page as further needs arise.  Donald Prose. Elyn Peers, M.Div. [facility] Chaplain Pager [phone] Office [phone]

## 2019-09-14 NOTE — Progress Notes (Signed)
Pt c/o head hurting, chest feels like a lot of pressure. Dr Marice Potter called to room.  Pt assisted back to bed Atarax 50 mg po given

## 2019-09-14 NOTE — Progress Notes (Signed)
Instructed by Dr Birdie Sons to hold Labetalol for now with epidural in progress

## 2019-09-14 NOTE — Consult Note (Signed)
Neonatology Antenatal Consultation: Reason: severe-pre-eclampsia, [redacted] weeks EGA  Dr. Patterson Hammersmith met with Ms. Boxell yesterday however the father was unable to attend.  I therefore met with Ms. Wingard and FOB today to answer his questions.  We discussed delivery room resuscitation, including intubation and surfactant in DR.  Discussed mechanical ventilation, risk for IVH with potential for motor / cognitive deficits, feeding immaturity.  Discussed NG / OG feeds, benefits of MBM in reducing incidence of NEC.   Discussed visitation and likely length of stay. Thank you for allowing [place] to participate in her care.   Higinio Roger, DO  Neonatologist

## 2019-09-14 NOTE — Progress Notes (Signed)
SCDs applied.

## 2019-09-14 NOTE — Progress Notes (Addendum)
Labor Progress Note Maria Payne is a 29 y.o. G1P0000 at 38w0dp[name] S: Met patient and explained induction process; answered all questions. FOB at bedside and engaged.  O:  BP (!) 160/90   Pulse 87   Temp 98.9 F (37.2 C) (Oral)   Resp (!) 24   Ht _0  (1.727 m)   Wt (!) 136.9 kg Comment: nurse notified  LMP 02/23/2019 (Exact Date)   SpO2 94%   BMI 45.88 kg/m  EFM: 145, minimal variability, no accels, no decels TOCO: none  CVE: Dilation: Closed Effacement (%): Thick Station: Ballotable Presentation: Vertex Exam by:: Dr FMarice Potter  A&P: 29y.o. G1P0000 26w0dere for IOL for [name]. #Labor: Vertex by BSUS. Cytotec given. Very posterior cervix and closed. Will attempt FB placement with speculum at next exam. Discussed indications for CS. Anticipate VD; CS as appropriate. NICU has met with patient.  #Pain: per patient request #FWB: Cat II for minimal variability #GBS unknown; PCN ordered #[name]: Severe-range BP's and pulmonary edema. Cont Lasix IV 20 mg q4h for 4 doses and then PRN. Currently with 1-2+ pitting edema from waist down. On 2L [place]. Labetalol protocol ordered. Will run NS at 10 mL/hr and Mag at 1 g/hr (25 mL/hr). For first dose of PCN, patient will receive 250 mL over first hour but after this dose will be at 25 mL/hr for a total of 60 mL/hr IVF. Will place Foley for strict I's/O's. Echo results pending. CMP/CBC q8h. Mag level q12h. Tele leads in place due to IV diuresis.  #IIH: Cont low dose ASA. 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  Patient also has history of intracranial stent for which patient takes aspirin. MOC: Desires interval BTL; possibly Depo during interval MOF: Breast  ChChauncey MannMD 12:25 PM

## 2019-09-15 ENCOUNTER — Encounter: Payer: Medicaid Other | Admitting: Certified Nurse Midwife

## 2019-09-15 ENCOUNTER — Encounter (HOSPITAL_COMMUNITY): Payer: Self-pay | Admitting: Obstetrics and Gynecology

## 2019-09-15 DIAGNOSIS — Z3A29 29 weeks gestation of pregnancy: Secondary | ICD-10-CM

## 2019-09-15 DIAGNOSIS — O1414 Severe pre-eclampsia complicating childbirth: Secondary | ICD-10-CM

## 2019-09-15 LAB — DIC (DISSEMINATED INTRAVASCULAR COAGULATION)PANEL
D-Dimer, Quant: 3.93 ug/mL-FEU — ABNORMAL HIGH (ref 0.00–0.50)
Fibrinogen: 549 mg/dL — ABNORMAL HIGH (ref 210–475)
INR: 1.1 (ref 0.8–1.2)
Platelets: 288 10*3/uL (ref 150–400)
Prothrombin Time: 13.6 seconds (ref 11.4–15.2)
Smear Review: NONE SEEN
aPTT: 27 seconds (ref 24–36)

## 2019-09-15 LAB — CBC
HCT: 29.2 % — ABNORMAL LOW (ref 36.0–46.0)
HCT: 29.6 % — ABNORMAL LOW (ref 36.0–46.0)
HCT: 26.5 % — ABNORMAL LOW (ref 36.0–46.0)
Hemoglobin: 9.7 g/dL — ABNORMAL LOW (ref 12.0–15.0)
Hemoglobin: 9.5 g/dL — ABNORMAL LOW (ref 12.0–15.0)
Hemoglobin: 8.8 g/dL — ABNORMAL LOW (ref 12.0–15.0)
MCH: 27.9 pg (ref 26.0–34.0)
MCH: 27.3 pg (ref 26.0–34.0)
MCH: 27.4 pg (ref 26.0–34.0)
MCHC: 33.2 g/dL (ref 30.0–36.0)
MCHC: 32.1 g/dL (ref 30.0–36.0)
MCHC: 33.2 g/dL (ref 30.0–36.0)
MCV: 83.9 fL (ref 80.0–100.0)
MCV: 85.1 fL (ref 80.0–100.0)
MCV: 82.6 fL (ref 80.0–100.0)
Platelets: 280 10*3/uL (ref 150–400)
Platelets: 283 10*3/uL (ref 150–400)
Platelets: 246 10*3/uL (ref 150–400)
RBC: 3.48 MIL/uL — ABNORMAL LOW (ref 3.87–5.11)
RBC: 3.48 MIL/uL — ABNORMAL LOW (ref 3.87–5.11)
RBC: 3.21 MIL/uL — ABNORMAL LOW (ref 3.87–5.11)
RDW: 13.7 % (ref 11.5–15.5)
RDW: 13.8 % (ref 11.5–15.5)
RDW: 13.4 % (ref 11.5–15.5)
WBC: 12.2 10*3/uL — ABNORMAL HIGH (ref 4.0–10.5)
WBC: 13.9 10*3/uL — ABNORMAL HIGH (ref 4.0–10.5)
WBC: 15.5 10*3/uL — ABNORMAL HIGH (ref 4.0–10.5)
nRBC: 0 % (ref 0.0–0.2)
nRBC: 0 % (ref 0.0–0.2)
nRBC: 0 % (ref 0.0–0.2)

## 2019-09-15 LAB — COMPREHENSIVE METABOLIC PANEL
ALT: 17 U/L (ref 0–44)
ALT: 18 U/L (ref 0–44)
ALT: 17 U/L (ref 0–44)
AST: 23 U/L (ref 15–41)
AST: 24 U/L (ref 15–41)
AST: 22 U/L (ref 15–41)
Albumin: 2.3 g/dL — ABNORMAL LOW (ref 3.5–5.0)
Albumin: 2.3 g/dL — ABNORMAL LOW (ref 3.5–5.0)
Albumin: 2.1 g/dL — ABNORMAL LOW (ref 3.5–5.0)
Alkaline Phosphatase: 60 U/L (ref 38–126)
Alkaline Phosphatase: 64 U/L (ref 38–126)
Alkaline Phosphatase: 61 U/L (ref 38–126)
Anion gap: 10 (ref 5–15)
Anion gap: 9 (ref 5–15)
Anion gap: 10 (ref 5–15)
BUN: 25 mg/dL — ABNORMAL HIGH (ref 6–20)
BUN: 24 mg/dL — ABNORMAL HIGH (ref 6–20)
BUN: 23 mg/dL — ABNORMAL HIGH (ref 6–20)
CO2: 20 mmol/L — ABNORMAL LOW (ref 22–32)
CO2: 22 mmol/L (ref 22–32)
CO2: 23 mmol/L (ref 22–32)
Calcium: 7.7 mg/dL — ABNORMAL LOW (ref 8.9–10.3)
Calcium: 7.6 mg/dL — ABNORMAL LOW (ref 8.9–10.3)
Calcium: 7.3 mg/dL — ABNORMAL LOW (ref 8.9–10.3)
Chloride: 105 mmol/L (ref 98–111)
Chloride: 105 mmol/L (ref 98–111)
Chloride: 104 mmol/L (ref 98–111)
Creatinine, Ser: 0.89 mg/dL (ref 0.44–1.00)
Creatinine, Ser: 0.94 mg/dL (ref 0.44–1.00)
Creatinine, Ser: 0.91 mg/dL (ref 0.44–1.00)
GFR calc Af Amer: >60 mL/min (ref >60)
GFR calc Af Amer: >60 mL/min (ref >60)
GFR calc Af Amer: >60 mL/min (ref >60)
GFR calc non Af Amer: >60 mL/min (ref >60)
GFR calc non Af Amer: >60 mL/min (ref >60)
GFR calc non Af Amer: >60 mL/min (ref >60)
Glucose, Bld: 93 mg/dL (ref 70–99)
Glucose, Bld: 95 mg/dL (ref 70–99)
Glucose, Bld: 130 mg/dL — ABNORMAL HIGH (ref 70–99)
Potassium: 4.5 mmol/L (ref 3.5–5.1)
Potassium: 4.6 mmol/L (ref 3.5–5.1)
Potassium: 4.6 mmol/L (ref 3.5–5.1)
Sodium: 135 mmol/L (ref 135–145)
Sodium: 136 mmol/L (ref 135–145)
Sodium: 137 mmol/L (ref 135–145)
Total Bilirubin: 0.3 mg/dL (ref 0.3–1.2)
Total Bilirubin: 0.6 mg/dL (ref 0.3–1.2)
Total Bilirubin: 0.4 mg/dL (ref 0.3–1.2)
Total Protein: 5 g/dL — ABNORMAL LOW (ref 6.5–8.1)
Total Protein: 5.2 g/dL — ABNORMAL LOW (ref 6.5–8.1)
Total Protein: 5 g/dL — ABNORMAL LOW (ref 6.5–8.1)

## 2019-09-15 LAB — CULTURE, OB URINE

## 2019-09-15 LAB — SPECIMEN STATUS REPORT

## 2019-09-15 LAB — NOVEL CORONAVIRUS, NAA: SARS-CoV-2, NAA: NOT DETECTED

## 2019-09-15 LAB — MAGNESIUM: Magnesium: 4.5 mg/dL — ABNORMAL HIGH (ref 1.7–2.4)

## 2019-09-15 LAB — PREPARE RBC (CROSSMATCH)

## 2019-09-15 LAB — RPR: RPR Ser Ql: NONREACTIVE

## 2019-09-15 MED ORDER — DIBUCAINE (PERIANAL) 1 % EX OINT: 1.0000 "application " | TOPICAL_OINTMENT | CUTANEOUS | Status: DC | PRN

## 2019-09-15 MED ORDER — ONDANSETRON HCL 4 MG/2ML IJ SOLN: 4.0000 mg | INTRAMUSCULAR | Status: DC | PRN

## 2019-09-15 MED ORDER — TRANEXAMIC ACID-NACL 1000-0.7 MG/100ML-% IV SOLN
INTRAVENOUS | Status: AC
  Filled 2019-09-15: qty 100

## 2019-09-15 MED ORDER — ACETAMINOPHEN 325 MG PO TABS
650.0000 mg | ORAL_TABLET | Freq: Four times a day (QID) | ORAL | Status: DC | PRN
  Administered 2019-09-16 – 2019-09-17 (×5): 650 mg via ORAL
  Filled 2019-09-15 (×5): qty 2

## 2019-09-15 MED ORDER — PRENATAL MULTIVITAMIN CH
1.0000 | ORAL_TABLET | Freq: Every day | ORAL | Status: DC
  Administered 2019-09-16 – 2019-09-18 (×3): 1 via ORAL
  Filled 2019-09-15 (×3): qty 1

## 2019-09-15 MED ORDER — LACTATED RINGERS AMNIOINFUSION: INTRAVENOUS | Status: DC

## 2019-09-15 MED ORDER — OXYTOCIN 40 UNITS IN NORMAL SALINE INFUSION - SIMPLE MED
1.0000 m[IU]/min | INTRAVENOUS | Status: DC
  Administered 2019-09-15: 11:00:00 2 m[IU]/min via INTRAVENOUS
  Filled 2019-09-15: qty 1000

## 2019-09-15 MED ORDER — MISOPROSTOL 25 MCG QUARTER TABLET
ORAL_TABLET | ORAL | Status: AC
  Filled 2019-09-15: qty 1

## 2019-09-15 MED ORDER — FUROSEMIDE 10 MG/ML IJ SOLN: 20.0000 mg | INTRAMUSCULAR | Status: DC

## 2019-09-15 MED ORDER — DIPHENHYDRAMINE HCL 25 MG PO CAPS: 25.0000 mg | ORAL_CAPSULE | Freq: Four times a day (QID) | ORAL | Status: DC | PRN

## 2019-09-15 MED ORDER — ALBUTEROL SULFATE (2.5 MG/3ML) 0.083% IN NEBU: 2.0000 mL | INHALATION_SOLUTION | RESPIRATORY_TRACT | Status: DC | PRN

## 2019-09-15 MED ORDER — ALBUTEROL SULFATE (2.5 MG/3ML) 0.083% IN NEBU: 3.0000 mL | INHALATION_SOLUTION | RESPIRATORY_TRACT | Status: DC | PRN

## 2019-09-15 MED ORDER — FUROSEMIDE 10 MG/ML IJ SOLN
20.0000 mg | Freq: Once | INTRAMUSCULAR | Status: AC
  Administered 2019-09-15: 08:00:00 20 mg via INTRAVENOUS
  Filled 2019-09-15: qty 2

## 2019-09-15 MED ORDER — ONDANSETRON HCL 4 MG PO TABS: 4.0000 mg | ORAL_TABLET | ORAL | Status: DC | PRN

## 2019-09-15 MED ORDER — FUROSEMIDE 10 MG/ML IJ SOLN
20.0000 mg | Freq: Every day | INTRAMUSCULAR | Status: AC
  Administered 2019-09-16: 11:00:00 20 mg via INTRAVENOUS
  Filled 2019-09-15: qty 2

## 2019-09-15 MED ORDER — SENNOSIDES-DOCUSATE SODIUM 8.6-50 MG PO TABS
2.0000 | ORAL_TABLET | ORAL | Status: DC
  Administered 2019-09-15 – 2019-09-19 (×2): 2 via ORAL
  Filled 2019-09-15 (×2): qty 2

## 2019-09-15 MED ORDER — FENTANYL CITRATE (PF) 100 MCG/2ML IJ SOLN
INTRAMUSCULAR | Status: DC | PRN
  Administered 2019-09-15 (×2): 50 ug via INTRAVENOUS

## 2019-09-15 MED ORDER — WITCH HAZEL-GLYCERIN EX PADS: 1.0000 "application " | MEDICATED_PAD | CUTANEOUS | Status: DC | PRN

## 2019-09-15 MED ORDER — BUPIVACAINE HCL (PF) 0.25 % IJ SOLN
INTRAMUSCULAR | Status: DC | PRN
  Administered 2019-09-15 (×2): 4 mL via EPIDURAL

## 2019-09-15 MED ORDER — MISOPROSTOL 200 MCG PO TABS
ORAL_TABLET | ORAL | Status: AC
  Filled 2019-09-15: qty 4

## 2019-09-15 MED ORDER — FUROSEMIDE 10 MG/ML IJ SOLN
20.0000 mg | INTRAMUSCULAR | Status: AC
  Administered 2019-09-15 (×2): 20 mg via INTRAVENOUS
  Filled 2019-09-15 (×2): qty 2

## 2019-09-15 MED ORDER — TRANEXAMIC ACID-NACL 1000-0.7 MG/100ML-% IV SOLN
1000.0000 mg | INTRAVENOUS | Status: AC
  Administered 2019-09-15: 1000 mg via INTRAVENOUS

## 2019-09-15 MED ORDER — SIMETHICONE 80 MG PO CHEW: 80.0000 mg | CHEWABLE_TABLET | ORAL | Status: DC | PRN

## 2019-09-15 MED ORDER — SODIUM CHLORIDE 0.9% IV SOLUTION: Freq: Once | INTRAVENOUS | Status: DC

## 2019-09-15 MED ORDER — MISOPROSTOL 25 MCG QUARTER TABLET
25.0000 ug | ORAL_TABLET | ORAL | Status: DC | PRN
  Administered 2019-09-15: 25 ug via VAGINAL

## 2019-09-15 MED ORDER — COCONUT OIL OIL
1.0000 "application " | TOPICAL_OIL | Status: DC | PRN
  Administered 2019-09-16: 1 via TOPICAL

## 2019-09-15 MED ORDER — MAGNESIUM SULFATE 40 GM/1000ML IV SOLN
1.0000 g/h | INTRAVENOUS | Status: AC
  Administered 2019-09-15: 1 g/h via INTRAVENOUS
  Filled 2019-09-15: qty 1000

## 2019-09-15 MED ORDER — TETANUS-DIPHTH-ACELL PERTUSSIS 5-2.5-18.5 LF-MCG/0.5 IM SUSP: 0.5000 mL | Freq: Once | INTRAMUSCULAR | Status: DC

## 2019-09-15 MED ORDER — ENALAPRIL MALEATE 5 MG PO TABS
10.0000 mg | ORAL_TABLET | Freq: Every day | ORAL | Status: DC
  Administered 2019-09-15 – 2019-09-18 (×4): 10 mg via ORAL
  Filled 2019-09-15 (×5): qty 2

## 2019-09-15 MED ORDER — TERBUTALINE SULFATE 1 MG/ML IJ SOLN: 0.2500 mg | Freq: Once | INTRAMUSCULAR | Status: DC | PRN

## 2019-09-15 MED ORDER — LABETALOL HCL 200 MG PO TABS
200.0000 mg | ORAL_TABLET | Freq: Three times a day (TID) | ORAL | Status: DC
  Administered 2019-09-15 (×2): 200 mg via ORAL
  Filled 2019-09-15 (×2): qty 1

## 2019-09-15 MED ORDER — BENZOCAINE-MENTHOL 20-0.5 % EX AERO
1.0000 "application " | INHALATION_SPRAY | CUTANEOUS | Status: DC | PRN
  Administered 2019-09-15: 1 via TOPICAL
  Filled 2019-09-15: qty 56

## 2019-09-15 MED ORDER — MEASLES, MUMPS & RUBELLA VAC IJ SOLR: 0.5000 mL | Freq: Once | INTRAMUSCULAR | Status: DC

## 2019-09-15 MED ORDER — MISOPROSTOL 200 MCG PO TABS
800.0000 ug | ORAL_TABLET | Freq: Once | ORAL | Status: AC
  Administered 2019-09-15: 21:00:00 800 ug via RECTAL

## 2019-09-15 NOTE — Progress Notes (Signed)
Labor Progress Note Maria Payne is a 29 y.o. G1P0000 at [redacted]w[redacted]d presented for IOL from [facility] specialty for [name]. S: Overall comfortable at the moment. Feeling some abdominal discomfort with ctx.  O:  BP (!) 145/85   Pulse 86   Temp 99.1 F (37.3 C) (Axillary)   Resp 16   Ht 5\' 8"  (1.727 m)   Wt (!) 136.9 kg Comment: nurse notified  LMP 02/23/2019 (Exact Date)   SpO2 94%   BMI 45.88 kg/m  EFM: 135, minimal to moderate variability, no accels, no decels TOCO: q47m  CVE: Dilation: 4 Effacement (%): 70 Cervical Position: Middle Station: -3 Presentation: Vertex Exam by:: Marguarite Arbour RN   A&P: 29 y.o. G1P0000 [redacted]w[redacted]d here for IOL for [name]. #Labor: Vertex by BSUS. S/p Cytotec x5 and FB. On exam, bag still present, AROM and IUPC placed. Pit started. Anticipate VD; CS as appropriate.  #Pain: per patient request #FWB: Cat II #GBS unknown; PCN  #[name]: Severe-range BP's and pulmonary edema. Labetalol 200 mg TID. Labetalol protocol ordered. Monitoring strict I's and O's. CMP/CBC q8h. Mag level q12h. Tele leads in place due to IV diuresis. Output adequate currently. Will order 20 mg IV Lasix for 2 more doses today 4 hours apart due to continued swelling although improved from yesterday. Cr stable. Electrolytes stable. 2 units on hold due to baseline anemia in [name] patient.  #IIH: Cont low dose ASA. 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  Patient also has history of intracranial stent for which patient takes aspirin. MOC: Desires interval BTL; possibly Depo during interval MOF: Breast  Gladys Damme, MD 11:20 AM

## 2019-09-15 NOTE — Consult Note (Signed)
Neonatology Note:   Attendance at Delivery:    I was asked by Dr. Marice Potter to attend this vaginal delivery via induction for worsening pre-eclampsia at 29.[redacted] weeks EGA. The mother is a G1, GBS neg with good prenatal care complicated by significant hx of IHH and recent conversionof VP shunt to [place] shunt, as well as anxiety, major depressive d/o, HTN, asthma and obesity. Covid/Flu neg. BTMZ complete 1/2/21and IV mag.  Also on PCN, Lasix, hydralazine, labetalol, and nifedipine. ROM 9h 13m prior to delivery, fluid clear. Infant initially not vigorous nor with good spontaneous cry and tone. Cord immediately cut and baby brought to wamrer and placed on heated mattress.  HR ~60bmp with no tone or resp effort.  PPV started with HR improvement.  Within 19min, baby with spontaneous resp effort, better tone, and color.  FIo2 weaned to 21-30% on cpap 6cm.  Baby vitals stable and little wob.  Ap 2/8.  Parents updated and father accompanied [place] to NICU.  Basic management plan discussed with him.   Monia Sabal Katherina Mires, MD Neonatologist 09/15/2019, 7:58 PM

## 2019-09-15 NOTE — Progress Notes (Signed)
Follow up visit with Mickel Baas and Thedore Mins.  Stormy shared that she is feeling much better today.  As she gets more fluid off and feels better physically, she is better able to relax emotionally as well.  She shared that she felt like the time had actually been helpful, though she is ready to meet her daughter.  I acknowledged that while people often assume a long labor is bad, sometimes having some time and space is helpful for the mind to catch up with the body.  Thedore Mins shared that he is feeling cooped up, but grateful that Lulamae is doing so much better today.    Will continue to follow.  Please page as further needs arise.  Donald Prose. Elyn Peers, M.Div. [facility] Chaplain Pager [phone] Office [phone]

## 2019-09-15 NOTE — Progress Notes (Signed)
Labor Progress Note Maria Payne is a 29 y.o. G1P0000 at [redacted]w[redacted]d presented for IOL from [facility] specialty for [name]. S: Overall comfortable at the moment. Feeling some abdominal discomfort with ctx.  O:  BP (!) 154/80   Pulse 85   Temp 99.1 F (37.3 C) (Axillary)   Resp 17   Ht 5\' 8"  (1.727 m)   Wt (!) 136.9 kg Comment: nurse notified  LMP 02/23/2019 (Exact Date)   SpO2 94%   BMI 45.88 kg/m  EFM: 135, minimal to moderate variability, no accels, no decels TOCO: q3m  CVE: Dilation: 4 Effacement (%): 70 Cervical Position: Middle Station: -3 Presentation: Vertex Exam by:: Marguarite Arbour RN   A&P: 29 y.o. G1P0000 [redacted]w[redacted]d here for IOL for [name]. #Labor: Vertex by BSUS. S/p Cytotec x5 and FB. SROM occurred, will plan to start Pit when Cytotec complete. Plan to place IUPC shortly thereafter. Anticipate VD; CS as appropriate.  #Pain: per patient request #FWB: Cat II #GBS unknown; PCN  #[name]: Severe-range BP's and pulmonary edema. Labetalol 200 mg TID. Labetalol protocol ordered. Monitoring strict I's and O's. CMP/CBC q8h. Mag level q12h. Tele leads in place due to IV diuresis. Output adequate currently. Will order 20 mg IV Lasix for 2 more doses today 4 hours apart due to continued swelling although improved from yesterday. Cr stable. Electrolytes stable. 2 units on hold due to baseline anemia in [name] patient.  #IIH: Cont low dose ASA. 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  Patient also has history of intracranial stent for which patient takes aspirin. MOC: Desires interval BTL; possibly Depo during interval MOF: Breast  Gladys Damme, MD 10:23 AM

## 2019-09-15 NOTE — Progress Notes (Addendum)
Maria Payne is a 29 y.o. G1P0000 at [redacted]w[redacted]d presented for IOL from [facility] specialty for [name].  Subjective: Worsening pain in her abdomen that feels like "poop cramps". Not measuring time between pain.   Objective: BP (!) 144/83   Pulse 75   Temp 98.4 F (36.9 C) (Oral)   Resp 18   Ht 5\' 8"  (1.727 m)   Wt (!) 136.9 kg Comment: nurse notified  LMP 02/23/2019 (Exact Date)   SpO2 96%   BMI 45.88 kg/m  I/O last 3 completed shifts: In: 5226.7 [P.O.:3710; I.V.:1166.7; IV Piggyback:350] Out: 2978 [Urine:2978] Total I/O In: 358.7 [P.O.:40; I.V.:218.7; IV Piggyback:100] Out: [id number]   Net IO Since Admission: 1,694.65 mL [09/15/19 0213]  General: appears tired, lying in bed, with head of bed at 45', moderate distress with . Pitting dependent edema up to waist.  Breathing comfortably on 0.5 L O2.  FHT:  FHR: 140 bpm, variability: min-mod,  accelerations:  Abscent,  decelerations:  Absent UC:  No regular pattern on toco. Patient not counting between contractions.  SVE:   Closed Effacement: Thick Station: -3 Exam By: Dr. Marice Potter, MD  Labs: Lab Results  Component Value Date   WBC 10.1 09/14/2019   HGB 9.4 (L) 09/14/2019   HCT 29.2 (L) 09/14/2019   MCV 84.1 09/14/2019   PLT 273 09/14/2019   Assessment / Plan: Induction of labor due to Severe PE,  progressing well on pitocin  Labor: Cytotec x3, last dose 2049. Anticipate next dose shortly. FB in place. Patient experiencing regular contractions w/ pain in her lower abdomen. Pain is worsened with tugging on FB.   Preeclampsia: Currently on 1 g of magnesium per hour given precarious fluid balance. Patient has been through labetalol protocol once today.She is currently on labetalol 200 mg twice daily which was ordered at [place] with a dose then and at 2200. 2200 dose held given 1853 dosing. Will dose second 200 mg at this time. BP ranges: BP: (123-178)/(67-99) 165/97 (01/05 0051). Patient denies hypotension, n/v, facial  flushing, urinary retention, lethargy, muscle weakness, difficulty breathing. She does report some blurred vision and mild headache.  Mg levels every 12 hours.   CMP/CBC q 8 hours.  Continue 200 mg BID labetalol  - possibly increased scheduled to TID?  IV push protocol Monitor s/s Mg tox   Pulmonary edema likely secondary to volume overload.  Patient is +4 liters net this morning. Patient responding well to diuresis. At this time, she is +2.0 L positive. Satting well at 0.5 L O2 without respiratory distress. Creatinine is stable.  Continue to monitor UOP.  Due for Lasix 20mg  @ 0200 dosing as scheduled, re-evaluate need for more lasix at that time CMP q 8 hours.   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  Patient also has history of intracranial stent for which patient takes aspirin. Cont low dose ASA.  Fetal Wellbeing:  Category II, reassuring for occasional moderate variability.  Pain Control:  Epidural and IV pain meds I/D:  GBS unknown, PCN ordered Anticipated MOD:  VD  MOC: Desires interval BTL; possibly Depo during interval  Zettie Cooley, MD 09/15/2019, 12:44 AM  GME ATTESTATION:  I saw and evaluated the patient. I agree with the findings and the plan of care as documented in the resident's note.  Merilyn Baba, DO OB Fellow, [place] for [place] 09/15/2019  2:17 AM

## 2019-09-15 NOTE — Progress Notes (Addendum)
Labor Progress Note Maria Payne is a 29 y.o. G1P0000 at [redacted]w[redacted]d presented for IOL from [facility] specialty for [name]. S: Overall comfortable at the moment. Feeling some abdominal discomfort with ctx.  O:  BP (!) 141/71   Pulse 78   Temp 98.8 F (37.1 C) (Oral)   Resp 16   Ht 5\' 8"  (1.727 m)   Wt (!) 136.9 kg Comment: nurse notified  LMP 02/23/2019 (Exact Date)   SpO2 97%   BMI 45.88 kg/m  EFM: 135, minimal to moderate variability, no accels, no decels TOCO: q55m  CVE: Dilation: 4 Effacement (%): 75 Cervical Position: Middle Station: -2 Presentation: Vertex Exam by:: Dr Chauncey Reading   A&P: 29 y.o. G1P0000 [redacted]w[redacted]d here for IOL for [name]. #Labor: Vertex by BSUS. S/p Cytotec x5 and FB. AROM at 1105, IUPC placed. [place] units adequate. Pit started. Anticipate VD; CS as appropriate.  #Pain: per patient request #FWB: Cat II #GBS unknown; PCN  #[name]: Severe-range BP's and pulmonary edema. Labetalol 200 mg TID. Labetalol protocol ordered. Monitoring strict I's and O's. CMP/CBC q8h. Mag level q12h. Tele leads in place due to IV diuresis. Output adequate currently. Will order 20 mg IV Lasix for 2 more doses today 4 hours apart due to continued swelling although improved from yesterday. Cr stable. Electrolytes stable. 2 units on hold due to baseline anemia in [name] patient.  #IIH: Cont low dose ASA. 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  Patient also has history of intracranial stent for which patient takes aspirin. MOC: Desires interval BTL; possibly Depo during interval MOF: Breast  Gladys Damme, MD 1:25 PM

## 2019-09-15 NOTE — Progress Notes (Addendum)
Labor Progress Note Maria Payne is a 29 y.o. G1P0000 at [redacted]w[redacted]d presented for IOL from [facility] specialty for [name]. S: Feeling stronger ctx and fairly uncomfortable.  O:  BP (!) 148/81   Pulse 89   Temp 99.1 F (37.3 C) (Axillary)   Resp 20   Ht 5\' 8"  (1.727 m)   Wt (!) 136.9 kg Comment: nurse notified  LMP 02/23/2019 (Exact Date)   SpO2 96%   BMI 45.88 kg/m  EFM: 135, minimal to moderate variability, no accels, intermittent subtle lates TOCO: q2-76m  CVE: Dilation: 5 Effacement (%): 80 Cervical Position: Middle Station: -2 Presentation: Vertex Exam by:: Wess Botts RNC   A&P: 29 y.o. G1P0000 [redacted]w[redacted]d here for IOL for [name]. #Labor: Vertex by BSUS. S/p Cytotec x5, FB and AROM. Pit at 16 and MVU's ~ 200 but will continue to titrate to ~ 240. Effacement improving and station as well. Anticipate SVD soon. Cont position changes. Cont amnioinfusion (variables earlier and now improved). #Pain: per patient request #FWB: Cat II #GBS unknown; PCN  #[name]: Severe-range BP's and pulmonary edema. Labetalol 200 mg TID. Labetalol protocol ordered. Monitoring strict I's and O's; now with net negative output and Cr stable. CMP/CBC q8h. Mag level q12h. Tele leads in place due to IV diuresis. Output adequate currently. Will order 20 mg IV Lasix for 4 more doses. Electrolytes stable. 2 units on hold due to baseline anemia in [name] patient. DIC panel WNL. #IIH: Cont low dose ASA. 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  Patient also has history of intracranial stent for which patient takes aspirin. MOC: Desires interval BTL; possibly Depo during interval MOF: Breast  [name], MD 5:09 PM

## 2019-09-15 NOTE — Progress Notes (Addendum)
Maria Payne is a 29 y.o. G1P0000 at [redacted]w[redacted]d presented for IOL from [facility] specialty for [name].  Subjective: Doing okay.  She reports that she only has trouble breathing when she is laying on her left side.  Otherwise, when she is sitting up, she is breathing okay.  Objective: BP (!) 154/82   Pulse 76   Temp 97.9 F (36.6 C) (Oral)   Resp 16   Ht 5\' 8"  (1.727 m)   Wt (!) 136.9 kg Comment: nurse notified  LMP 02/23/2019 (Exact Date)   SpO2 96%   BMI 45.88 kg/m  I/O last 3 completed shifts: In: 5226.7 [P.O.:3710; I.V.:1166.7; IV Piggyback:350] Out: 2978 [Urine:2978] Total I/O In: 569.6 [P.O.:165; I.V.:304.6; IV Piggyback:100] Out: 2710 [Urine:2710]   Net IO Since Admission: 950.52 mL [09/15/19 0640]  General: Appears tired.  Sitting up in bed. Breathing mildly more labored.  Currently on 1 L satting 96%.  She has more wheezing in her upper lung fields, R>L.  Still decreased lung sounds in right lower lobe.  Crackles in left lower lobe. FHT:  FHR: 140 bpm, variability: min-mod,  accelerations:  Abscent,  decelerations:  Absent UC:  No regular pattern on toco. Patient not counting between contractions.  Dilation: 4 Effacement (%): 50 Station: -3 Presentation: Vertex Exam by:: Dr. Maudie Mercury  Labs: Recent Labs  Lab 09/14/19 0849 09/14/19 1455 09/14/19 2302  WBC 8.7 8.4 10.1  HGB 8.2* 8.8* 9.4*  HCT 25.7* 27.2* 29.2*  PLT 244 254 273   Recent Labs  Lab 09/14/19 0849 09/14/19 1455 09/14/19 2302  NA 135 136 135  K 4.2 4.5 4.5  CL 104 106 105  CO2 20* 20* 20*  BUN 28* 26* 25*  CREATININE 0.85 0.91 0.89  CALCIUM 7.6* 7.7* 7.7*  PROT 4.6* 4.8* 5.0*  BILITOT 0.4 0.5 0.3  ALKPHOS 51 55 60  ALT 14 15 17   AST 17 19 23   GLUCOSE 91 85 93   Recent Labs    09/14/19 0106 09/14/19 1455 09/15/19 0246  MG 5.0* 4.0* 4.5*  ] Assessment / Plan: Induction of labor due to Severe PE.   Labor: Cytotec x5, last dose vaginal at 0645.  Status post Foley bulb.  Patient cervix  remains thick.   Preeclampsia: Currently on 1 g of magnesium per hour given precarious fluid balance.  Patient's blood pressures have ranged (123-178)/(67-99) 154/82 (01/05 0630).  Her pressures in 0000000 systolic improved with second dose of 200 mg labetalol.  Can consider increasing labetalol 200 mg to 3 times a day.  Patient also required IV push labetalol early in the morning. Patient denies hypotension, n/v, facial flushing, urinary retention, lethargy, muscle weakness, difficulty breathing. She does report some blurred vision and mild headache.  Labs appear stable at this time Mg levels every 12 hours.   CMP/CBC q 8 hours.  Continue 200 mg BID labetalol  - possibly increased scheduled to TID?  IV push protocol Monitor s/s Mg tox   Pulmonary edema likely secondary to volume overload.  Net +1 L.  Will dose 20 mg of Lasix at 0800.  Continue to monitor her UOP closely.  Creatinine is stable at this time. Patient with some increased wheezing in right upper lung fields.  She has also started to cough.  She does not have any fever at this time; however, did get dosed tylenol at 0400.  We will consult respiratory therapy and start albuterol with chamber.  Currently satting well on 1 L.  Low threshold to  obtain repeat chest x-ray if breathing worsens.  CMP q 8 hours.    Wean O2 as tolerated RT consulted. 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  Patient also has history of intracranial stent for which patient takes aspirin. Cont low dose ASA.  Fetal Wellbeing:  Category II, reassuring for occasional moderate variability.  Pain Control:  Epidural and IV pain meds I/D:  GBS unknown, PCN ordered Anticipated MOD: Vaginal delivery  MOC: Desires interval BTL; possibly Depo during interval  Zettie Cooley, MD 09/15/2019,  6:55 AM  GME ATTESTATION:  I saw and evaluated the patient. I agree with the findings and the plan of  care as documented in the resident's note.  Merilyn Baba, DO OB Fellow, [place] for [place] 09/15/2019 7:57 AM

## 2019-09-15 NOTE — Discharge Summary (Addendum)
Postpartum Discharge Summary  Date of Service updated done jvf     Patient Name: Maria Payne DOB: 1990/09/19 MRN: [id number]  Date of admission: 09/11/2019 Delivering Provider: [name]   Date of discharge: 09/19/2019  Admitting diagnosis: Severe preeclampsia, third trimester [O14.13] Intrauterine pregnancy: [redacted]w[redacted]d    Secondary diagnosis:  Active Problems:   IIH (idiopathic intracranial hypertension)   S/P ventriculoperitoneal shunt   Severe preeclampsia, third trimester   Pneumonia   Status post ventriculoatrial shunt placement   Chest pain   [redacted] weeks gestation of pregnancy  Additional problems: None     Discharge diagnosis: Preterm Pregnancy Delivered, Preeclampsia (severe) and Anemia                                                                                             Post partum procedures:  Augmentation: AROM, Pitocin, Cytotec and Foley Balloon  Complications: None  Hospital course:  Induction of Labor With Vaginal Delivery   29y.o. yo G1P0000 at 265w1das admitted to the hospital 09/11/2019 for induction of labor.  Indication for induction: Preeclampsia.  Patient had an uncomplicated labor course as follows: Initial SVE: closed/thick/high. Patient received Cytotec, Foley bulb, AROM and Pitocin. Received epidural. She then progressed to complete.  Membrane Rupture Time/Date: 9:45 AM ,09/15/2019   Intrapartum Procedures: Episiotomy: None [1]                                         Lacerations:  1st degree [2];Labial [10]  Patient had delivery of a Viable infant.  Information for the patient's newborn:  MaDominiqua, Cooner0[[id number]]Delivery Method: Vaginal, Spontaneous(Filed from Delivery Summary)    09/15/2019  Details of delivery can be found in separate delivery note. Patient desires interval BTL; appt requested in clinic. Magnesium continued for 24 hours. Patient started on Enalapril 10 mg ,increase to 20 mg/d, and Labetalol 400 bid,and BP's monitored  and improved adequately. Lasix IV continued and patient eventually able to wean off oxygen with easy oxygenation. Patient is discharged home 09/19/2019 Delivery time: 7:19 PM    Magnesium Sulfate received: Yes BMZ received: Yes Rhophylac:No MMR:No Transfusion:No  Physical exam  Vitals:   09/15/19 2000 09/15/19 2008 09/15/19 2015 09/15/19 2023  BP: (!) 171/86 (!) 171/95 (!) 167/90 (!) 157/77  Pulse: 95 96 87 88  Resp:    16  Temp:    99 F (37.2 C)  TempSrc:    Oral  SpO2: 93%  93% 94%  Weight:      Height:       General: alert, cooperative and no distress Lochia: appropriate Uterine Fundus: firm Incision: N/A DVT Evaluation: No evidence of DVT seen on physical exam. Labs: Lab Results  Component Value Date   WBC 13.9 (H) 09/15/2019   HGB 9.5 (L) 09/15/2019   HCT 29.6 (L) 09/15/2019   MCV 85.1 09/15/2019   PLT 283 09/15/2019   PLT 288 09/15/2019   CMP Latest Ref Rng & Units 09/15/2019  Glucose 70 -  99 mg/dL 130(H)  BUN 6 - 20 mg/dL 23(H)  Creatinine 0.44 - 1.00 mg/dL 0.91  Sodium 135 - 145 mmol/L 137  Potassium 3.5 - 5.1 mmol/L 4.6  Chloride 98 - 111 mmol/L 104  CO2 22 - 32 mmol/L 23  Calcium 8.9 - 10.3 mg/dL 7.3(L)  Total Protein 6.5 - 8.1 g/dL 5.0(L)  Total Bilirubin 0.3 - 1.2 mg/dL 0.4  Alkaline Phos 38 - 126 U/L 61  AST 15 - 41 U/L 22  ALT 0 - 44 U/L 17    Discharge instruction: per After Visit Summary and "Baby and Me Booklet".  After visit meds:    Diet: routine diet  Activity: Advance as tolerated. Pelvic rest for 6 weeks.   Outpatient follow up:4 weeks Follow up Appt:No future appointments. Follow up Visit:   Please schedule this patient for Postpartum visit in: 4 weeks with the following provider: MD High risk pregnancy complicated by: HTN Delivery mode:  SVD Anticipated Birth Control:  Plans Interval BTL PP Procedures needed: BP check and schedule for BTL (needs paperwork signed and pre-op visit)  Schedule Integrated [place] visit:  no      Newborn Data: Live born female  Birth Weight: 2 lb 7.2 oz (1110 g) APGAR: 2, 8  Newborn Delivery   Birth date/time: 09/15/2019 19:19:00 Delivery type: Vaginal, Spontaneous      Baby Feeding: breast . pumping Disposition:NICU   09/15/2019 Updated 09/19/2019 Jonnie Kind, MD

## 2019-09-15 NOTE — Progress Notes (Signed)
Labor Progress Note Maria Payne is a 29 y.o. G1P0000 at [redacted]w[redacted]d presented for IOL from [facility] specialty for [name]. S: Overall comfortable at the moment. Feeling some abdominal discomfort with ctx.  O:  BP (!) 154/83   Pulse 72   Temp 98.7 F (37.1 C) (Oral)   Resp 17   Ht 5\' 8"  (1.727 m)   Wt (!) 136.9 kg Comment: nurse notified  LMP 02/23/2019 (Exact Date)   SpO2 97%   BMI 45.88 kg/m  EFM: 135, minimal to moderate variability, no accels, no decels TOCO: q16m  CVE: Dilation: 4 Effacement (%): 50 Station: -3 Presentation: Vertex Exam by:: Dr. Maudie Mercury   A&P: 29 y.o. G1P0000 [redacted]w[redacted]d here for IOL for [name]. #Labor: Vertex by BSUS. S/p Cytotec x5 and FB. Will recheck after Cytotec complete and consider starting Pit and performing AROM. Anticipate VD; CS as appropriate.  #Pain: per patient request #FWB: Cat II #GBS unknown; PCN  #[name]: Severe-range BP's and pulmonary edema. Labetalol 200 mg TID. Labetalol protocol ordered. Monitoring strict I's and O's. CMP/CBC q8h. Mag level q12h. Tele leads in place due to IV diuresis. Output adequate currently. Will order 20 mg IV Lasix for 2 more doses today 4 hours apart due to continued swelling although improved from yesterday. Cr stable. Electrolytes stable. 2 units on hold due to baseline anemia in [name] patient.  #IIH: Cont low dose ASA. 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  Patient also has history of intracranial stent for which patient takes aspirin. MOC: Desires interval BTL; possibly Depo during interval MOF: Breast  [name], MD 9:27 AM

## 2019-09-16 ENCOUNTER — Encounter (HOSPITAL_COMMUNITY): Payer: Self-pay | Admitting: Obstetrics and Gynecology

## 2019-09-16 DIAGNOSIS — D62 Acute posthemorrhagic anemia: Secondary | ICD-10-CM

## 2019-09-16 LAB — COMPREHENSIVE METABOLIC PANEL
ALT: 15 U/L (ref 0–44)
AST: 16 U/L (ref 15–41)
Albumin: 1.7 g/dL — ABNORMAL LOW (ref 3.5–5.0)
Alkaline Phosphatase: 53 U/L (ref 38–126)
Anion gap: 5 (ref 5–15)
BUN: 20 mg/dL (ref 6–20)
CO2: 24 mmol/L (ref 22–32)
Calcium: 7 mg/dL — ABNORMAL LOW (ref 8.9–10.3)
Chloride: 107 mmol/L (ref 98–111)
Creatinine, Ser: 0.68 mg/dL (ref 0.44–1.00)
GFR calc Af Amer: >60 mL/min (ref >60)
GFR calc non Af Amer: >60 mL/min (ref >60)
Glucose, Bld: 84 mg/dL (ref 70–99)
Potassium: 4.3 mmol/L (ref 3.5–5.1)
Sodium: 136 mmol/L (ref 135–145)
Total Bilirubin: 0.3 mg/dL (ref 0.3–1.2)
Total Protein: 4.2 g/dL — ABNORMAL LOW (ref 6.5–8.1)

## 2019-09-16 LAB — CBC
HCT: 25.2 % — ABNORMAL LOW (ref 36.0–46.0)
Hemoglobin: 8.1 g/dL — ABNORMAL LOW (ref 12.0–15.0)
MCH: 27 pg (ref 26.0–34.0)
MCHC: 32.1 g/dL (ref 30.0–36.0)
MCV: 84 fL (ref 80.0–100.0)
Platelets: 236 10*3/uL (ref 150–400)
RBC: 3 MIL/uL — ABNORMAL LOW (ref 3.87–5.11)
RDW: 13.8 % (ref 11.5–15.5)
WBC: 13 10*3/uL — ABNORMAL HIGH (ref 4.0–10.5)
nRBC: 0 % (ref 0.0–0.2)

## 2019-09-16 MED ORDER — SODIUM CHLORIDE 0.9 % IV SOLN
500.0000 mg | Freq: Once | INTRAVENOUS | Status: AC
  Administered 2019-09-16: 11:00:00 500 mg via INTRAVENOUS
  Filled 2019-09-16: qty 25

## 2019-09-16 MED ORDER — LACTATED RINGERS IV SOLN: INTRAVENOUS | Status: DC

## 2019-09-16 MED ORDER — ASPIRIN 81 MG PO CHEW
81.0000 mg | CHEWABLE_TABLET | Freq: Every day | ORAL | Status: DC
  Administered 2019-09-16 – 2019-09-19 (×4): 81 mg via ORAL
  Filled 2019-09-16 (×4): qty 1

## 2019-09-16 MED ORDER — ESCITALOPRAM OXALATE 10 MG PO TABS
20.0000 mg | ORAL_TABLET | Freq: Every day | ORAL | Status: DC
  Administered 2019-09-16 – 2019-09-18 (×3): 20 mg via ORAL
  Filled 2019-09-16 (×3): qty 2

## 2019-09-16 NOTE — Progress Notes (Addendum)
Post Partum Day 1 Subjective: no complaints, up ad lib and tolerating PO Feels dizzy. IV infiltrated and her Magnesium is off right now.  Objective: Blood pressure (!) 148/92, pulse 78, temperature 97.8 F (36.6 C), temperature source Oral, resp. rate 18, height 5\' 8"  (1.727 m), weight (!) 136.9 kg, last menstrual period 02/23/2019, SpO2 98 %, unknown if currently breastfeeding.  Intake/Output Summary (Last 24 hours) at 09/16/2019 1106 Last data filed at 09/16/2019 1048 Gross per 24 hour  Intake 2904.1 ml  Output 3179 ml  Net -274.9 ml   Physical Exam:  General: alert, cooperative and appears stated age 29: appropriate Uterine Fundus: firm DVT Evaluation: No evidence of DVT seen on physical exam.  Recent Labs    09/15/19 2124 09/16/19 0651  HGB 8.8* 8.1*  HCT 26.5* 25.2*    Assessment/Plan: Breastfeeding, Lactation consult, Social Work consult and Contraception sterilization--offered her vasectomy On lisinopril, BP is well controlled since delivery Awaiting more brisk diuresis, continue Lasix Magnesium x 24 hours post delivery Begin pumping IV iron given due to low Hgb--acute blood loss on chronic anemia Resume home meds, Lexapro and ASA   LOS: 5 days   Donnamae Jude 09/16/2019, 11:03 AM

## 2019-09-16 NOTE — Anesthesia Postprocedure Evaluation (Signed)
Anesthesia Post Note  Patient: Maria Payne  Procedure(s) Performed: AN AD [place]     Patient location during evaluation: Mother Baby Anesthesia Type: Epidural Level of consciousness: awake Pain management: satisfactory to patient Vital Signs Assessment: post-procedure vital signs reviewed and stable Respiratory status: spontaneous breathing Cardiovascular status: stable Anesthetic complications: no    Last Vitals:  Vitals:   09/16/19 1141 09/16/19 1200  BP: (!) 149/87   Pulse: 98   Resp: 18 18  Temp: 36.8 C   SpO2: 97%     Last Pain:  Vitals:   09/16/19 1141  TempSrc: Oral  PainSc:    Pain Goal: Patients Stated Pain Goal: 3 (09/16/19 0800)                 Casimer Lanius

## 2019-09-16 NOTE — Progress Notes (Signed)
Follow up with Mickel Baas after the delivery of her daughter, Cheri Guppy.  Cassandrea shared that she sent Zach home to rest.  She shared how much they've been through in the last year.  They were married in May and moved in with Zach's family because his mother was severely injured in a car accident and is unable to care for herself.  Lorri lost her job as a result of Covid and wasn't expecting to be able to conceive so soon because of some other health complications-which ended up requiring a brain surgery in November.  She shared that she is still processing the fullness of her testimony.  We discussed the power of acknowledging how difficult things are and how many view that as a sign of lack of faith, when it can often serve to refine one's faith.  She is eager for the moment when she can hold harper and knows that she's getting the best care, but grieving not being able to be the one to provide that care.  She is also missing her step sons, Dominic-5 and August Luz, but knows that going home to see them means that she'll be away from [place].  She is coping well with all she is managing and states that she has good support and a good relationship with the boys' mother who is also one of her best friends.  Please page as further needs arise.  Donald Prose. Elyn Peers, M.Div. [facility] Chaplain Pager [phone] Office [phone]

## 2019-09-16 NOTE — Lactation Note (Signed)
This note was copied from a baby's chart. Lactation Consultation Note  Patient Name: Maria Payne [id number] Date: 09/16/2019 Reason for consult: Initial assessment;NICU baby;1st time breastfeeding;Primapara;Infant < 6lbs;Preterm <34wks  Visited with mom of a 30 hours old pre-term NICU female < 3 lbs. She's a P1 and reported (+) breast changes during the pregnancy. She participated in the [facility] program at [facility]. Her sister is trying to get her a DEBP through her insurance, but LC will also fax a [place] referral.   RN Delsa Sale has been proactive and already set mom up with a DEBP, she started using it this morning but got discouraged because "nothing came out". Explained to mom that the main purpose of pumping early on is mainly for breast stimulation and not to get volume, she voiced understanding.  LC showed mom how to hand express, and she was able to get a small drop of colostrum out of her right breast, she got very excited, praised her for her efforts. Notices that mom's nipples are flat and her tissue is not very compressible, she has dense breast tissue. Reviewed benefits of premature milk, pumping schedule and breastmilk storage guidelines for NICU babies.  Feeding plan:  1. Encouraged mom to start pumping pumping every 3 hours; at least 8 pumping sessions in 24/hours; but she may start at her own pace, she was getting an iron transfusion during [facility] consultation 2. Hand expression prior pumping sessions was also encouraged 3. Mom will apply coconut oil prior pumping until she gets enough colostrum to treat sore nipples. Once colostrum is available, she'll start turning it in to baby's NICU RN.  BF brochure, BF resources and NICU booklet were reviewed. Dad present and supportive; he told LC that he's familiar with BF because he has a 30 y.o and a 30 y.o at home; they're mom's stepchildren. Parents reported all questions and concerns were answered, they're both aware of [facility] OP services and will  call PRN.   Maternal Data Formula Feeding for Exclusion: No Has patient been taught Hand Expression?: Yes Does the patient have breastfeeding experience prior to this delivery?: No  Feeding    LATCH Score                   Interventions Interventions: Breast feeding basics reviewed;Breast massage;Hand express;Breast compression;DEBP;Coconut oil  Lactation Tools Discussed/Used Tools: Pump;Coconut oil Breast pump type: Double-Electric Breast Pump WIC Program: Yes Pump Review: Setup, frequency, and cleaning;Milk Storage Initiated by:: RN and MPeck (adjusted junctures on tubing and reviewed breastmilk storage guidelines) Date initiated:: 09/16/19   Consult Status Consult Status: PRN Follow-up type: In-patient    Mikalah Skyles Francene Boyers 09/16/2019, 1:43 PM

## 2019-09-17 LAB — BPAM RBC
Blood Product Expiration Date: 202101282359
Blood Product Expiration Date: 202101282359
Unit Type and Rh: 6200
Unit Type and Rh: 6200

## 2019-09-17 LAB — CBC
HCT: 25.6 % — ABNORMAL LOW (ref 36.0–46.0)
Hemoglobin: 8.3 g/dL — ABNORMAL LOW (ref 12.0–15.0)
MCH: 27.5 pg (ref 26.0–34.0)
MCHC: 32.4 g/dL (ref 30.0–36.0)
MCV: 84.8 fL (ref 80.0–100.0)
Platelets: 243 10*3/uL (ref 150–400)
RBC: 3.02 MIL/uL — ABNORMAL LOW (ref 3.87–5.11)
RDW: 14 % (ref 11.5–15.5)
WBC: 9.6 10*3/uL (ref 4.0–10.5)
nRBC: 0 % (ref 0.0–0.2)

## 2019-09-17 LAB — TYPE AND SCREEN
ABO/RH(D): A POS
Antibody Screen: NEGATIVE
Unit division: 0
Unit division: 0

## 2019-09-17 MED ORDER — DIPHENHYDRAMINE HCL 50 MG/ML IJ SOLN
25.0000 mg | Freq: Once | INTRAMUSCULAR | Status: AC
  Administered 2019-09-17: 17:00:00 25 mg via INTRAVENOUS
  Filled 2019-09-17: qty 1

## 2019-09-17 MED ORDER — LABETALOL HCL 200 MG PO TABS
200.0000 mg | ORAL_TABLET | Freq: Once | ORAL | Status: AC
  Administered 2019-09-17: 08:00:00 200 mg via ORAL
  Filled 2019-09-17: qty 1

## 2019-09-17 MED ORDER — FUROSEMIDE 40 MG PO TABS
40.0000 mg | ORAL_TABLET | Freq: Two times a day (BID) | ORAL | Status: DC
  Administered 2019-09-17 – 2019-09-19 (×5): 40 mg via ORAL
  Filled 2019-09-17 (×5): qty 1

## 2019-09-17 MED ORDER — DEXAMETHASONE SODIUM PHOSPHATE 10 MG/ML IJ SOLN
10.0000 mg | Freq: Once | INTRAMUSCULAR | Status: AC
  Administered 2019-09-17: 17:00:00 10 mg via INTRAVENOUS
  Filled 2019-09-17: qty 1

## 2019-09-17 MED ORDER — METOCLOPRAMIDE HCL 5 MG/ML IJ SOLN
10.0000 mg | Freq: Once | INTRAMUSCULAR | Status: AC
  Administered 2019-09-17: 17:00:00 10 mg via INTRAVENOUS
  Filled 2019-09-17: qty 2

## 2019-09-17 MED ORDER — LABETALOL HCL 200 MG PO TABS
400.0000 mg | ORAL_TABLET | Freq: Two times a day (BID) | ORAL | Status: DC
  Administered 2019-09-17 – 2019-09-19 (×4): 400 mg via ORAL
  Filled 2019-09-17 (×4): qty 2

## 2019-09-17 MED ORDER — HYDROMORPHONE HCL 1 MG/ML IJ SOLN
1.0000 mg | Freq: Once | INTRAMUSCULAR | Status: AC
  Administered 2019-09-17: 06:00:00 1 mg via INTRAVENOUS
  Filled 2019-09-17: qty 1

## 2019-09-17 MED ORDER — LABETALOL HCL 200 MG PO TABS
200.0000 mg | ORAL_TABLET | Freq: Two times a day (BID) | ORAL | Status: DC
  Administered 2019-09-17: 06:00:00 200 mg via ORAL
  Filled 2019-09-17: qty 1

## 2019-09-17 NOTE — Progress Notes (Signed)
Patient BP readings this AM elevated. Dr. Hulan Fray notified. New orders for labetalol 200 BID, one to be given now. Dilaudid 1mg  ordered once for headache.

## 2019-09-17 NOTE — Progress Notes (Signed)
Post Partum Day 3 Subjective: no complaints, up ad lib, voiding and tolerating PO  Objective: Blood pressure (!) 164/89, pulse 78, temperature 98.1 F (36.7 C), temperature source Oral, resp. rate 19, height 5\' 8"  (1.727 m), weight (!) 136.9 kg, last menstrual period 02/23/2019, SpO2 100 %, unknown if currently breastfeeding.  Physical Exam:  General: alert Lochia: appropriate Uterine Fundus: firm DVT Evaluation: No evidence of DVT seen on physical exam.  Recent Labs    09/15/19 2124 09/16/19 0651  HGB 8.8* 8.1*  HCT 26.5* 25.2*    Assessment/Plan: Add lasix BID and increase labetalol to 400 mg BID   LOS: 6 days   Emily Filbert 09/17/2019, 7:43 AM

## 2019-09-18 LAB — SURGICAL PATHOLOGY

## 2019-09-18 MED ORDER — ENALAPRIL MALEATE 20 MG PO TABS: 20.0000 mg | ORAL_TABLET | Freq: Every day | ORAL | 1 refills | Status: DC

## 2019-09-18 MED ORDER — FUROSEMIDE 40 MG PO TABS: 40.0000 mg | ORAL_TABLET | Freq: Two times a day (BID) | ORAL | 0 refills | Status: DC

## 2019-09-18 MED ORDER — LABETALOL HCL 200 MG PO TABS: 400.0000 mg | ORAL_TABLET | Freq: Two times a day (BID) | ORAL | 1 refills | Status: DC

## 2019-09-18 MED ORDER — ENALAPRIL MALEATE 5 MG PO TABS
20.0000 mg | ORAL_TABLET | Freq: Every day | ORAL | Status: DC
  Administered 2019-09-19: 09:00:00 20 mg via ORAL
  Filled 2019-09-18: qty 4

## 2019-09-18 MED FILL — ENALAPRIL MALEATE 20 MG TAB: 20 | 30 days supply | Qty: 30 | Fill #0

## 2019-09-18 MED FILL — FUROSEMIDE 40 MG TABLET: 40 | 15 days supply | Qty: 30 | Fill #0

## 2019-09-18 MED FILL — LABETALOL HCL 200 MG TABLET: 200 | 30 days supply | Qty: 120 | Fill #0

## 2019-09-18 NOTE — Progress Notes (Signed)
I offered follow up support to Maria Payne and Maria Payne while they were up in the NICU visiting Maria Payne.  They reported that they are doing much better today and that they are trying to stay in the present moment.  Maria Payne is looking forward to being home again soon with their other children and their animals.    We will continue to follow up with them as we are able, but please also page as needs arise.  [place], [place] Pager, [phone] 3:16 PM

## 2019-09-18 NOTE — Clinical Social Work Maternal (Addendum)
CLINICAL SOCIAL WORK MATERNAL/CHILD NOTE  Patient Details  Name: Maria Payne MRN: [id number] Date of Birth: Jan 31, 1991  Date:  09/18/2019  Clinical Social Worker Initiating Note:  Laurey Arrow Date/Time: Initiated:  09/18/19/1105     Child's Name:  Maria Payne   Biological Parents:  Father, Mother   Need for Interpreter:  None   Reason for Referral:  Behavioral Health Concerns, Parental Support of Premature Babies < 32 weeks/or Critically Ill babies(hx of anxiety and depression.)   Address:  [address] [place] [address]    Phone number:  [phone] (home)     Additional phone number: FOB's number is [phone]  Household Members/Support Persons (HM/SP):   Household Member/Support Person 1, Household Member/Support Person 3, Household Member/Support Person 2   HM/SP Name Relationship DOB or Age  HM/SP -1 Marc Sivertsen FOB/Husband 03/09/1993  HM/SP -2 Domonic Rosaria Ferries stepson 12/04/2013  HM/SP -3 Ina Homes stepson 06/22/2016  HM/SP -4        HM/SP -5        HM/SP -6        HM/SP -7        HM/SP -8          Natural Supports (not living in the home):  Parent, Immediate Family, Extended Family   Professional Supports: Therapist   Employment: Unemployed   Type of Work:     Education:  [place] arranged:    Museum/gallery curator Resources:  Medicaid   Other Resources:  Location manager provided family information to apply for [employer].)   Cultural/Religious Considerations Which May Impact Care:  None reported  Strengths:  Ability to meet basic needs , Psychotropic Medications, Compliance with medical plan , Pediatrician chosen, Home prepared for child , Understanding of illness   Psychotropic Medications:  Lexapro      Pediatrician:    [place]  Pediatrician List:   [facility] for [place]      Pediatrician Fax  Number:    Risk Factors/Current Problems:  Mental Health Concerns    Cognitive State:  Able to Concentrate , Alert , Insightful , Linear Thinking , Goal Oriented    Mood/Affect:  Interested , Comfortable , Relaxed , Happy , Calm    CSW Assessment: CSW met with MOB in room 104 to complete an assessment for NICU admission and MH hx.  When CSW arrived to MOB's room MOB was resting in bed and FOB was relaxing on the couch.  CSW explained CSW's role and MOB gave CSW permission to complete clinical assessment while FOB was present. The couple appeared supportive of one another and was receptive to meeting with CSW. MOB was polite, forthcoming, and was easy to engage.   CSW asked about MOB's thoughts and feeling regarding infant's NICU admission. MOB shared feeling overwhelmed and concerned about infant's health.  CSW validated MOB's thoughts and feelings and shared other emotions that MOB may experience during the postpartum period and infant's NICU stay. MOB was understanding and reported taking Lexparo to assist with her symptoms. MOB stated, "Prior to me having my baby my medication was working perfectly."  Per MOB, MOB's medication is managed by her family physician and she receives outpatient counseling with [facility]. MOB shared she was dx with anxiety and depression as a middle school student and counseling and medication have been her  course of treatment.  CSW educated MOB about PPD. CSW informed MOB of possible supports and interventions to decrease PPD.  CSW also encouraged MOB to seek medical attention if needed for increased signs and symptoms of PPD.   CSW recommends self-evaluation during the postpartum time period using the New Mom Checklist from Postpartum Progress and encouraged MOB to contact a medical professional if symptoms are noted at any time. MOB presented with insight and awareness and denied SI and HI when assessed for safety. MOB also shared having a good support team and  feeling comfortable seeking help if help is warranted.   MOB feels well informed about infant's health and expressed gratitude towards NICU staff.   MOB plans to obtain all essential items for infant prior to infant's discharge.  MOB agreed to contact CSW if help is needed.   CSW will continue to offer family resources and supports while infant remains in NICU.   CSW Plan/Description:  Psychosocial Support and Ongoing Assessment of Needs, Perinatal Mood and Anxiety Disorder (PMADs) Education, Other Patient/Family Education, Other Information/Referral to [employer], MSW, [employer] Social Work [phone]    Dimple Nanas, LCSW 09/18/2019, 11:34 AM

## 2019-09-18 NOTE — Lactation Note (Signed)
This note was copied from a baby's chart. Lactation Consultation Note  Patient Name: Maria Payne [id number] Date: 09/18/2019  [name] is 29 hours old in the NICU.  Mom is pumping every 3 hours and obtaining small amounts of colostrum.  She asked for pads because she started to leak on one side.  Pads given.  Encouraged to call for assist prn.   Maternal Data    Feeding    LATCH Score                   Interventions    Lactation Tools Discussed/Used     Consult Status      [name], [name] 09/18/2019, 1:29 PM

## 2019-09-18 NOTE — Progress Notes (Signed)
Post Partum Day 3 Subjective: no complaints, up ad lib, voiding and tolerating PO  Objective: Blood pressure (!) 156/86, pulse 85, temperature 98 F (36.7 C), temperature source Oral, resp. rate 18, height 5\' 8"  (1.727 m), weight (!) 136.9 kg, last menstrual period 02/23/2019, SpO2 99 %, unknown if currently breastfeeding.  Intake/Output Summary (Last 24 hours) at 09/18/2019 1056 Last data filed at 09/18/2019 1011 Gross per 24 hour  Intake --  Output 2350 ml  Net -2350 ml    Physical Exam:  General: alert, cooperative and appears stated age Lochia: appropriate Uterine Fundus: firm DVT Evaluation: No evidence of DVT seen on physical exam.  Recent Labs    09/16/19 0651 09/17/19 0816  HGB 8.1* 8.3*  HCT 25.2* 25.6*    Assessment/Plan: Plan for discharge tomorrow and Contraception vasectomy Will increase her Enalopril for improved BP control Continue Labetalol Continue Lasix to aid in diuresis.   LOS: 7 days   Maria Payne 09/18/2019, 10:55 AM

## 2019-09-19 MED ORDER — ASPIRIN EC 81 MG PO TBEC: 81.0000 mg | DELAYED_RELEASE_TABLET | Freq: Every day | ORAL | 3 refills | Status: DC

## 2019-09-19 NOTE — Lactation Note (Addendum)
This note was copied from a Maria's chart. Lactation Consultation Note  Patient Name: Maria Payne [id number] Date: 09/19/2019 Reason for consult: Follow-up assessment;Primapara;1st time breastfeeding;NICU Maria;Infant < 6lbs;Preterm <34wks  Maria Maria "Payne"  LC in to visit with P1 Mom of 29wk Maria in the NICU.  Maria 36 hrs old.  Mom has been very diligent with regular pumping and is now expressing more milk.  Mom to use regular setting on DEBP, and demonstrated this for her.  [facility] loaner offered and recommended.  Mom plans to borrow her best friend's Medela PIS until Monday when she can obtain a DEBP from [facility].  Mom knows to take her pump parts home and to hospital when with Maria in the NICU, in order to utilize the Symphony pump at Maria's bedside.    Disassembled pump parts, washed, rinsed and placed in separate bin for drying, obtained tooth brush bottle brush for Mom.   Encouraged breast massage and hand expression.  Mom has a hand's free pumping bra.    Engorgement prevention and treatment reviewed.  Mom instructed to awaken to pump every 4 hrs at night, 2-3 hrs during daytime.   Mom to call prn for concerns, is aware of OP lactation support available to her.   Interventions Interventions: Breast feeding basics reviewed;Skin to skin;Breast massage;Hand express;DEBP;Coconut oil  Lactation Tools Discussed/Used Tools: Pump;Coconut oil Breast pump type: Double-Electric Breast Pump   Consult Status Consult Status: Complete Date: 09/19/19 Follow-up type: Call as needed    Maria Payne 09/19/2019, 8:31 AM

## 2019-09-19 NOTE — Progress Notes (Signed)
Post Partum Day 4 Subjective: no complaints, up ad lib, tolerating PO and feels well  Objective: Blood pressure (!) 156/85, pulse 87, temperature 98.7 F (37.1 C), temperature source Oral, resp. rate 18, height 5\' 8"  (1.727 m), weight (!) 136.9 kg, last menstrual period 02/23/2019, SpO2 100 %, unknown if currently breastfeeding.  Physical Exam:  General: alert, cooperative, appears stated age and no distress Lochia: appropriate Uterine Fundus: firm Incision:  DVT Evaluation: No evidence of DVT seen on physical exam.  Recent Labs    09/16/19 0651 09/17/19 0816  HGB 8.1* 8.3*  HCT 25.2* 25.6*    Assessment/Plan: Discharge home and Contraception interval tubal.   LOS: 8 days   Jonnie Kind 09/19/2019, 6:42 AM

## 2019-09-19 NOTE — Discharge Instructions (Signed)
Preeclampsia and Eclampsia Preeclampsia is a serious condition that may develop during pregnancy. This condition causes high blood pressure and increased protein in your urine along with other symptoms, such as headaches and vision changes. These symptoms may develop as the condition gets worse. Preeclampsia may occur at 20 weeks of pregnancy or later. Diagnosing and treating preeclampsia early is very important. If not treated early, it can cause serious problems for you and your baby. One problem it can lead to is eclampsia. Eclampsia is a condition that causes muscle jerking or shaking (convulsions or seizures) and other serious problems for the mother. During pregnancy, delivering your baby may be the best treatment for preeclampsia or eclampsia. For most women, preeclampsia and eclampsia symptoms go away after giving birth. In rare cases, a woman may develop preeclampsia after giving birth (postpartum preeclampsia). This usually occurs within 48 hours after childbirth but may occur up to 6 weeks after giving birth. What are the causes? The cause of preeclampsia is not known. What increases the risk? The following risk factors make you more likely to develop preeclampsia:  Being pregnant for the first time.  Having had preeclampsia during a past pregnancy.  Having a family history of preeclampsia.  Having high blood pressure.  Being pregnant with more than one baby.  Being 35 or older.  Being African-American.  Having kidney disease or diabetes.  Having medical conditions such as lupus or blood diseases.  Being very overweight (obese). What are the signs or symptoms? The most common symptoms are:  Severe headaches.  Vision problems, such as blurred or double vision.  Abdominal pain, especially upper abdominal pain. Other symptoms that may develop as the condition gets worse include:  Sudden weight gain.  Sudden swelling of the hands, face, legs, and feet.  Severe nausea  and vomiting.  Numbness in the face, arms, legs, and feet.  Dizziness.  Urinating less than usual.  Slurred speech.  Convulsions or seizures. How is this diagnosed? There are no screening tests for preeclampsia. Your health care provider will ask you about symptoms and check for signs of preeclampsia during your prenatal visits. You may also have tests that include:  Checking your blood pressure.  Urine tests to check for protein. Your health care provider will check for this at every prenatal visit.  Blood tests.  Monitoring your baby's heart rate.  Ultrasound. How is this treated? You and your health care provider will determine the treatment approach that is best for you. Treatment may include:  Having more frequent prenatal exams to check for signs of preeclampsia, if you have an increased risk for preeclampsia.  Medicine to lower your blood pressure.  Staying in the hospital, if your condition is severe. There, treatment will focus on controlling your blood pressure and the amount of fluids in your body (fluid retention).  Taking medicine (magnesium sulfate) to prevent seizures. This may be given as an injection or through an IV.  Taking a low-dose aspirin during your pregnancy.  Delivering your baby early. You may have your labor started with medicine (induced), or you may have a cesarean delivery. Follow these instructions at home: Eating and drinking   Drink enough fluid to keep your urine pale yellow.  Avoid caffeine. Lifestyle  Do not use any products that contain nicotine or tobacco, such as cigarettes and e-cigarettes. If you need help quitting, ask your health care provider.  Do not use alcohol or drugs.  Avoid stress as much as possible. Rest and get   plenty of sleep. General instructions  Take over-the-counter and prescription medicines only as told by your health care provider.  When lying down, lie on your left side. This keeps pressure off your  major blood vessels.  When sitting or lying down, raise (elevate) your feet. Try putting some pillows underneath your lower legs.  Exercise regularly. Ask your health care provider what kinds of exercise are best for you.  Keep all follow-up and prenatal visits as told by your health care provider. This is important. How is this prevented? There is no known way of preventing preeclampsia or eclampsia from developing. However, to lower your risk of complications and detect problems early:  Get regular prenatal care. Your health care provider may be able to diagnose and treat the condition early.  Maintain a healthy weight. Ask your health care provider for help managing weight gain during pregnancy.  Work with your health care provider to manage any long-term (chronic) health conditions you have, such as diabetes or kidney problems.  You may have tests of your blood pressure and kidney function after giving birth.  Your health care provider may have you take low-dose aspirin during your next pregnancy. Contact a health care provider if:  You have symptoms that your health care provider told you may require more treatment or monitoring, such as: ? Headaches. ? Nausea or vomiting. ? Abdominal pain. ? Dizziness. ? Light-headedness. Get help right away if:  You have severe: ? Abdominal pain. ? Headaches that do not get better. ? Dizziness. ? Vision problems. ? Confusion. ? Nausea or vomiting.  You have any of the following: ? A seizure. ? Sudden, rapid weight gain. ? Sudden swelling in your hands, ankles, or face. ? Trouble moving any part of your body. ? Numbness in any part of your body. ? Trouble speaking. ? Abnormal bleeding.  You faint. Summary  Preeclampsia is a serious condition that may develop during pregnancy.  This condition causes high blood pressure and increased protein in your urine along with other symptoms, such as headaches and vision  changes.  Diagnosing and treating preeclampsia early is very important. If not treated early, it can cause serious problems for you and your baby.  Get help right away if you have symptoms that your health care provider told you to watch for. This information is not intended to replace advice given to you by your health care provider. Make sure you discuss any questions you have with your health care provider. Document Revised: 04/29/2018 Document Reviewed: 04/02/2016 Elsevier Patient Education  2020 Elsevier Inc.  

## 2019-09-21 ENCOUNTER — Ambulatory Visit: Payer: Self-pay

## 2019-09-21 NOTE — Lactation Note (Signed)
This note was copied from a baby's chart. Lactation Consultation Note  Patient Name: Maria Payne [id number] Date: 09/21/2019 Reason for consult: Follow-up assessment;Preterm <34wks;NICU baby;Infant < 6lbs  In for follow-up visit with mom and baby. Baby Maria "Maria Payne" was born at 66wks. Baby is 40 days old. Mom reports that pumping is going well. Shares that she struggles with pumping overnight, but otherwise is able to pump every 2-3 hours. She states that she is getting about 1-1.5 oz combined per pump session. Encouraged hand expression and breast massaging with pumping. Affirmed mother that she is doing a good job. Mom states she has no further questions or concerns at this time. Assured mom that [place] is available as needed.    Maternal Data    Feeding    LATCH Score                   Interventions Interventions: Breast feeding basics reviewed;Breast massage;Hand express;DEBP  Lactation Tools Discussed/Used     Consult Status Consult Status: PRN Follow-up type: In-patient  [place], [place] 09/21/2019, 2:13 PM

## 2019-09-22 ENCOUNTER — Telehealth: Payer: Self-pay

## 2019-09-22 ENCOUNTER — Telehealth (INDEPENDENT_AMBULATORY_CARE_PROVIDER_SITE_OTHER): Payer: Medicaid Other | Admitting: Advanced Practice Midwife

## 2019-09-22 ENCOUNTER — Encounter: Payer: Self-pay | Admitting: Advanced Practice Midwife

## 2019-09-22 DIAGNOSIS — R42 Dizziness and giddiness: Secondary | ICD-10-CM

## 2019-09-22 DIAGNOSIS — I959 Hypotension, unspecified: Secondary | ICD-10-CM

## 2019-09-22 DIAGNOSIS — R55 Syncope and collapse: Secondary | ICD-10-CM

## 2019-09-22 MED ORDER — ENALAPRIL MALEATE 10 MG PO TABS: 10.0000 mg | ORAL_TABLET | Freq: Every day | ORAL | 5 refills | Status: DC

## 2019-09-22 NOTE — Progress Notes (Signed)
Pt has felt like she was going to pass out today. BP was 90/54 earlier and 116/64 recently.

## 2019-09-22 NOTE — Progress Notes (Signed)
TELEHEALTH VIRTUAL VIDEO VISIT ENCOUNTER NOTE   Provider location: [facility] for [facility] at [place] connected with [name] on 09/22/19 at  3:35 PM EST by MyChart Video Encounter at home and verified that I am speaking with the correct person using two identifiers.    I discussed the limitations, risks, security and privacy concerns of performing an evaluation and management service virtually and the availability of in person appointments. I also discussed with the patient that there may be a patient responsible charge related to this service. The patient expressed understanding and agreed to proceed.  Chief Complaint: Dizziness, near syncope  History of Present Illness: Maria Payne is a 29 y.o. Caucasian G1P0101 being evaluated for dizziness and near syncope.   She is s/p preterm vaginal delivery with IOL for severe PEC on 09/15/19 at 29 weeks 1 day; she was discharged to home on 09/18/19 D#3. Pregnancy complicated by HTN, severe PEC. Baby is doing well in NICU.  Complains of dizziness and near syncope this morning, home BP was low even after eating and drinking fluids.   Review of Systems: Positive for dizziness and fatigue. Her 12 point review of systems is negative or as noted in the History of Present Illness.  Patient Active Problem List   Diagnosis Date Noted  . Acute blood loss anemia 09/16/2019  . [redacted] weeks gestation of pregnancy 09/15/2019  . Chest pain 09/14/2019  . Status post ventriculoatrial shunt placement 09/13/2019  . Pneumonia 09/12/2019  . Severe preeclampsia, third trimester 09/11/2019  . Severe persistent asthma with exacerbation 09/09/2019  . Hypertension 08/14/2019  . Supervision of high risk pregnancy, antepartum 04/23/2019  . Obesity in pregnancy 04/23/2019  . Shunt malfunction 04/20/2017  . 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 Pain of upper abdomen 11/21/2015  . Psychic factors associated with diseases classified  elsewhere 12/31/2013  . MDD (major depressive disorder) 04/04/2012  . GAD (generalized anxiety disorder) 04/04/2012  . 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 IIH (idiopathic intracranial hypertension) 09/10/2011  . Papilledema associated with increased intracranial pressure 08/20/2011  . DERMATITIS, PERIORAL 05/17/2010    Medications [name] had no medications administered during this visit. Current Outpatient Medications  Medication Sig Dispense Refill  . acetaminophen (TYLENOL) 500 MG tablet Take 1,000 mg by mouth 2 (two) times daily.    [name] Kitchen albuterol (VENTOLIN HFA) 108 (90 Base) MCG/ACT inhaler Inhale 2 puffs into the lungs every 4 (four) hours as needed for wheezing.    [name] Kitchen aspirin EC 81 MG tablet Take 1 tablet (81 mg total) by mouth daily. 90 tablet 3  . enalapril (VASOTEC) 20 MG tablet Take 1 tablet (20 mg total) by mouth daily. 30 tablet 1  . escitalopram (LEXAPRO) 20 MG tablet Take 1 tablet (20 mg total) by mouth daily. 30 tablet 6  . furosemide (LASIX) 40 MG tablet Take 1 tablet (40 mg total) by mouth 2 (two) times daily. 30 tablet 0  . labetalol (NORMODYNE) 200 MG tablet Take 2 tablets (400 mg total) by mouth 2 (two) times daily. 60 tablet 1  . Prenatal Vit-Fe Fumarate-FA (MULTIVITAMIN-PRENATAL) 27-0.8 MG TABS tablet Take 1 tablet by mouth daily at 12 noon.     No current facility-administered medications for this visit.    Allergies Latex, Nsaids, Codeine, Hydrocodone-acetaminophen, Oxycodone, and Tape  Physical Exam:  There were no vitals taken for this visit. Pt home BP today was 90/54 and retake was 116/64 General:  Alert, oriented and  cooperative. Patient is in no acute distress.  Mental Status: Normal mood and affect. Normal behavior. Normal judgment and thought content.   Respiratory: Normal respiratory effort noted, no problems with respiration noted  Rest of physical exam deferred due to type of encounter       Assessment/Plan:   1.  Dizziness   2. Near syncope --Pt reports she has passed out before and she had the same symptoms with ringing in her ears, dizziness, fuzzy vision this morning.  She laid down in bed and symptoms subsided but dizziness remains when standing.  3. Hypotension, unspecified hypotension type --BP 90s/50s today. Pt reports no more edema of extremities "I am back to normal before pregnancy" --Pt had severe PEC and severe range BPs immediately PP so was discharged with Vasotec 20 mg daily, labetalol 400 mg BID, and Lasix 40 mg daily. --Discontinue labetalol and Lasix and reduce Vasotec to 10 mg daily. --F/U in 2 days in office for BP check as scheduled but continue BPs daily, if elevated 140/90s or higher, take 20 mg Vasotec daily.   Follow up in: 2 days or as needed.    I discussed the assessment and treatment plan with the patient. The patient was provided an opportunity to ask questions and all were answered. The patient agreed with the plan and demonstrated an understanding of the instructions.   The patient was advised to call back or seek an in-person evaluation/go to the ED for any concerning postpartum symptoms.  I provided 10 minutes of face-to-face time during this encounter.   Fatima Blank, [place] for [facility], [place]

## 2019-09-22 NOTE — Telephone Encounter (Signed)
Pt called stating that she felt like she was going to pass out and her BP was 104/50. Pt was instructed to increase fluids, continue eating and check her BP again. Pt is aware if she does not start to feel better then she should go to [facility] for evaluation. Pt expressed understanding.

## 2019-09-24 ENCOUNTER — Encounter: Payer: Self-pay | Admitting: Obstetrics and Gynecology

## 2019-09-24 ENCOUNTER — Other Ambulatory Visit: Payer: Self-pay

## 2019-09-24 ENCOUNTER — Ambulatory Visit (INDEPENDENT_AMBULATORY_CARE_PROVIDER_SITE_OTHER): Payer: Medicaid Other | Admitting: Obstetrics and Gynecology

## 2019-09-24 NOTE — Progress Notes (Signed)
29 yo POD#10 s/p induction of labor at 29 weeks due to severe preeclampsia here today for BP check. Patient recently seen virtually on 09/22/19 due to near syncopal episodes and hypotension. Patient was on labetalol, vasotec and lasix. She was instructed to discontinue all medication and stay on a lower dose of vasotec (10 mg daily instead of 20 mg daily). Patient reports feeling much better since change in medication and denies any further episodes of near syncope  Past Medical History:  Diagnosis Date  . Asthma   . 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 Had diagnostic cerebral arteriogram.     . Depression   . GAD (generalized anxiety disorder) 04/04/2012  . Papilledema    Diamox  . Pseudotumor cerebri    Past Surgical History:  Procedure Laterality Date  . ANGIOPLASTY  2012   ? cerebral artery   . CHOLECYSTECTOMY    . SHUNT REVISION     4 times, last time 07/24/19  . SLEEVE GASTROPLASTY  05/2014   Dr. Evorn Gong   . Stent placed  August 2014   Stenosis right transverse sigmoid junction,[facility]  . VENTRICULAR ATRIAL SHUNT    . VP shunt  11/2013   [facility] at [facility]   Family History  Problem Relation Age of Onset  . Hypertension Father   . Heart disease Father   . Diabetes Father   . Diabetes Mother    Social History   Tobacco Use  . Smoking status: Never Smoker  . Smokeless tobacco: Never Used  Substance Use Topics  . Alcohol use: Not Currently    Alcohol/week: 0.0 standard drinks    Comment: occ  . Drug use: No   ROS See pertinent in HPI  Blood pressure 123/82, pulse (!) 101, temperature 98.2 F (36.8 C), resp. rate 16, height 5\' 7"  (1.702 m), weight 249 lb (112.9 kg), currently breastfeeding. GENERAL: Well-developed, well-nourished female in no acute distress.  EXTREMITIES: No cyanosis, clubbing, or edema, 2+ distal pulses.  A/P 28 yo POD#10 s/p SVD at 29 weeks secondary to severe preeclampsia - BP normal today  - Continue on vasotec 10 mg  daily until postpartum visit - Patient breastfeeding and discussed ways to increase milk production. Reminded patient that lactation consultant is still available to her - All questions were answered

## 2019-09-29 ENCOUNTER — Encounter (HOSPITAL_COMMUNITY): Payer: Self-pay | Admitting: Obstetrics and Gynecology

## 2019-10-02 ENCOUNTER — Encounter: Payer: Self-pay | Admitting: Certified Nurse Midwife

## 2019-10-02 ENCOUNTER — Telehealth (INDEPENDENT_AMBULATORY_CARE_PROVIDER_SITE_OTHER): Payer: Medicaid Other | Admitting: Certified Nurse Midwife

## 2019-10-02 ENCOUNTER — Other Ambulatory Visit: Payer: Self-pay

## 2019-10-02 NOTE — Progress Notes (Signed)
TELEHEALTH VIRTUAL VIDEO VISIT ENCOUNTER NOTE   Provider location: [facility] for [facility] at [place] connected with [name] on 10/02/19 at  9:15 AM EST by MyChart Video Encounter at home and verified that I am speaking with the correct person using two identifiers.    I discussed the limitations, risks, security and privacy concerns of performing an evaluation and management service virtually and the availability of in person appointments. I also discussed with the patient that there may be a patient responsible charge related to this service. The patient expressed understanding and agreed to proceed.  Chief Complaint: BP check   History of Present Illness: Maria Payne is a 29 y.o. Caucasian G1P0101 being evaluated for postpartum BP check.    She is s/p normal spontaneous vaginal delivery on 09/15/19 at 29 weeks due to severe preeclampsia. Initially discharged home on 400mg  Labetalol and 20mg  Vasotec. Patient reports being seen on 1/12 for hypotension and syncopal episodes- was discontinued off of labetalol and vasotec decreased to 10mg .   Complains of headache 2 days ago - took BP which was 164/92 - reports she had not taken medication prior to elevation of BP. Patient reports taking Vasotec daily   BP today 124/84  Review of Systems:Her 12 point review of systems is negative or as noted in the History of Present Illness.  Patient Active Problem List   Diagnosis Date Noted  . Acute blood loss anemia 09/16/2019  . [redacted] weeks gestation of pregnancy 09/15/2019  . Chest pain 09/14/2019  . Status post ventriculoatrial shunt placement 09/13/2019  . Pneumonia 09/12/2019  . Severe preeclampsia, third trimester 09/11/2019  . Severe persistent asthma with exacerbation 09/09/2019  . Hypertension 08/14/2019  . Supervision of high risk pregnancy, antepartum 04/23/2019  . Obesity in pregnancy 04/23/2019  . Shunt malfunction 04/20/2017  . S/P ventriculoperitoneal shunt  11/30/2016  . Pain of upper abdomen 11/21/2015  . Psychic factors associated with diseases classified elsewhere 12/31/2013  . MDD (major depressive disorder) 04/04/2012  . GAD (generalized anxiety disorder) 04/04/2012  . 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 IIH (idiopathic intracranial hypertension) 09/10/2011  . Papilledema associated with increased intracranial pressure 08/20/2011  . DERMATITIS, PERIORAL 05/17/2010    Medications [name] had no medications administered during this visit. Current Outpatient Medications  Medication Sig Dispense Refill  . aspirin EC 81 MG tablet Take 1 tablet (81 mg total) by mouth daily. 90 tablet 3  . enalapril (VASOTEC) 10 MG tablet Take 1 tablet (10 mg total) by mouth daily. 30 tablet 5  . escitalopram (LEXAPRO) 20 MG tablet Take 1 tablet (20 mg total) by mouth daily. 30 tablet 6  . acetaminophen (TYLENOL) 500 MG tablet Take 1,000 mg by mouth 2 (two) times daily.    [name] Kitchen albuterol (VENTOLIN HFA) 108 (90 Base) MCG/ACT inhaler Inhale 2 puffs into the lungs every 4 (four) hours as needed for wheezing.    . Prenatal Vit-Fe Fumarate-FA (MULTIVITAMIN-PRENATAL) 27-0.8 MG TABS tablet Take 1 tablet by mouth daily at 12 noon.     No current facility-administered medications for this visit.    Allergies Latex, Nsaids, Codeine, Hydrocodone-acetaminophen, Oxycodone, and Tape  Physical Exam:  General:  Alert, oriented and cooperative. Patient is in no acute distress.  Mental Status: Normal mood and affect. Normal behavior. Normal judgment and thought content.   Respiratory: Normal respiratory effort noted, no problems with respiration noted  Rest of physical exam deferred due to type of encounter  Assessment:Patient is a 29 y.o. G1P0101 who is 17 days postpartum from a normal spontaneous vaginal delivery.  She is doing well.   Plan: 1. Postpartum state - Continue medication as prescribed  - Call office if she continues to have headache  and/or hypotension - can decreased medication to 5mg  at that time  - Follow up as scheduled for postpartum appointment on 2/1   I discussed the assessment and treatment plan with the patient. The patient was provided an opportunity to ask questions and all were answered. The patient agreed with the plan and demonstrated an understanding of the instructions.   The patient was advised to call back or seek an in-person evaluation/go to the ED for any concerning postpartum symptoms.  I provided 8 minutes of face-to-face time during this encounter.   Lajean Manes, [place] for [facility], [place]

## 2019-10-03 ENCOUNTER — Ambulatory Visit: Payer: Self-pay

## 2019-10-03 ENCOUNTER — Encounter: Payer: Self-pay | Admitting: Family Medicine

## 2019-10-03 NOTE — Lactation Note (Signed)
This note was copied from a baby's chart. Lactation Consultation Note  Patient Name: Maria Payne [id number] Date: 10/03/2019 Reason for consult: Follow-up assessment   LC Follow Up Visit:  RN requested lactation consult.  Mother is experiencing dryness and soreness while pumping as of last night.  Observed nipples to be intact without breakdown.  Suggested mother use her EBM to rub into nipples/areolas after every pumping and to cover with coconut oil.  Provided comfort gels to take home with instructions for use.  Mother placed the comfort gels as soon as I provided them and she was pleasantly surprised how good they felt.  Parents are hoping to return tomorrow for a visit and mother will call if she has any further concerns regarding her nipple soreness.  Also suggested that she observe her flange size to be sure she has the correct fit.  Discussed correct fit and mother will call for my assistance tomorrow as needed.  Family was ready to leave the hospital for the day when I arrived.  Family appreciative.    Consult Status Consult Status: PRN Date: 10/03/19 Follow-up type: Call as needed    Avner Stroder R Amirra Herling 10/03/2019, 5:10 PM

## 2019-10-05 NOTE — Telephone Encounter (Signed)
Patient scheduled.

## 2019-10-05 NOTE — Telephone Encounter (Signed)
We should be able to do a shave biopsy on it here

## 2019-10-07 ENCOUNTER — Other Ambulatory Visit: Payer: Self-pay

## 2019-10-07 ENCOUNTER — Encounter: Payer: Self-pay | Admitting: Family Medicine

## 2019-10-07 ENCOUNTER — Other Ambulatory Visit (HOSPITAL_COMMUNITY)
Admission: RE | Admit: 2019-10-07 | Discharge: 2019-10-07 | Disposition: A | Discharge status: Home / Self Care | Payer: Medicaid Other | Source: Ambulatory Visit | Attending: Family Medicine | Admitting: Family Medicine

## 2019-10-07 ENCOUNTER — Ambulatory Visit (INDEPENDENT_AMBULATORY_CARE_PROVIDER_SITE_OTHER): Payer: Medicaid Other | Admitting: Family Medicine

## 2019-10-07 VITALS — BP 139/79 | HR 87 | Ht 67.0 in | Wt 253.0 lb

## 2019-10-07 DIAGNOSIS — D229 Melanocytic nevi, unspecified: Secondary | ICD-10-CM | POA: Diagnosis not present

## 2019-10-07 DIAGNOSIS — F53 Postpartum depression: Secondary | ICD-10-CM

## 2019-10-07 DIAGNOSIS — B372 Candidiasis of skin and nail: Secondary | ICD-10-CM

## 2019-10-07 DIAGNOSIS — O99345 Other mental disorders complicating the puerperium: Secondary | ICD-10-CM

## 2019-10-07 NOTE — Addendum Note (Signed)
Addended by: Teddy Spike on: 10/07/2019 01:58 PM   Modules accepted: Orders

## 2019-10-07 NOTE — Patient Instructions (Signed)
Keep covered until tomorrow morning if able to.  If there is a little bit of drainage and you need to change the bandage sooner than that is perfectly fine.  Tomorrow okay to take a shower and get them wet.  Okay to just gently use your fingertips and a little bit of antibacterial soap to clean.  Do not scrub at the area.  Do not apply alcohol or peroxide or iodine.  Pat dry after the shower and apply a small dab of Vaseline or Aquaphor twice a day for 2 weeks.  If at any point you have any concerns about how the wound is looking or if there is any drainage then let us know.  You do not have to keep them covered.  But certainly if you want to cover the 1 on the lower breast area just make sure that it is loosely covered with a breathable bandage preferably a cotton gauze.

## 2019-10-07 NOTE — Progress Notes (Addendum)
Acute Office Visit  Subjective:    Patient ID: Maria Payne, female    DOB: 01-14-1991, 29 y.o.   MRN: [id number]  Chief Complaint  Patient presents with  . Nevus    under L breast    HPI Patient is in today for a couple of lesions on her skin that she would like me to look at.  In particular she has a lesion underneath the left breast crease that has been there for years but recently since having a baby and trying to pump she says it is gotten really inflamed and irritated and very bothersome and has also gotten larger. Unfortunately she had to be induced early and her daughter is in the NICU but doing well.  She also has 2 lesions on her mid chest that she would like me to look at.  The top 1 in particular has also been bothersome is also gotten larger and more tender and seems to be protruding more and catching on clothes.  Also has a lesion on her left facial cheek that is also been there for years and it grows hair and wanted to know about that 1 as well it has not been as irritating or tender.  Past Medical History:  Diagnosis Date  . Asthma   . 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 Had diagnostic cerebral arteriogram.     . Depression   . GAD (generalized anxiety disorder) 04/04/2012  . Papilledema    Diamox  . Pseudotumor cerebri     Past Surgical History:  Procedure Laterality Date  . ANGIOPLASTY  2012   ? cerebral artery   . CHOLECYSTECTOMY    . SHUNT REVISION     4 times, last time 07/24/19  . SLEEVE GASTROPLASTY  05/2014   Dr. Evorn Gong   . Stent placed  August 2014   Stenosis right transverse sigmoid junction,[facility]  . VENTRICULAR ATRIAL SHUNT    . VP shunt  11/2013   Done at [facility]    Family History  Problem Relation Age of Onset  . Hypertension Father   . Heart disease Father   . Diabetes Father   . Diabetes Mother     Social History   Socioeconomic History  . Marital status: Married    Spouse name: Not on file   . Number of children: 0  . Years of education: Not on file  . Highest education level: Not on file  Occupational History  . Occupation: unemployed.   . Occupation: CUSTOMER SERVICE    Employer: [employer]  Tobacco Use  . Smoking status: Never Smoker  . Smokeless tobacco: Never Used  Substance and Sexual Activity  . Alcohol use: Not Currently    Alcohol/week: 0.0 standard drinks    Comment: occ  . Drug use: No  . Sexual activity: Yes    Partners: Male    Birth control/protection: None  Other Topics Concern  . Not on file  Social History Narrative   seperated from husband in nov 2014.  Still legally married.    Social Determinants of Health   Financial Resource Strain:   . Difficulty of Paying Living Expenses: Not on file  Food Insecurity:   . Worried About Charity fundraiser in the Last Year: Not on file  . Ran Out of Food in the Last Year: Not on file  Transportation Needs:   . Lack of Transportation (Medical): Not on file  . Lack of Transportation (  Non-Medical): Not on file  Physical Activity:   . Days of Exercise per Week: Not on file  . Minutes of Exercise per Session: Not on file  Stress:   . Feeling of Stress : Not on file  Social Connections:   . Frequency of Communication with Friends and Family: Not on file  . Frequency of Social Gatherings with Friends and Family: Not on file  . Attends Religious Services: Not on file  . Active Member of Clubs or Organizations: Not on file  . Attends Archivist Meetings: Not on file  . Marital Status: Not on file  Intimate Partner Violence:   . Fear of Current or Ex-Partner: Not on file  . Emotionally Abused: Not on file  . Physically Abused: Not on file  . Sexually Abused: Not on file    Outpatient Medications Prior to Visit  Medication Sig Dispense Refill  . acetaminophen (TYLENOL) 500 MG tablet Take 1,000 mg by mouth 2 (two) times daily.    [name] Kitchen albuterol (VENTOLIN HFA) 108 (90 Base) MCG/ACT inhaler Inhale 2  puffs into the lungs every 4 (four) hours as needed for wheezing.    [name] Kitchen aspirin EC 81 MG tablet Take 1 tablet (81 mg total) by mouth daily. 90 tablet 3  . enalapril (VASOTEC) 10 MG tablet Take 1 tablet (10 mg total) by mouth daily. 30 tablet 5  . escitalopram (LEXAPRO) 20 MG tablet Take 1 tablet (20 mg total) by mouth daily. 30 tablet 6  . Prenatal Vit-Fe Fumarate-FA (MULTIVITAMIN-PRENATAL) 27-0.8 MG TABS tablet Take 1 tablet by mouth daily at 12 noon.     No facility-administered medications prior to visit.    Allergies  Allergen Reactions  . Latex     Other reaction(s): Other Diagnosed with blood test  . Nsaids     Other reaction(s): Other contraindication  . Codeine   . Hydrocodone-Acetaminophen Rash    Headaches Morphine, Dilaudid OK  . Oxycodone Rash    Headaches  . Tape Rash    welps    Review of Systems     Objective:    Physical Exam Vitals reviewed.  Constitutional:      Appearance: She is well-developed.  HENT:     Head: Normocephalic and atraumatic.  Eyes:     Conjunctiva/sclera: Conjunctivae normal.  Cardiovascular:     Rate and Rhythm: Normal rate.  Pulmonary:     Effort: Pulmonary effort is normal.  Skin:    General: Skin is dry.     Coloration: Skin is not pale.     Comments: Papular large oval shaped lesion under her left breast.  Has 2 similar lesion on her mid chest.  She has a more smooth brown papular lesion on her left facial cheek near the jaw line.    Neurological:     Mental Status: She is alert and oriented to person, place, and time.  Psychiatric:        Behavior: Behavior normal.     BP 139/79   Pulse 87   Ht 5\' 7"  (1.702 m)   Wt 253 lb (114.8 kg)   SpO2 100%   Breastfeeding Yes   BMI 39.63 kg/m  Wt Readings from Last 3 Encounters:  10/07/19 253 lb (114.8 kg)  09/24/19 249 lb (112.9 kg)  09/14/19 (!) 301 lb 12 oz (136.9 kg)    There are no preventive care reminders to display for this patient.  There are no preventive  care reminders to display for this  patient.   Lab Results  Component Value Date   TSH 3.720 03/23/2014   Lab Results  Component Value Date   WBC 9.6 09/17/2019   HGB 8.3 (L) 09/17/2019   HCT 25.6 (L) 09/17/2019   MCV 84.8 09/17/2019   PLT 243 09/17/2019   Lab Results  Component Value Date   NA 136 09/16/2019   K 4.3 09/16/2019   CO2 24 09/16/2019   GLUCOSE 84 09/16/2019   BUN 20 09/16/2019   CREATININE 0.68 09/16/2019   BILITOT 0.3 09/16/2019   ALKPHOS 53 09/16/2019   AST 16 09/16/2019   ALT 15 09/16/2019   PROT 4.2 (L) 09/16/2019   ALBUMIN 1.7 (L) 09/16/2019   CALCIUM 7.0 (L) 09/16/2019   ANIONGAP 5 09/16/2019   Lab Results  Component Value Date   CHOL 191 03/23/2014   Lab Results  Component Value Date   HDL 38 (L) 03/23/2014   Lab Results  Component Value Date   LDLCALC 124 (H) 03/23/2014   Lab Results  Component Value Date   TRIG 147 03/23/2014   Lab Results  Component Value Date   CHOLHDL 5.0 03/23/2014   Lab Results  Component Value Date   HGBA1C 5.3 04/23/2019       Assessment & Plan:   Problem List Items Addressed This Visit    None    Visit Diagnoses    Atypical nevus    -  Primary   Relevant Orders   Surgical pathology   Post partum depression       Relevant Orders   Ambulatory referral to [place]   Skin candidiasis       Relevant Medications   nystatin (MYCOSTATIN/NYSTOP) powder      Postpartum depression-she does have a history of major depressive disorder and is interested in seeing a therapist/counselor.  We will work on try to get a referral set up for her.   Skin candidiasis - I do feel she has some mild yeast infection under the breast as she has had excess moisture and odor. Will tc with nystatin powder.     Shave Biopsy Procedure Note  Pre-operative Diagnosis: atypical nevus x 2   Post-operative Diagnosis: same  Locations:left lower breast crease and upper mid chest   Indications: Change in size and  inflammation   Anesthesia: Lidocaine 1% with epinephrine without added sodium bicarbonate  Procedure Details  Patient informed of the risks (including bleeding and infection) and benefits of the  procedure and Verbal informed consent obtained.  The lesion and surrounding area were given a sterile prep using chlorhexidine and draped in the usual sterile fashion. A scalpel was used to shave an area of skin approximately 0.5cm by 1.2 cm from under the left breast.  The lesion on the mid-upper chest is approx 1cm X 1 cm.  Hemostasis achieved with alumuninum chloride. Antibiotic ointment and a sterile dressing applied.  The specimen was sent for pathologic examination. The patient tolerated the procedure well.  EBL: trace   Findings: Await pathology  Condition: Stable  Complications: none.  Plan: 1. Instructed to keep the wound dry and covered for 24-48h and clean thereafter. 2. Warning signs of infection were reviewed.   3. Recommended that the patient use OTC acetaminophen as needed for pain.  4. Return PRN.    Meds ordered this encounter  Medications  . nystatin (MYCOSTATIN/NYSTOP) powder    Sig: Apply 1 application topically 3 (three) times daily as needed.    Dispense:  60  g    Refill:  1     [name], MD

## 2019-10-07 NOTE — Progress Notes (Signed)
She stated that the area has been there for years however, it has gotten worse since she began pumping. She reports that it has changed in color and size and has an odor to it.  She also would like a mole on her L cheek to be examined today.

## 2019-10-08 MED ORDER — NYSTATIN 100000 UNIT/GM EX POWD: 1.0000 "application " | Freq: Three times a day (TID) | CUTANEOUS | 1 refills | Status: DC | PRN

## 2019-10-08 NOTE — Addendum Note (Signed)
Addended by: [name] D on: 10/08/2019 08:11 PM   Modules accepted: Orders

## 2019-10-09 ENCOUNTER — Encounter: Payer: Self-pay | Admitting: Family Medicine

## 2019-10-10 DIAGNOSIS — B349 Viral infection, unspecified: Secondary | ICD-10-CM | POA: Diagnosis not present

## 2019-10-10 DIAGNOSIS — Z20822 Contact with and (suspected) exposure to covid-19: Secondary | ICD-10-CM | POA: Diagnosis not present

## 2019-10-12 ENCOUNTER — Ambulatory Visit: Payer: Medicaid Other | Admitting: Obstetrics & Gynecology

## 2019-10-12 LAB — SURGICAL PATHOLOGY

## 2019-10-12 MED ORDER — MUPIROCIN 2 % EX OINT: TOPICAL_OINTMENT | Freq: Two times a day (BID) | CUTANEOUS | 0 refills | Status: DC

## 2019-10-16 ENCOUNTER — Encounter: Payer: Self-pay | Admitting: Family Medicine

## 2019-10-16 ENCOUNTER — Ambulatory Visit (INDEPENDENT_AMBULATORY_CARE_PROVIDER_SITE_OTHER): Payer: Medicaid Other | Admitting: Family Medicine

## 2019-10-16 VITALS — BP 131/90 | HR 83 | Temp 98.3°F | Ht 67.0 in | Wt 253.0 lb

## 2019-10-16 DIAGNOSIS — Z4889 Encounter for other specified surgical aftercare: Secondary | ICD-10-CM | POA: Diagnosis not present

## 2019-10-16 DIAGNOSIS — U071 COVID-19: Secondary | ICD-10-CM | POA: Diagnosis not present

## 2019-10-16 MED ORDER — DOXYCYCLINE HYCLATE 100 MG PO TABS: 100.0000 mg | ORAL_TABLET | Freq: Two times a day (BID) | ORAL | 0 refills | Status: DC

## 2019-10-16 NOTE — Progress Notes (Signed)
Pt reports that the area under her L breast is not getting any better. She stated that the area is red, draining(greenish in color), no fevers since she was Dx with COVID on Sunday.

## 2019-10-16 NOTE — Progress Notes (Signed)
Virtual Visit via Video Note  I connected with Maria Payne on 10/17/19 at  2:40 PM EST by a video enabled telemedicine application and verified that I am speaking with the correct person using two identifiers.   I discussed the limitations of evaluation and management by telemedicine and the availability of in person appointments. The patient expressed understanding and agreed to proceed.  Subjective:    CC: Wound "not healing"  HPI: Pt reports that the area under her L breast is not getting any better. She stated that the area is red, draining(greenish in color), no fevers.no read streaks.  She has been using the mupiricin ointment for the last 2 days and using the Nystop powder as well. The wound stings. Still pumping breast milk for her baby who is still in the NICU.    She was also Dx with COVID on Sunday. She is doing OK but mostly had cold symptoms. No fever.  Whole family has COVID.     Past medical history, Surgical history, Family history not pertinant except as noted below, Social history, Allergies, and medications have been entered into the medical record, reviewed, and corrections made.   Review of Systems: No fevers, chills, night sweats, weight loss, chest pain, or shortness of breath.   Objective:    General: Speaking clearly in complete sentences without any shortness of breath.  Alert and oriented x3.  Normal judgment. No apparent acute distress.    Impression and Recommendations:    Wound under left breast.  Based on appearance not worse but not better.  Will likely take longer to heal bc of location.  Continue current treatment through the weekend. If not better then plan to use Duoderm. She will let us know.  Try ot moisturize but keep dry. If gets worse OK to start the doxycyline ( of for nursing for short course).     [place] to use OTC meds.  Call if any worsening sxs.       I discussed the assessment and treatment plan with the patient. The  patient was provided an opportunity to ask questions and all were answered. The patient agreed with the plan and demonstrated an understanding of the instructions.   The patient was advised to call back or seek an in-person evaluation if the symptoms worsen or if the condition fails to improve as anticipated.  Time spent 20 min in encounter.   [name], MD

## 2019-10-17 ENCOUNTER — Encounter: Payer: Self-pay | Admitting: Family Medicine

## 2019-10-20 ENCOUNTER — Encounter: Payer: Self-pay | Admitting: Family Medicine

## 2019-10-21 ENCOUNTER — Ambulatory Visit (INDEPENDENT_AMBULATORY_CARE_PROVIDER_SITE_OTHER): Payer: Medicaid Other | Admitting: Family Medicine

## 2019-10-21 DIAGNOSIS — Z4889 Encounter for other specified surgical aftercare: Secondary | ICD-10-CM | POA: Diagnosis not present

## 2019-10-21 NOTE — Telephone Encounter (Signed)
Let get her shed on nurse visit for application of Duoderm this afternoon

## 2019-10-21 NOTE — Telephone Encounter (Signed)
She was diagnosed on 2/1 with COVID and has had SX... what do you recommend

## 2019-10-21 NOTE — Progress Notes (Signed)
Spoke w/pt about how to apply the dressing,NOT TO GET THIS WET,and when to change. . She voiced understanding and agreed. Maria Payne, Lahoma Crocker, CMA

## 2019-10-27 ENCOUNTER — Encounter: Payer: Self-pay | Admitting: Nurse Practitioner

## 2019-10-27 DIAGNOSIS — Z8759 Personal history of other complications of pregnancy, childbirth and the puerperium: Secondary | ICD-10-CM | POA: Insufficient documentation

## 2019-10-28 ENCOUNTER — Encounter: Payer: Self-pay | Admitting: Nurse Practitioner

## 2019-10-28 ENCOUNTER — Other Ambulatory Visit: Payer: Self-pay

## 2019-10-28 ENCOUNTER — Ambulatory Visit (INDEPENDENT_AMBULATORY_CARE_PROVIDER_SITE_OTHER): Payer: Medicaid Other | Admitting: Nurse Practitioner

## 2019-10-28 DIAGNOSIS — Z8759 Personal history of other complications of pregnancy, childbirth and the puerperium: Secondary | ICD-10-CM

## 2019-10-28 DIAGNOSIS — Z30011 Encounter for initial prescription of contraceptive pills: Secondary | ICD-10-CM | POA: Diagnosis not present

## 2019-10-28 DIAGNOSIS — Z8751 Personal history of pre-term labor: Secondary | ICD-10-CM | POA: Insufficient documentation

## 2019-10-28 DIAGNOSIS — Z6838 Body mass index (BMI) 38.0-38.9, adult: Secondary | ICD-10-CM

## 2019-10-28 MED ORDER — NORETHINDRONE 0.35 MG PO TABS: 1.0000 | ORAL_TABLET | Freq: Every day | ORAL | 11 refills | Status: DC

## 2019-10-28 NOTE — Addendum Note (Signed)
Addended by: Teddy Spike on: 10/28/2019 11:55 AM   Modules accepted: Level of Service

## 2019-10-28 NOTE — Progress Notes (Signed)
Post Partum Exam  Maria Payne is a 29 y.o. G13P0101 female who presents for a postpartum visit. She is 6 weeks postpartum following a IOL due to [name] vaginal delivery. I have fully reviewed the prenatal and intrapartum course. The delivery was at 29.1 gestational weeks.  Anesthesia: epidural. Postpartum course has been remarkable and had Covid post partum.. Baby's course has been remarkable and in NICU. Baby is feeding by breast. Bleeding no bleeding. Bowel function is normal. Bladder function is normal. Patient is not sexually active. Contraception method is vasectomy. Postpartum depression screening:pos Currently baby is in NICU and expected to be discharged home in 2 weeks.  Currently 5 lbs and 7 ounces.  Beginnning to breastfeed at the breast. Taking Vasotec 10 mg PO daily for BP.  The following portions of the patient's history were reviewed and updated as appropriate: allergies, current medications, past family history, past medical history, past social history, past surgical history and problem list. Last pap smear done 2018 and was Normal  Review of Systems Pertinent items noted in HPI and remainder of comprehensive ROS otherwise negative.    Objective:  currently breastfeeding.  General:  alert, cooperative and no distress   Breasts:  deferred - breastfeeding  Lungs: clear to auscultation bilaterally  Heart:  regular rate and rhythm, S1, S2 normal, no murmur, click, rub or gallop  Abdomen: deferred   Vulva: Pelvic deferred  Vagina:   Cervix:    Corpus:   Adnexa:    Rectal Exam:         Assessment:    Normal postpartum exam. Pap smear not done at today's visit.   Plan:   1. Contraception: oral progesterone-only contraceptive for the next 3 months- new prescription.  Husband will be getting vasectomy next month.  Advised condoms for 3 weeks as backup.  Make sure husband has follow up after vasectomy for sperm count and then OK to stop pills. 2. Discussed weight loss  as advised to BMI less than 25 due to severe pre-eclampsia and a preterm birth.  This puts client at higher risk of continuing hypertension and early cardiac disease.  Reduce risk if possible by maintaining a healthy weight.  Continue follow up with your PCP and continue Vasotec until instructed by PCP to stop. 3.  Advised to discuss use of rescue asthma inhaler with PCP.  Using much more frequently after Covid.  Does she need a daily maintenance medication?? 3. Follow up in: 1 year or as needed.   Earlie Server, RN, MSN, NP-BC Nurse Practitioner, [facility] for [employer], [place] [facility] 10/28/2019 10:30 AM

## 2019-11-02 ENCOUNTER — Ambulatory Visit: Payer: Self-pay

## 2019-11-02 NOTE — Lactation Note (Signed)
This note was copied from a baby's chart. Lactation Consultation Note  Patient Name: Girl Karicia Saffle [id number] Date: 11/02/2019   Spoke with Abran Richard SLP and mother would benefit from latch assistance tomorrow since mother has left for the day.  LC will follow up at 11:30a on 11/03/19.      Maternal Data    Feeding Feeding Type: Breast Milk Nipple Type: Nfant Extra Slow Flow (gold)  LATCH Score                   Interventions    Lactation Tools Discussed/Used     Consult Status      Carlye Grippe 11/02/2019, 3:03 PM

## 2019-11-03 ENCOUNTER — Ambulatory Visit: Payer: Self-pay

## 2019-11-03 NOTE — Lactation Note (Signed)
This note was copied from a baby's chart. Lactation Consultation Note  Patient Name: Maria Payne [id number] Date: 11/03/2019 Reason for consult: Follow-up assessment;NICU baby;[place] Baby is 45 weeks old in the NICU.  She is now 36.1 PMA.  Mom has been pumping every 2-3 hours during the day and 5 hours once at night.  Mom reports she had an abundant supply until she had covid a few weeks ago.  She is now pumping 30-60 mls per breast.  Mom states she has 200 bags of milk in the freezer at home.  Mom is not putting baby to breast often.  She has used a nipple shield.  Discussed normal preterm feeding expectation.  Recommended an outpatient appointment after baby's discharge.  Assisted with positioning baby in football hold.  Mom easily hand expressed a drop of milk.  Baby very sleepy and showing no signs of hunger.  20 mm nipple shield applied but baby only held nipple in her mouth with no suck elicited.  Reassured mom.  Encouraged to continue attempting latch.  Instructed to call with concerns prn.  Maternal Data    Feeding Feeding Type: Breast Fed Nipple Type: Nfant Extra Slow Flow (gold)  LATCH Score Latch: Too sleepy or reluctant, no latch achieved, no sucking elicited.  Audible Swallowing: None  Type of Nipple: Everted at rest and after stimulation  Comfort (Breast/Nipple): Soft / non-tender  Hold (Positioning): Assistance needed to correctly position infant at breast and maintain latch.  LATCH Score: 5  Interventions    Lactation Tools Discussed/Used Tools: Nipple Shields Nipple shield size: 20   Consult Status Consult Status: PRN Follow-up type: Call as needed    ELWYN, IVERY 11/03/2019, 11:59 AM

## 2020-01-05 ENCOUNTER — Ambulatory Visit: Payer: Medicaid Other | Admitting: Sports Medicine

## 2020-01-11 ENCOUNTER — Ambulatory Visit: Payer: Medicaid Other | Admitting: Sports Medicine

## 2020-02-05 DIAGNOSIS — G932 Benign intracranial hypertension: Secondary | ICD-10-CM | POA: Diagnosis not present

## 2020-02-25 DIAGNOSIS — Z982 Presence of cerebrospinal fluid drainage device: Secondary | ICD-10-CM | POA: Diagnosis not present

## 2020-02-25 DIAGNOSIS — G932 Benign intracranial hypertension: Secondary | ICD-10-CM | POA: Diagnosis not present

## 2020-04-15 ENCOUNTER — Encounter: Payer: Self-pay | Admitting: Family Medicine

## 2020-04-18 NOTE — Telephone Encounter (Signed)
Appointment has been made for a virtual. No further questions at this time.

## 2020-04-25 ENCOUNTER — Encounter: Payer: Self-pay | Admitting: Family Medicine

## 2020-04-25 ENCOUNTER — Telehealth (INDEPENDENT_AMBULATORY_CARE_PROVIDER_SITE_OTHER): Payer: Medicaid Other | Admitting: Family Medicine

## 2020-04-25 VITALS — Ht 68.0 in

## 2020-04-25 DIAGNOSIS — R197 Diarrhea, unspecified: Secondary | ICD-10-CM | POA: Insufficient documentation

## 2020-04-25 NOTE — Progress Notes (Signed)
Pt reports that since she delivered her baby she is has BM's mostly all day. She has changed the way and what she eats. Denies any weight loss associated with this.   She reports that the stools are not solid they are liquid in consistency and orange neon in color like she's pooping out acid. She said that she doesn't eat a lot of orange,red,yellow colored things. She reports that its mostly diarrhea and she has her gallbladder removed and yesterday she experienced tight cramping pains throughout her belly. Her father has hx of diverticulitis and IBS.

## 2020-04-25 NOTE — Progress Notes (Signed)
Virtual Visit via Video Note  I connected with Maria Payne on 04/25/20 at 10:10 AM EDT by a video enabled telemedicine application and verified that I am speaking with the correct person using two identifiers.   I discussed the limitations of evaluation and management by telemedicine and the availability of in person appointments. The patient expressed understanding and agreed to proceed.  Patient location: at home  Provider location: in office  Subjective:    CC:   HPI:  Pt reports that since she delivered her baby in [place] is has BM's mostly all day. They are frequent and loose.  Seem worse in theAM and then again in midafternnon. She rarely gets "normal formed" BMS  She has changed the way and what she eats. Denies any weight loss associated with this.   She reports that the stools are not solid they are liquid in consistency and orange neon in color like she's pooping out acid. She said that she doesn't eat a lot of orange,red,yellow colored things. She reports that its mostly diarrhea and she has her gallbladder removed and yesterday she experienced tight cramping pains throughout her belly. Her father has hx of diverticulitis and IBS.  No nausea or vomiting.  No unusual rashes.  No recent hiking.  No travel outside of the country.  She is not been able to determine any specific triggers.  No family history of Crohn's disease or ulcerative colitis.  She did have a cholecystectomy around 2018.   Past medical history, Surgical history, Family history not pertinant except as noted below, Social history, Allergies, and medications have been entered into the medical record, reviewed, and corrections made.   Review of Systems: No fevers, chills, night sweats, weight loss, chest pain, or shortness of breath.   Objective:    General: Speaking clearly in complete sentences without any shortness of breath.  Alert and oriented x3.  Normal judgment. No apparent acute  distress.    Impression and Recommendations:    Diarrhea Chronic.  She reports that she was having some loose stools before she delivered her baby in January but it has been chronic and persistent since then.  This is a little bit irregular from typical IBS pattern.  So we will start with stool cultures etc.  Also evaluate for gluten enteropathy and Crohn's disease with blood work.  If normal then refer to GI for further work-up.  In the meantime work on eating a more high-fiber diet.       Time spent in encounter 21 minutes  I discussed the assessment and treatment plan with the patient. The patient was provided an opportunity to ask questions and all were answered. The patient agreed with the plan and demonstrated an understanding of the instructions.   The patient was advised to call back or seek an in-person evaluation if the symptoms worsen or if the condition fails to improve as anticipated.   [name], MD

## 2020-04-25 NOTE — Assessment & Plan Note (Signed)
Chronic.  She reports that she was having some loose stools before she delivered her baby in January but it has been chronic and persistent since then.  This is a little bit irregular from typical IBS pattern.  So we will start with stool cultures etc.  Also evaluate for gluten enteropathy and Crohn's disease with blood work.  If normal then refer to GI for further work-up.  In the meantime work on eating a more high-fiber diet.

## 2020-04-26 ENCOUNTER — Encounter: Payer: Self-pay | Admitting: Internal Medicine

## 2020-04-27 DIAGNOSIS — R197 Diarrhea, unspecified: Secondary | ICD-10-CM | POA: Diagnosis not present

## 2020-04-28 DIAGNOSIS — R197 Diarrhea, unspecified: Secondary | ICD-10-CM | POA: Diagnosis not present

## 2020-04-30 LAB — SALMONELLA/SHIGELLA CULT, CAMPY EIA AND SHIGA TOXIN RFL ECOLI
MICRO NUMBER: 10849579
MICRO NUMBER:: 10849580
MICRO NUMBER:: 10849581
Result:: NOT DETECTED
SHIGA RESULT:: NOT DETECTED
SPECIMEN QUALITY: ADEQUATE
SPECIMEN QUALITY:: ADEQUATE
SPECIMEN QUALITY:: ADEQUATE

## 2020-04-30 LAB — CLOSTRIDIUM DIFFICILE TOXIN B, QUALITATIVE, REAL-TIME PCR: Toxigenic C. Difficile by PCR: NOT DETECTED

## 2020-05-01 LAB — TEST AUTHORIZATION: TEST CODE:: 32114

## 2020-05-01 LAB — CBC WITH DIFFERENTIAL/PLATELET
Absolute Monocytes: 366 cells/uL (ref 200–950)
Basophils Absolute: 19 cells/uL (ref 0–200)
Basophils Relative: 0.3 %
Eosinophils Absolute: 81 cells/uL (ref 15–500)
Eosinophils Relative: 1.3 %
HCT: 39.6 % (ref 35.0–45.0)
Hemoglobin: 12.9 g/dL (ref 11.7–15.5)
Lymphs Abs: 1197 cells/uL (ref 850–3900)
MCH: 27.9 pg (ref 27.0–33.0)
MCHC: 32.6 g/dL (ref 32.0–36.0)
MCV: 85.5 fL (ref 80.0–100.0)
MPV: 9.7 fL (ref 7.5–12.5)
Monocytes Relative: 5.9 %
Neutro Abs: 4538 cells/uL (ref 1500–7800)
Neutrophils Relative %: 73.2 %
Platelets: 306 10*3/uL (ref 140–400)
RBC: 4.63 10*6/uL (ref 3.80–5.10)
RDW: 13 % (ref 11.0–15.0)
Total Lymphocyte: 19.3 %
WBC: 6.2 10*3/uL (ref 3.8–10.8)

## 2020-05-01 LAB — TISSUE TRANSGLUTAMINASE ABS,IGG,IGA
(tTG) Ab, IgA: 1 U/mL
(tTG) Ab, IgG: 1 U/mL

## 2020-05-01 LAB — SACCHAROMYCES CEREVISIAE ANTIBODIES, IGG AND IGA
SACCHAROMYCES CEREVISIAE AB (ASCA)(IGA): 5.9 U (ref <=20.0)
SACCHAROMYCES CEREVISIAE AB (ASCA)(IGG): 41.8 U — ABNORMAL HIGH (ref <=20.0)

## 2020-05-19 DIAGNOSIS — Z982 Presence of cerebrospinal fluid drainage device: Secondary | ICD-10-CM | POA: Diagnosis not present

## 2020-05-19 DIAGNOSIS — G932 Benign intracranial hypertension: Secondary | ICD-10-CM | POA: Diagnosis not present

## 2020-05-30 DIAGNOSIS — G932 Benign intracranial hypertension: Secondary | ICD-10-CM | POA: Diagnosis not present

## 2020-05-30 DIAGNOSIS — Z982 Presence of cerebrospinal fluid drainage device: Secondary | ICD-10-CM | POA: Diagnosis not present

## 2020-06-28 ENCOUNTER — Ambulatory Visit (INDEPENDENT_AMBULATORY_CARE_PROVIDER_SITE_OTHER): Payer: Medicaid Other | Admitting: Internal Medicine

## 2020-06-28 ENCOUNTER — Encounter: Payer: Self-pay | Admitting: Internal Medicine

## 2020-06-28 VITALS — BP 122/80 | HR 82 | Ht 68.0 in | Wt 269.0 lb

## 2020-06-28 DIAGNOSIS — R899 Unspecified abnormal finding in specimens from other organs, systems and tissues: Secondary | ICD-10-CM | POA: Diagnosis not present

## 2020-06-28 DIAGNOSIS — K219 Gastro-esophageal reflux disease without esophagitis: Secondary | ICD-10-CM

## 2020-06-28 DIAGNOSIS — Z6841 Body Mass Index (BMI) 40.0 and over, adult: Secondary | ICD-10-CM

## 2020-06-28 DIAGNOSIS — R197 Diarrhea, unspecified: Secondary | ICD-10-CM | POA: Diagnosis not present

## 2020-06-28 MED ORDER — SUTAB 1479-225-188 MG PO TABS: 1.0000 | ORAL_TABLET | Freq: Once | ORAL | 0 refills | Status: AC

## 2020-06-28 NOTE — Progress Notes (Signed)
HISTORY OF PRESENT ILLNESS:  Maria Payne is a 29 y.o. 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  She presents today with chief complaint of persistent diarrhea and concerns over Crohn's disease.  Patient does have chronic GERD for which she takes occasional Tums.  She tells me that since her pregnancy 18 months ago she has had problems with abdominal cramping followed by urgency and loose stools.  Typically 6 or 7 loose stools in the morning.  Possibly more thereafter.  Rare nocturnal symptoms.  She does see occasional blood in the toilet bowl and on the stool.  She was evaluated via telehealth medicine April 26, 2019, by her PCP.  CBC was unremarkable.  Testing for celiac disease was normal.  All studies were negative.  Testing for Saccharomyces cerevisiae IgG elevated while IgA was normal.  She was told that she may have IBD.  GI appointment arranged she does have some nausea and occasional vomiting since her bariatric surgery.  She also mentions that after her cholecystectomy she would experience a loose stools after eating certain foods.  She did lose 100 pounds after bariatric surgery, but gained 50 pounds of that back.  Her father has a history of both ulcerative colitis and irritable bowel syndrome.  She has recovered from Covid infection earlier this year.  REVIEW OF SYSTEMS:  All non-GI ROS negative unless otherwise stated in the HPI except for sinus allergy, anxiety, arthritis, back pain, depression, headaches, hearing problems, menstrual pain  Past Medical History:  Diagnosis Date  . Asthma   . 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 Had diagnostic cerebral arteriogram.     . Depression   . GAD (generalized anxiety disorder) 04/04/2012  . Papilledema    Diamox   . Pseudotumor cerebri     Past Surgical History:  Procedure Laterality Date  . ANGIOPLASTY  2012   ? cerebral artery   . CHOLECYSTECTOMY    . SHUNT REVISION     4 times, last time 07/24/19  . SLEEVE GASTROPLASTY  05/2014   Dr. Evorn Gong   . Stent placed  August 2014   Stenosis right transverse sigmoid junction,[facility]  . VENTRICULAR ATRIAL SHUNT    . VP shunt  11/2013   Done at [place]  reports that she has never smoked. She has never used smokeless tobacco. She reports previous alcohol use. She reports that she does not use drugs.  family history includes Diabetes in her father and mother; Heart disease in her father; Hypertension in her father.  Allergies  Allergen Reactions  . Latex     Other reaction(s): Other Diagnosed with blood test  . Nsaids     Other reaction(s): Other contraindication  . Codeine   . Hydrocodone-Acetaminophen Rash    Headaches Morphine, Dilaudid OK  . Oxycodone Rash    Headaches  . Tape Rash    welps       PHYSICAL EXAMINATION: Vital signs: BP 122/80   Pulse 82   Ht 5\' 8"  (1.727 m)   Wt 269 lb (122 kg)   BMI 40.90 kg/m   Constitutional: generally well-appearing, no acute distress Psychiatric: alert and oriented x3, cooperative Eyes: extraocular movements intact, anicteric, conjunctiva pink Mouth: oral pharynx moist, no lesions Neck: supple no lymphadenopathy Cardiovascular: heart  regular rate and rhythm, no murmur Lungs: clear to auscultation bilaterally Abdomen: soft, nontender, nondistended, no obvious ascites, no peritoneal signs, normal bowel sounds, no organomegaly Rectal: Deferred till colonoscopy Extremities: no clubbing, cyanosis, or lower extremity edema bilaterally Skin: no lesions on visible extremities Neuro: No focal deficits.  Cranial nerves intact  ASSESSMENT:  1.  Problems with abdominal cramping, urgency, diarrhea.  Intermittent bleeding.  Routine blood work, stool studies negative.   Elevated Saccharomyces antibody (IgG only).  Possibilities include microscopic colitis, irritable bowel syndrome or bile salt related diarrhea with rectal bleeding due to benign causes.  Alternatively, she could have inflammatory bowel disease.  Further investigations are certainly warranted 2.  Abnormal blood test 3.  Morbid obesity 4.  GERD 5.  Status post cholecystectomy   PLAN:  1.  Colonoscopy with biopsies.  Patient is high risk due to her body habitus.  Sedation will be provided with monitored anesthesia care.The nature of the procedure, as well as the risks, benefits, and alternatives were carefully and thoroughly reviewed with the patient. Ample time for discussion and questions allowed. The patient understood, was satisfied, and agreed to proceed. 2.  Colonoscopy and biopsies unrevealing, then treatment trial with Colestid.

## 2020-06-28 NOTE — Patient Instructions (Signed)
If you are age 30 or older, your body mass index should be between 23-30. Your Body mass index is 40.9 kg/m. If this is out of the aforementioned range listed, please consider follow up with your Primary Care Provider.  If you are age 26 or younger, your body mass index should be between 19-25. Your Body mass index is 40.9 kg/m. If this is out of the aformentioned range listed, please consider follow up with your Primary Care Provider.    You have been scheduled for a colonoscopy. Please follow written instructions given to you at your visit today.  Please pick up your prep supplies at the pharmacy within the next 1-3 days. If you use inhalers (even only as needed), please bring them with you on the day of your procedure.

## 2020-07-03 ENCOUNTER — Encounter: Payer: Self-pay | Admitting: Family Medicine

## 2020-07-08 DIAGNOSIS — G932 Benign intracranial hypertension: Secondary | ICD-10-CM | POA: Diagnosis not present

## 2020-07-14 DIAGNOSIS — Z982 Presence of cerebrospinal fluid drainage device: Secondary | ICD-10-CM | POA: Diagnosis not present

## 2020-07-14 DIAGNOSIS — G932 Benign intracranial hypertension: Secondary | ICD-10-CM | POA: Diagnosis not present

## 2020-07-20 ENCOUNTER — Encounter: Payer: Self-pay | Admitting: *Deleted

## 2020-07-20 ENCOUNTER — Other Ambulatory Visit: Payer: Self-pay

## 2020-07-20 ENCOUNTER — Ambulatory Visit: Payer: Medicaid Other | Admitting: *Deleted

## 2020-07-20 VITALS — Wt 269.0 lb

## 2020-07-20 DIAGNOSIS — Z3200 Encounter for pregnancy test, result unknown: Secondary | ICD-10-CM

## 2020-07-20 LAB — POCT URINE PREGNANCY: Preg Test, Ur: NEGATIVE

## 2020-07-20 NOTE — Progress Notes (Addendum)
Pt here for UPT.  She states she did 4 @ home 2 were positive and 2 were neg.  She used Clear Blue Easy.  UPT here today is neg.  Her LMP was 06/30/20 and lasted for 4 days.  She states that she does have PCO and has been having Rt pelvic pain about 2 weeks before her cycle starts.  I explained that this is most likely a follicle that has matured and releasing an egg.  No BHCG drawn since UPT neg.  Pt instructed to follow up accordingly. Pt does state that her partner had a vasectomy in March 2021.

## 2020-07-25 DIAGNOSIS — G4733 Obstructive sleep apnea (adult) (pediatric): Secondary | ICD-10-CM | POA: Diagnosis not present

## 2020-07-26 ENCOUNTER — Encounter: Payer: Self-pay | Admitting: Family Medicine

## 2020-07-28 DIAGNOSIS — Z982 Presence of cerebrospinal fluid drainage device: Secondary | ICD-10-CM | POA: Diagnosis not present

## 2020-08-02 DIAGNOSIS — G4719 Other hypersomnia: Secondary | ICD-10-CM | POA: Diagnosis not present

## 2020-08-02 DIAGNOSIS — R0683 Snoring: Secondary | ICD-10-CM | POA: Diagnosis not present

## 2020-08-02 DIAGNOSIS — Z820 Family history of epilepsy and other diseases of the nervous system: Secondary | ICD-10-CM | POA: Diagnosis not present

## 2020-08-02 DIAGNOSIS — G4730 Sleep apnea, unspecified: Secondary | ICD-10-CM | POA: Diagnosis not present

## 2020-08-11 DIAGNOSIS — G932 Benign intracranial hypertension: Secondary | ICD-10-CM | POA: Diagnosis not present

## 2020-08-23 ENCOUNTER — Other Ambulatory Visit: Payer: Self-pay | Admitting: Internal Medicine

## 2020-08-24 LAB — SARS CORONAVIRUS 2 (TAT 6-24 HRS): SARS Coronavirus 2: NEGATIVE

## 2020-08-25 ENCOUNTER — Ambulatory Visit (AMBULATORY_SURGERY_CENTER): Payer: Medicaid Other | Admitting: Internal Medicine

## 2020-08-25 ENCOUNTER — Other Ambulatory Visit: Payer: Self-pay

## 2020-08-25 ENCOUNTER — Encounter: Payer: Self-pay | Admitting: Internal Medicine

## 2020-08-25 VITALS — BP 99/64 | HR 106 | Resp 17 | Ht 68.0 in | Wt 269.0 lb

## 2020-08-25 DIAGNOSIS — R197 Diarrhea, unspecified: Secondary | ICD-10-CM | POA: Diagnosis not present

## 2020-08-25 MED ORDER — SODIUM CHLORIDE 0.9 % IV SOLN: 500.0000 mL | INTRAVENOUS | Status: DC

## 2020-08-25 NOTE — Patient Instructions (Signed)
Thank you for letting us take care of your healthcare needs today.   YOU HAD AN ENDOSCOPIC PROCEDURE TODAY AT THE [facility]:   Refer to the procedure report that was given to you for any specific questions about what was found during the examination.  If the procedure report does not answer your questions, please call your gastroenterologist to clarify.  If you requested that your care partner not be given the details of your procedure findings, then the procedure report has been included in a sealed envelope for you to review at your convenience later.  YOU SHOULD EXPECT: Some feelings of bloating in the abdomen. Passage of more gas than usual.  Walking can help get rid of the air that was put into your GI tract during the procedure and reduce the bloating. If you had a lower endoscopy (such as a colonoscopy or flexible sigmoidoscopy) you may notice spotting of blood in your stool or on the toilet paper. If you underwent a bowel prep for your procedure, you may not have a normal bowel movement for a few days.  Please Note:  You might notice some irritation and congestion in your nose or some drainage.  This is from the oxygen used during your procedure.  There is no need for concern and it should clear up in a day or so.  SYMPTOMS TO REPORT IMMEDIATELY:  Following lower endoscopy (colonoscopy or flexible sigmoidoscopy):  Excessive amounts of blood in the stool  Significant tenderness or worsening of abdominal pains  Swelling of the abdomen that is new, acute  Fever of 100F or higher  For urgent or emergent issues, a gastroenterologist can be reached at any hour by calling [phone]. Do not use MyChart messaging for urgent concerns.    DIET:  We do recommend a small meal at first, but then you may proceed to your regular diet.  Drink plenty of fluids but you should avoid alcoholic beverages for 24 hours.  ACTIVITY:  You should plan to take it easy for the rest of today and  you should NOT DRIVE or use heavy machinery until tomorrow (because of the sedation medicines used during the test).    FOLLOW UP: Our staff will call the number listed on your records 48-72 hours following your procedure to check on you and address any questions or concerns that you may have regarding the information given to you following your procedure. If we do not reach you, we will leave a message.  We will attempt to reach you two times.  During this call, we will ask if you have developed any symptoms of COVID 19. If you develop any symptoms (ie: fever, flu-like symptoms, shortness of breath, cough etc.) before then, please call [phone].  If you test positive for Covid 19 in the 2 weeks post procedure, please call and report this information to us.    If any biopsies were taken you will be contacted by phone or by letter within the next 1-3 weeks.  Please call us at [phone] if you have not heard about the biopsies in 3 weeks.    SIGNATURES/CONFIDENTIALITY: You and/or your care partner have signed paperwork which will be entered into your electronic medical record.  These signatures attest to the fact that that the information above on your After Visit Summary has been reviewed and is understood.  Full responsibility of the confidentiality of this discharge information lies with you and/or your care-partner.  

## 2020-08-25 NOTE — Progress Notes (Signed)
Called to room to assist during endoscopic procedure.  Patient ID and intended procedure confirmed with present staff. Received instructions for my participation in the procedure from the performing physician.  

## 2020-08-25 NOTE — Op Note (Signed)
[place] Patient Name: Maria Payne Procedure Date: 08/25/2020 12:17 PM MRN: [id number] Endoscopist: Docia Chuck. Henrene Pastor , MD Age: 29 Referring MD:  Date of Birth: [birth year] Gender: Female Account #: [id number] Procedure:                Colonoscopy with biopsies Indications:              Clinically significant diarrhea of unexplained                            origin Medicines:                Monitored Anesthesia Care Procedure:                Pre-Anesthesia Assessment:                           - Prior to the procedure, a History and Physical                            was performed, and patient medications and                            allergies were reviewed. The patient's tolerance of                            previous anesthesia was also reviewed. The risks                            and benefits of the procedure and the sedation                            options and risks were discussed with the patient.                            All questions were answered, and informed consent                            was obtained. Prior Anticoagulants: The patient has                            taken no previous anticoagulant or antiplatelet                            agents. ASA Grade Assessment: II - A patient with                            mild systemic disease. After reviewing the risks                            and benefits, the patient was deemed in                            satisfactory condition to undergo the procedure.  After obtaining informed consent, the colonoscope                            was passed under direct vision. Throughout the                            procedure, the patient's blood pressure, pulse, and                            oxygen saturations were monitored continuously. The                            Olympus CF-HQ190L [id number]) Colonoscope was                            introduced through the anus and advanced to the  the                            cecum, identified by appendiceal orifice and                            ileocecal valve. The terminal ileum, ileocecal                            valve, appendiceal orifice, and rectum were                            photographed. The quality of the bowel preparation                            was excellent. The colonoscopy was performed                            without difficulty. The patient tolerated the                            procedure well. The bowel preparation used was                            SUPREP via split dose instruction. Scope In: 12:34:31 PM Scope Out: 12:48:20 PM Scope Withdrawal Time: 0 hours 10 minutes 45 seconds  Total Procedure Duration: 0 hours 13 minutes 49 seconds  Findings:                 The terminal ileum appeared normal.                           The entire examined colon appeared normal on direct                            and retroflexion views. Biopsies for histology were                            taken with a cold forceps from the entire colon for  evaluation of microscopic colitis. Complications:            No immediate complications. Estimated blood loss:                            None. Estimated Blood Loss:     Estimated blood loss: none. Impression:               - The examined portion of the ileum was normal.                           - The entire examined colon is normal on direct and                            retroflexion views.                           - NO CROHN'S DISEASE Recommendation:           - Patient has a contact number available for                            emergencies. The signs and symptoms of potential                            delayed complications were discussed with the                            patient. Return to normal activities tomorrow.                            Written discharge instructions were provided to the                            patient.                            - Resume previous diet.                           - Continue present medications.                           - Await pathology results. Dr. Henrene Pastor will                            communicate the findings and additional                            recommendations. If biopsies negative for                            microscopic colitis, initiate trial of Colestid for                            possible bile salt related diarrhea. Docia Chuck. Henrene Pastor, MD 08/25/2020 12:57:01 PM This report has been signed electronically.

## 2020-08-25 NOTE — Progress Notes (Signed)
pt tolerated well. VSS. awake and to recovery. Report given to RN.  

## 2020-08-26 DIAGNOSIS — G932 Benign intracranial hypertension: Secondary | ICD-10-CM | POA: Diagnosis not present

## 2020-08-29 ENCOUNTER — Telehealth: Payer: Self-pay

## 2020-08-29 NOTE — Telephone Encounter (Signed)
  Follow up Call-  Call back number 08/25/2020  Post procedure Call Back phone  # [phone]  Permission to leave phone message Yes  Some recent data might be hidden     Patient questions:  Do you have a fever, pain , or abdominal swelling? No. Pain Score  0 *  Have you tolerated food without any problems? Yes.    Have you been able to return to your normal activities? Yes.    Do you have any questions about your discharge instructions: Diet   No. Medications  No. Follow up visit  No.  Do you have questions or concerns about your Care? No.  Actions: * If pain score is 4 or above: No action needed, pain <4. 1. Have you developed a fever since your procedure? no  2.   Have you had an respiratory symptoms (SOB or cough) since your procedure? no  3.   Have you tested positive for COVID 19 since your procedure no  4.   Have you had any family members/close contacts diagnosed with the COVID 19 since your procedure?  no   If yes to any of these questions please route to Joylene John, RN and Joella Prince, RN

## 2020-08-31 ENCOUNTER — Encounter: Payer: Self-pay | Admitting: Internal Medicine

## 2020-09-01 ENCOUNTER — Other Ambulatory Visit: Payer: Self-pay

## 2020-09-01 MED ORDER — COLESTIPOL HCL 1 G PO TABS: 2.0000 g | ORAL_TABLET | Freq: Two times a day (BID) | ORAL | 3 refills | Status: DC

## 2020-09-05 DIAGNOSIS — G932 Benign intracranial hypertension: Secondary | ICD-10-CM | POA: Diagnosis not present

## 2020-09-08 ENCOUNTER — Telehealth: Payer: Medicaid Other | Admitting: Family Medicine

## 2020-09-21 ENCOUNTER — Encounter: Payer: Self-pay | Admitting: Physician Assistant

## 2020-09-21 ENCOUNTER — Telehealth: Payer: Medicaid Other | Admitting: Internal Medicine

## 2020-09-21 ENCOUNTER — Telehealth (INDEPENDENT_AMBULATORY_CARE_PROVIDER_SITE_OTHER): Payer: Medicaid Other | Admitting: Physician Assistant

## 2020-09-21 DIAGNOSIS — J40 Bronchitis, not specified as acute or chronic: Secondary | ICD-10-CM | POA: Diagnosis not present

## 2020-09-21 DIAGNOSIS — J329 Chronic sinusitis, unspecified: Secondary | ICD-10-CM | POA: Diagnosis not present

## 2020-09-21 MED ORDER — PREDNISONE 50 MG PO TABS: ORAL_TABLET | ORAL | 0 refills | Status: DC

## 2020-09-21 MED ORDER — AZITHROMYCIN 250 MG PO TABS: ORAL_TABLET | ORAL | 0 refills | Status: DC

## 2020-09-21 NOTE — Progress Notes (Signed)
Pt stated since christmas she been coughing up brownish color, her back feels soar the symptoms are on and off, took 2 covid test both was negative OTC mucinex.

## 2020-09-21 NOTE — Progress Notes (Signed)
Patient ID: Maria Payne, female   DOB: 05/02/1991, 30 y.o.   MRN: [id number] .[name] KitchenVirtual Visit via Video Note  I connected with SHAIANN MCMANAMON on 09/21/20 at  2:20 PM EST by a video enabled telemedicine application and verified that I am speaking with the correct person using two identifiers.  Location: Patient: home Provider: clinic  .[name] KitchenParticipating in visit:  Patient: Hannan Tetzlaff Provider: Iran Planas Provider in training: Martin Majestic   I discussed the limitations of evaluation and management by telemedicine and the availability of in person appointments. The patient expressed understanding and agreed to proceed.  History of Present Illness: Patient is a 30 year old obese female with asthma who presents to the clinic with almost 3 weeks of intermittent cough, congestion, sinus pressure, chest tightness that is not getting better.  She has been tested for COVID twice and both times negative.  She has been taking over-the-counter medications such as DayQuil, NyQuil, Mucinex, Tylenol, ibuprofen.  She seems to get better and then she takes a turn for the worse.  She is using her albuterol inhaler intermittently.  She has now started to cough up some brown discharge.  She has had pneumonia before.  She started to have some pain with deep breathing.  She denies any fever, chills, body aches, loss of smell or taste, diarrhea  .[name] Kitchen Active Ambulatory Problems    Diagnosis Date Noted  . DERMATITIS, PERIORAL 05/17/2010  . 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 IIH (idiopathic intracranial hypertension) 09/10/2011  . MDD (major depressive disorder) 04/04/2012  . GAD (generalized anxiety disorder) 04/04/2012  . 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 Shunt malfunction 04/20/2017  . Papilledema associated with increased intracranial pressure 08/20/2011  . Pain of upper abdomen 11/21/2015  . Psychic factors associated with diseases classified elsewhere 12/31/2013  . BMI  38.0-38.9,adult 04/23/2019  . Hypertension 08/14/2019  . Severe persistent asthma with exacerbation 09/09/2019  . Status post ventriculoatrial shunt placement 09/13/2019  . History of severe pre-eclampsia 10/27/2019  . History of preterm delivery 10/28/2019  . Anxiety state 12/31/2013  . Diarrhea 04/25/2020   Resolved Ambulatory Problems    Diagnosis Date Noted  . FOOT PAIN 09/25/2010  . Acute maxillary sinusitis 10/20/2010  . Supervision of high risk pregnancy, antepartum 04/23/2019  . Severe preeclampsia, third trimester 09/11/2019  . Pneumonia 09/12/2019  . Chest pain 09/14/2019  . [redacted] weeks gestation of pregnancy 09/15/2019  . Acute blood loss anemia 09/16/2019   Past Medical History:  Diagnosis Date  . Asthma   . Depression   . Papilledema   . Pseudotumor cerebri        Observations/Objective: No acute distress Pale appearance Productive cough with no wheezing or labored breathing    Assessment and Plan: [name] KitchenMarland KitchenDiagnoses and all orders for this visit:  Sinobronchitis -     predniSONE (DELTASONE) 50 MG tablet; One tab PO daily for 5 days. -     azithromycin (ZITHROMAX Z-PAK) 250 MG tablet; Take 2 tablets (500 mg) on  Day 1,  followed by 1 tablet (250 mg) once daily on Days 2 through 5.   Patient has been negative for COVID twice.  It certainly does sound like she had some type of viral URI and now it has developed into a bacterial infection.  Cannot completely rule out pneumonia certainly she has some sinusitis and bronchitis.  Treated with Z-Pak and prednisone today.  Discussed other symptomatic care along with rest and hydration.  Follow-up with any worsening or new symptoms.  Okay to continue using albuterol inhaler every 2-4 hours as needed.   Follow Up Instructions:    I discussed the assessment and treatment plan with the patient. The patient was provided an opportunity to ask questions and all were answered. The patient agreed with the plan and demonstrated an  understanding of the instructions.   The patient was advised to call back or seek an in-person evaluation if the symptoms worsen or if the condition fails to improve as anticipated.    Iran Planas, PA-C

## 2020-09-26 ENCOUNTER — Other Ambulatory Visit: Payer: Self-pay

## 2020-09-26 ENCOUNTER — Encounter: Payer: Self-pay | Admitting: Physician Assistant

## 2020-09-26 MED ORDER — BENZONATATE 200 MG PO CAPS: 200.0000 mg | ORAL_CAPSULE | Freq: Two times a day (BID) | ORAL | 0 refills | Status: DC | PRN

## 2020-09-26 MED ORDER — ALBUTEROL SULFATE HFA 108 (90 BASE) MCG/ACT IN AERS: 2.0000 | INHALATION_SPRAY | RESPIRATORY_TRACT | 1 refills | Status: AC | PRN

## 2020-09-26 NOTE — Telephone Encounter (Signed)
Seen on 09/22/2019 and given prednisone and Zpack. Now Covid positive and still having symptoms, see note.  3 week history of symptoms before visit Patient has asthma

## 2020-09-28 ENCOUNTER — Ambulatory Visit: Payer: Medicaid Other | Admitting: Internal Medicine

## 2020-10-05 ENCOUNTER — Encounter: Payer: Self-pay | Admitting: Physician Assistant

## 2020-10-07 MED ORDER — HYDROCODONE-HOMATROPINE 5-1.5 MG/5ML PO SYRP: 5.0000 mL | ORAL_SOLUTION | Freq: Two times a day (BID) | ORAL | 0 refills | Status: DC | PRN

## 2020-10-17 ENCOUNTER — Telehealth: Payer: Medicaid Other | Admitting: Internal Medicine

## 2020-10-20 DIAGNOSIS — G932 Benign intracranial hypertension: Secondary | ICD-10-CM | POA: Diagnosis not present

## 2020-10-21 DIAGNOSIS — G4730 Sleep apnea, unspecified: Secondary | ICD-10-CM | POA: Diagnosis not present

## 2020-10-21 DIAGNOSIS — G4719 Other hypersomnia: Secondary | ICD-10-CM | POA: Diagnosis not present

## 2020-10-21 DIAGNOSIS — R0683 Snoring: Secondary | ICD-10-CM | POA: Diagnosis not present

## 2020-10-26 ENCOUNTER — Ambulatory Visit: Payer: Medicaid Other | Admitting: Internal Medicine

## 2020-12-23 DIAGNOSIS — R079 Chest pain, unspecified: Secondary | ICD-10-CM | POA: Diagnosis not present

## 2020-12-23 DIAGNOSIS — R0789 Other chest pain: Secondary | ICD-10-CM | POA: Diagnosis not present

## 2020-12-23 DIAGNOSIS — R1013 Epigastric pain: Secondary | ICD-10-CM | POA: Diagnosis not present

## 2020-12-26 ENCOUNTER — Telehealth: Payer: Self-pay

## 2020-12-26 NOTE — Telephone Encounter (Signed)
Transition Care Management Unsuccessful Follow-up Telephone Call  Date of discharge and from where:  [facility] 4/15  Attempts:  1st Attempt  Reason for unsuccessful TCM follow-up call:  Left voice message

## 2020-12-27 NOTE — Telephone Encounter (Signed)
Transition Care Management Follow-up Telephone Call  Date of discharge and from where: 12/23/2020 - [facility]  How have you been since you were released from the hospital? "Better"  Any questions or concerns? No  Items Reviewed:  Did the pt receive and understand the discharge instructions provided? Yes   Medications obtained and verified? Yes   Other? No   Any new allergies since your discharge? No   Dietary orders reviewed? No  Do you have support at home? Yes    Functional Questionnaire: (I = Independent and D = Dependent) ADLs: I  Bathing/Dressing- I  Meal Prep- I  Eating- I  Maintaining continence- I  Transferring/Ambulation- I  Managing Meds- I  Follow up appointments reviewed:   PCP Hospital f/u appt confirmed? No    Specialist Hospital f/u appt confirmed? No    Are transportation arrangements needed? No   If their condition worsens, is the pt aware to call PCP or go to the Emergency Dept.? Yes  Was the patient provided with contact information for the PCP's office or ED? Yes  Was to pt encouraged to call back with questions or concerns? Yes

## 2020-12-28 ENCOUNTER — Encounter: Payer: Self-pay | Admitting: Family Medicine

## 2020-12-29 ENCOUNTER — Telehealth (INDEPENDENT_AMBULATORY_CARE_PROVIDER_SITE_OTHER): Payer: Medicaid Other | Admitting: Family Medicine

## 2020-12-29 ENCOUNTER — Encounter: Payer: Self-pay | Admitting: Family Medicine

## 2020-12-29 DIAGNOSIS — J0100 Acute maxillary sinusitis, unspecified: Secondary | ICD-10-CM | POA: Insufficient documentation

## 2020-12-29 MED ORDER — AMOXICILLIN-POT CLAVULANATE 875-125 MG PO TABS: 1.0000 | ORAL_TABLET | Freq: Two times a day (BID) | ORAL | 0 refills | Status: DC

## 2020-12-29 NOTE — Progress Notes (Signed)
Symptoms started earlier this week. Right ear pain with drainage. Mucus is dark green.   Only took Tylenol for face and head pain.

## 2020-12-29 NOTE — Progress Notes (Signed)
Maria Payne - 30 y.o. female MRN [id number]  Date of birth: 08-05-1991   This visit type was conducted due to national recommendations for restrictions regarding the COVID-19 Pandemic (e.g. social distancing).  This format is felt to be most appropriate for this patient at this time.  All issues noted in this document were discussed and addressed.  No physical exam was performed (except for noted visual exam findings with Video Visits).  I discussed the limitations of evaluation and management by telemedicine and the availability of in person appointments. The patient expressed understanding and agreed to proceed.  I connected with@ on 12/29/20 at  1:30 PM EDT by a video enabled telemedicine application and verified that I am speaking with the correct person using two identifiers.  Present at visit: [name], DO [name]   Patient Location: Home [place] [address]   Provider location:   [place] Complaint  Patient presents with  . Ear Pain    HPI  Maria Payne is a 30 y.o. female who presents via audio/video conferencing for a telehealth visit today.  She has complaint today of sinus pain.  Symptoms started about 5 days ago.  Initially had congestion with clear mucus but evolved into worsening pain in her cheeks and R ear with thick yellow mucus.  She does have pain in her upper teeth. She has not had fever, chills, sore throat, cough, body aches, nausea, vomiting, diarrhea.   She has had some relief with tylenol for pain control.     ROS:  A comprehensive ROS was completed and negative except as noted per HPI  Past Medical History:  Diagnosis Date  . Asthma   . 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 Had diagnostic cerebral arteriogram.     . Depression   . GAD (generalized anxiety disorder) 04/04/2012  . Papilledema    Diamox  . Pseudotumor cerebri     Past Surgical History:  Procedure Laterality  Date  . ANGIOPLASTY  2012   ? cerebral artery   . CHOLECYSTECTOMY    . SHUNT REVISION     4 times, last time 07/24/19  . SLEEVE GASTROPLASTY  05/2014   Dr. Evorn Gong   . Stent placed  August 2014   Stenosis right transverse sigmoid junction,[facility]  . VENTRICULAR ATRIAL SHUNT    . VP shunt  11/2013   Done at [facility]    Family History  Problem Relation Age of Onset  . Hypertension Father   . Heart disease Father   . Diabetes Father   . Diabetes Mother   . Colon cancer Neg Hx   . Stomach cancer Neg Hx   . Pancreatic cancer Neg Hx   . Esophageal cancer Neg Hx   . Rectal cancer Neg Hx     Social History   Socioeconomic History  . Marital status: Married    Spouse name: Not on file  . Number of children: 0  . Years of education: Not on file  . Highest education level: Not on file  Occupational History  . Occupation: unemployed.   . Occupation: CUSTOMER SERVICE    Employer: [employer]  Tobacco Use  . Smoking status: Never Smoker  . Smokeless tobacco: Never Used  Vaping Use  . Vaping Use: Never used  Substance and Sexual Activity  . Alcohol use: Not Currently    Alcohol/week: 0.0 standard drinks    Comment: occ  . Drug  use: No  . Sexual activity: Yes    Partners: Male    Birth control/protection: None  Other Topics Concern  . Not on file  Social History Narrative   seperated from husband in nov 2014.  Still legally married.    Social Determinants of Health   Financial Resource Strain: Not on file  Food Insecurity: Not on file  Transportation Needs: Not on file  Physical Activity: Not on file  Stress: Not on file  Social Connections: Not on file  Intimate Partner Violence: Not on file     Current Outpatient Medications:  .  acetaminophen (TYLENOL) 500 MG tablet, Take 1,000 mg by mouth 2 (two) times daily., Disp: , Rfl:  .  albuterol (VENTOLIN HFA) 108 (90 Base) MCG/ACT inhaler, Inhale 2 puffs into the lungs every 4 (four) hours as needed for wheezing., Disp: 8 g, Rfl:  1 .  amoxicillin-clavulanate (AUGMENTIN) 875-125 MG tablet, Take 1 tablet by mouth 2 (two) times daily., Disp: 20 tablet, Rfl: 0 .  aspirin 81 MG chewable tablet, Chew by mouth daily., Disp: , Rfl:  .  butalbital-acetaminophen-caffeine (FIORICET) 50-325-40 MG tablet, Take 1 tablet by mouth daily as needed., Disp: , Rfl:  .  colestipol (COLESTID) 1 g tablet, Take 2 tablets (2 g total) by mouth 2 (two) times daily., Disp: 120 tablet, Rfl: 3 .  escitalopram (LEXAPRO) 20 MG tablet, Take 1 tablet (20 mg total) by mouth daily., Disp: 30 tablet, Rfl: 6 .  topiramate (TOPAMAX) 25 MG tablet, SMARTSIG:2 By Mouth Twice Daily PRN, Disp: , Rfl:   EXAM:  VITALS per patient if applicable: There were no vitals taken for this visit.  GENERAL: alert, oriented, appears well and in no acute distress  HEENT: atraumatic, conjunttiva clear, no obvious abnormalities on inspection of external nose and ears  NECK: normal movements of the head and neck  LUNGS: on inspection no signs of respiratory distress, breathing rate appears normal, no obvious gross SOB, gasping or wheezing  CV: no obvious cyanosis  MS: moves all visible extremities without noticeable abnormality  PSYCH/NEURO: pleasant and cooperative, no obvious depression or anxiety, speech and thought processing grossly intact  ASSESSMENT AND PLAN:  Discussed the following assessment and plan:  Acute maxillary sinusitis Recommend increased fluids and continue OTC symptomatic and supportive care.  Starting augmentin x10 days.  Discussed scheduling follow up for in person evaluation if symptoms are worsening or not improving.  She expresses understanding.      I discussed the assessment and treatment plan with the patient. The patient was provided an opportunity to ask questions and all were answered. The patient agreed with the plan and demonstrated an understanding of the instructions.   The patient was advised to call back or seek an in-person  evaluation if the symptoms worsen or if the condition fails to improve as anticipated.    [name], DO

## 2020-12-29 NOTE — Assessment & Plan Note (Signed)
Recommend increased fluids and continue OTC symptomatic and supportive care.  Starting augmentin x10 days.  Discussed scheduling follow up for in person evaluation if symptoms are worsening or not improving.  She expresses understanding.

## 2021-01-06 IMAGING — US US MFM OB DETAIL+14 WK
1 series · 13 of 28 positions shown · non-contrast
Comparison: none

[Series 1: us mfm ob detail+14 wk · 13 of 154 slices shown]
[im 6/154]
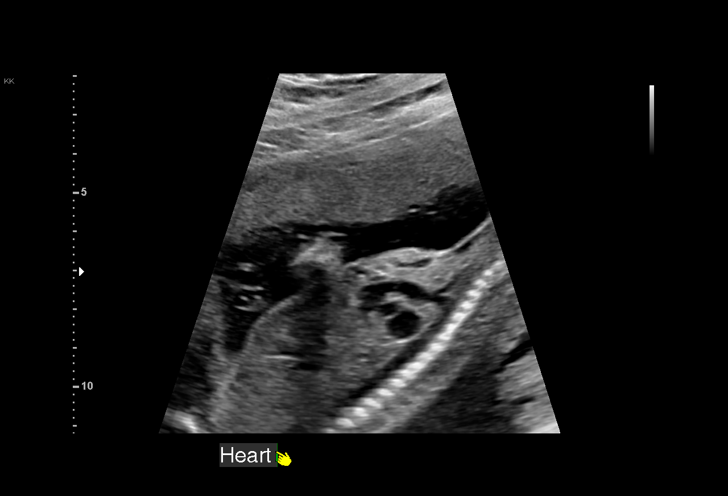
[im 18/154]
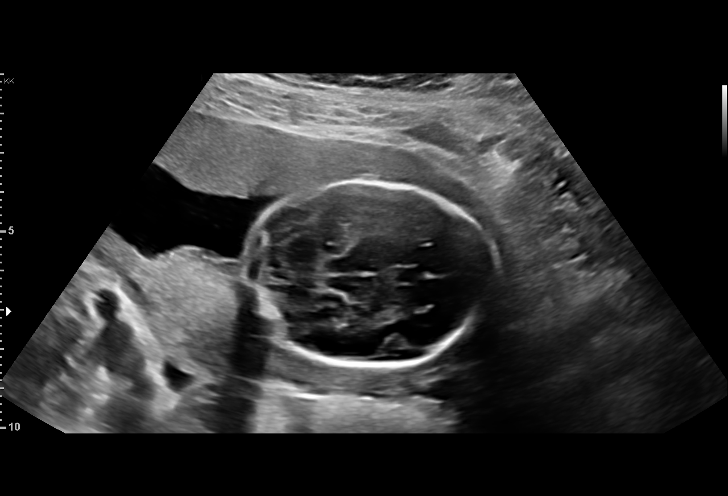
[im 29/154]
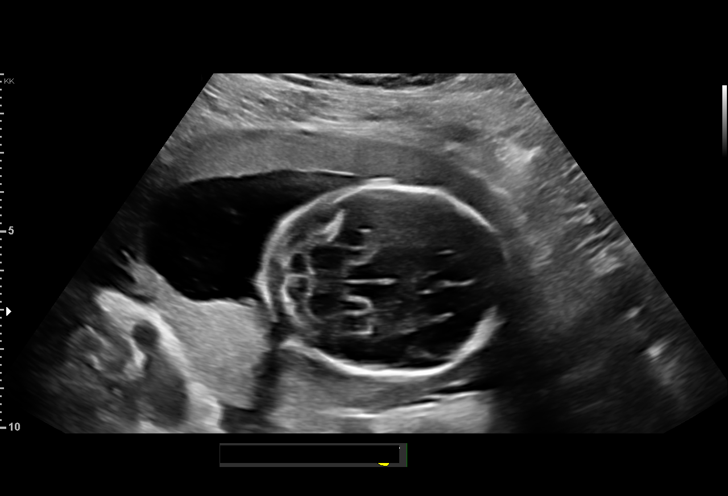
[im 40/154]
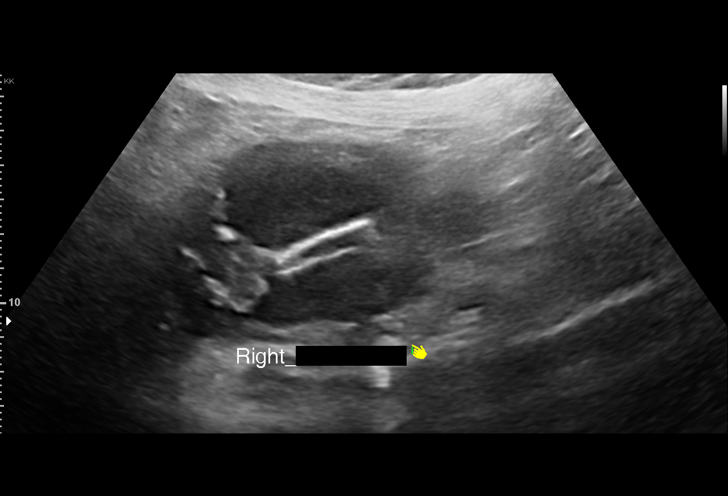
[im 52/154]
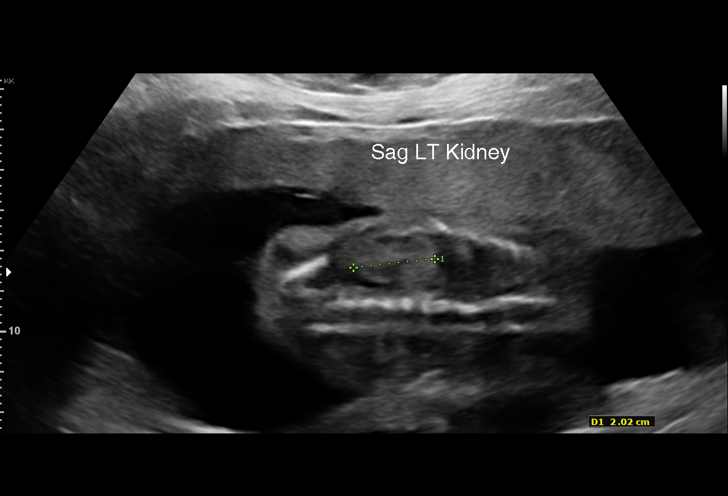
[im 63/154]
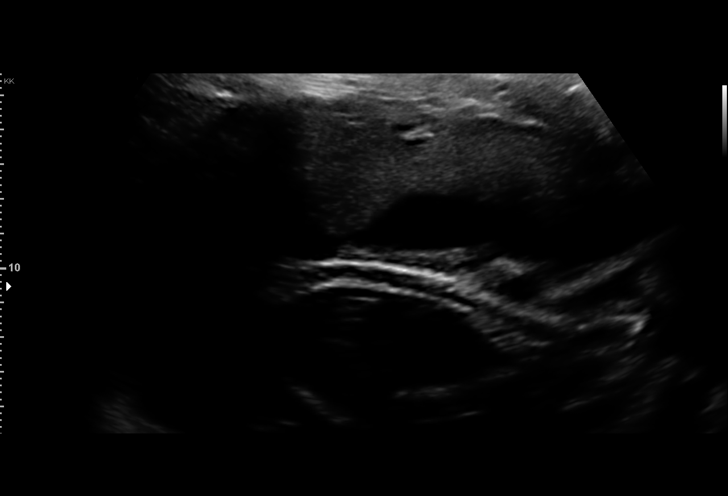
[im 80/154]
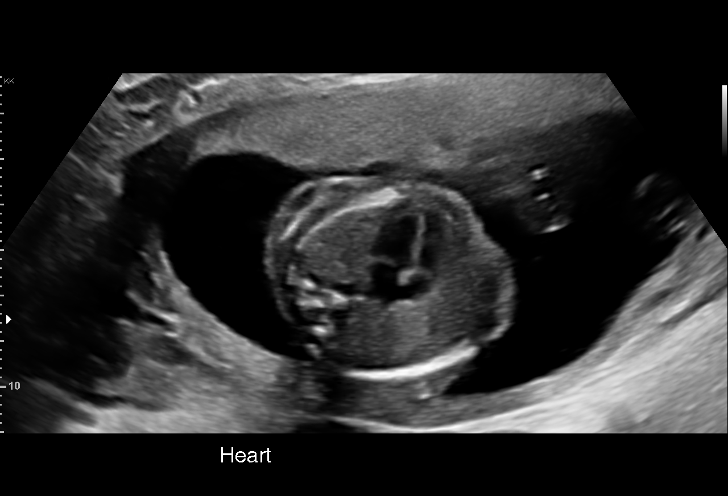
[im 91/154]
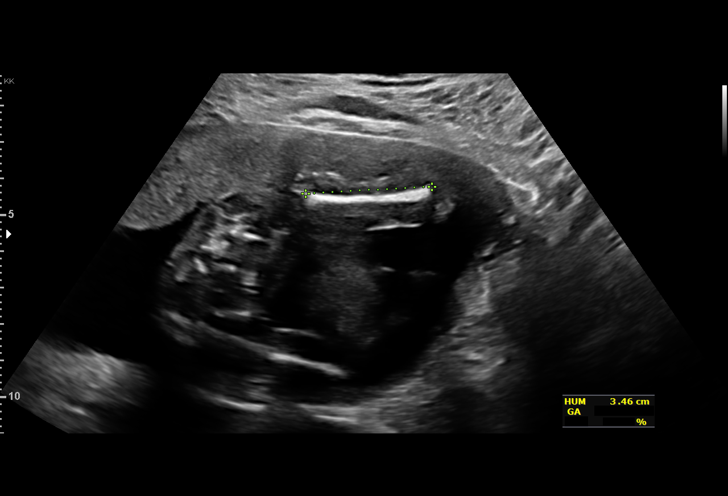
[im 103/154]
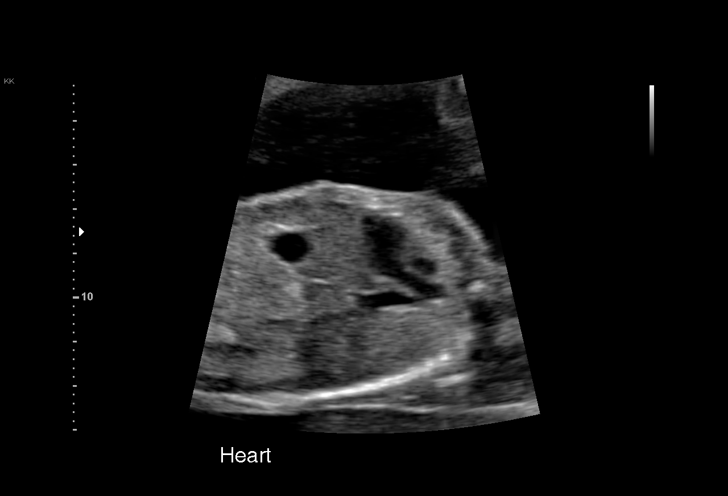
[im 114/154]
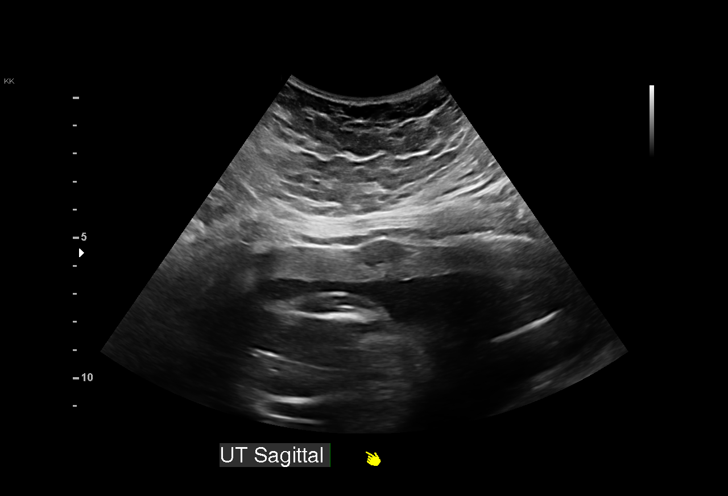
[im 125/154]
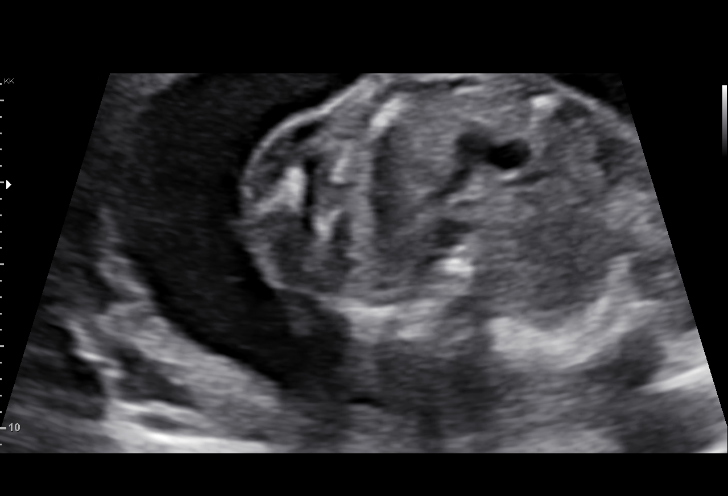
[im 137/154]
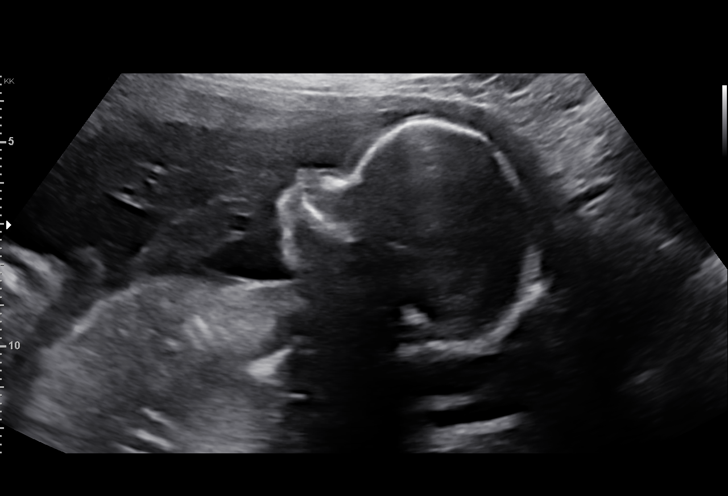
[im 148/154]
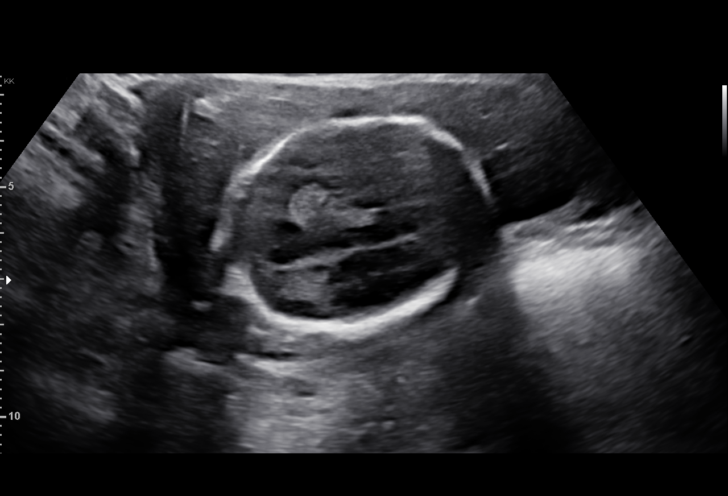

[13 of 28 positions shown; findings below may reference images not displayed]

----------------------------------------------------------------------

 ----------------------------------------------------------------------
Indications

  Medical complication of pregnancy
  (Idopathic Intracranial HTN)(Cerebral
  Venous Sinus Thrombosis)(VP Shunt)
  Encounter for antenatal screening for
  malformations
  Obesity complicating pregnancy, second
  trimester
  Low Risk NIPS
  Uterine fibroids
  20 weeks gestation of pregnancy
 ----------------------------------------------------------------------
Vital Signs

 BMI:
Fetal Evaluation

 Num Of Fetuses:         1
 Fetal Heart Rate(bpm):  153
 Cardiac Activity:       Observed
 Presentation:           Cephalic
 Placenta:               Anterior
 P. Cord Insertion:      Not well visualized

 Amniotic Fluid
 AFI FV:      Within normal limits

                             Largest Pocket(cm)

Biometry

 BPD:      46.7  mm     G. Age:  20w 1d         55  %    CI:        67.03   %    70 - 86
                                                         FL/HC:      18.9   %    16.8 -
 HC:      182.7  mm     G. Age:  20w 5d         72  %    HC/AC:      1.09        1.09 -
 AC:      168.2  mm     G. Age:  21w 6d         92  %    FL/BPD:     73.9   %
 FL:       34.5  mm     G. Age:  20w 6d         73  %    FL/AC:      20.5   %    20 - 24
 HUM:      35.1  mm     G. Age:  22w 0d       > 95  %
 NFT:       4.5  mm

 Est. FW:     411  gm    0 lb 14 oz      97  %
OB History

 Gravidity:    1
Gestational Age

 LMP:           20w 0d        Date:  02/23/19                 EDD:   11/30/19
 U/S Today:     20w 6d                                        EDD:   11/24/19
 Best:          20w 0d     Det. By:  LMP  (02/23/19)          EDD:   11/30/19
Anatomy

 Cranium:               Appears normal         LVOT:                   Appears normal
 Cavum:                 Appears normal         Aortic Arch:            Appears normal
 Ventricles:            Appears normal         Ductal Arch:            Not well visualized
 Choroid Plexus:        Appears normal         Diaphragm:              Not well visualized
 Cerebellum:            Appears normal         Stomach:                Appears normal, left
                                                                       sided
 Posterior Fossa:       Appears normal         Abdomen:                Appears normal
 Nuchal Fold:           Appears normal         Abdominal Wall:         Appears nml (cord
                                                                       insert, abd wall)
 Face:                  Appears normal         Cord Vessels:           Appears normal (3
                        (orbits and profile)                           vessel cord)
 Lips:                  Appears normal         Kidneys:                Not well visualized
 Palate:                Not well visualized    Bladder:                Appears normal
 Thoracic:              Appears normal         Spine:                  Appears normal
 Heart:                 Not well visualized    Upper Extremities:      Appears normal
 RVOT:                  Not well visualized    Lower Extremities:      Appears normal

 Other:  Female gender Technically difficult due to maternal habitus and fetal
         position.
Cervix Uterus Adnexa

 Cervix
 Length:            3.2  cm.
 Normal appearance by transabdominal scan.
Myomas

  Site                     L(cm)      W(cm)      D(cm)      Location
  Anterior
 ----------------------------------------------------------------------

  Blood Flow                 RI        PI       Comments
 ----------------------------------------------------------------------
Comments

 This patient was seen for a detailed fetal anatomy scan due
 to a history of maternal obesity and history of pseudotumor
 cerebri.  She continues to have continued headaches and
 visual issues due to her medical condition.  The patient
 reports that she is scheduled to have her VP shunt redirected
 into her right atrium next week.  This procedure is scheduled
 to be performed by a neurosurgeon at [REDACTED].
 She had a cell free DNA test earlier in her pregnancy which
 indicated a low risk for trisomy 21, 18, and 13. A female fetus
 is predicted.
 She was informed that the fetal growth and amniotic fluid
 level were appropriate for her gestational age.
 There were no obvious fetal anomalies noted on today's
 ultrasound exam.  Due to the fetal position and maternal body
 habitus, the views of the fetal anatomy were suboptimal today.
 The patient was informed that anomalies may be missed due
 to technical limitations. If the fetus is in a suboptimal position
 or maternal habitus is increased, visualization of the fetus in
 the maternal uterus may be impaired.
 A follow-up exam was scheduled in 4 weeks to obtain better
 views of the fetal anatomy.

## 2021-01-11 ENCOUNTER — Encounter: Payer: Self-pay | Admitting: Family Medicine

## 2021-01-11 MED ORDER — METHYLPREDNISOLONE 4 MG PO TBPK: ORAL_TABLET | ORAL | 0 refills | Status: DC

## 2021-01-11 NOTE — Addendum Note (Signed)
Addended by: Narda Rutherford on: 01/11/2021 01:10 PM   Modules accepted: Orders

## 2021-02-22 ENCOUNTER — Ambulatory Visit (INDEPENDENT_AMBULATORY_CARE_PROVIDER_SITE_OTHER): Payer: Medicaid Other

## 2021-02-22 ENCOUNTER — Encounter: Payer: Self-pay | Admitting: Family Medicine

## 2021-02-22 ENCOUNTER — Other Ambulatory Visit: Payer: Self-pay

## 2021-02-22 ENCOUNTER — Ambulatory Visit (INDEPENDENT_AMBULATORY_CARE_PROVIDER_SITE_OTHER): Payer: Medicaid Other | Admitting: Sports Medicine

## 2021-02-22 DIAGNOSIS — G08 Intracranial and intraspinal phlebitis and thrombophlebitis: Secondary | ICD-10-CM | POA: Diagnosis not present

## 2021-02-22 DIAGNOSIS — M5412 Radiculopathy, cervical region: Secondary | ICD-10-CM

## 2021-02-22 DIAGNOSIS — M542 Cervicalgia: Secondary | ICD-10-CM

## 2021-02-22 MED ORDER — FLUCONAZOLE 150 MG PO TABS: 150.0000 mg | ORAL_TABLET | Freq: Once | ORAL | 3 refills | Status: AC

## 2021-02-22 MED ORDER — PREDNISONE 50 MG PO TABS: ORAL_TABLET | ORAL | 0 refills | Status: DC

## 2021-02-22 NOTE — Assessment & Plan Note (Signed)
This is a pleasant 30 year old female with a complex neurologic history including is cerebral venous sinus thrombosis, idiopathic intracranial hypertension, she has a ventricular shunt. Lately she has been having pain in her neck on the left side with radiation down the medial upper arm, medial lower arm to the wrist but not to the fingertips. No constitutional symptoms, no trauma. This is likely a C8 versus T1 radiculitis, adding cervical spine x-rays, considering her history I would like a cervical spine MRI as well. Adding 5 days of prednisone, formal PT.

## 2021-02-22 NOTE — Progress Notes (Signed)
    Procedures performed today:    None.  Independent interpretation of notes and tests performed by another provider:   I reviewed her prior neurosurgery notes from [facility].  Brief History, Exam, Impression, and Recommendations:    Radiculitis of left cervical region This is a pleasant 30 year old female with a complex neurologic history including is cerebral venous sinus thrombosis, idiopathic intracranial hypertension, she has a ventricular shunt. Lately she has been having pain in her neck on the left side with radiation down the medial upper arm, medial lower arm to the wrist but not to the fingertips. No constitutional symptoms, no trauma. This is likely a C8 versus T1 radiculitis, adding cervical spine x-rays, considering her history I would like a cervical spine MRI as well. Adding 5 days of prednisone, formal PT.  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 Has not had brain imaging and a year and a half, with increasing focal neurologic symptoms we do need a brain MRI with and without contrast.    ___________________________________________ Gwen Her. Dianah Field, M.D., ABFM., CAQSM. Primary Care and [place] Instructor of [employer] [facility]

## 2021-02-22 NOTE — Assessment & Plan Note (Signed)
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 Has not had brain imaging and a year and a half, with increasing focal neurologic symptoms we do need a brain MRI with and without contrast.

## 2021-02-28 ENCOUNTER — Encounter: Payer: Self-pay | Admitting: Family Medicine

## 2021-03-07 IMAGING — DX DG CHEST 2V
2 series · 2 of 2 positions shown · non-contrast
Comparison: 08/06/2018

CLINICAL DATA: Chest pain, possible COVID

EXAM:
CHEST - 2 VIEW

[chest pa]
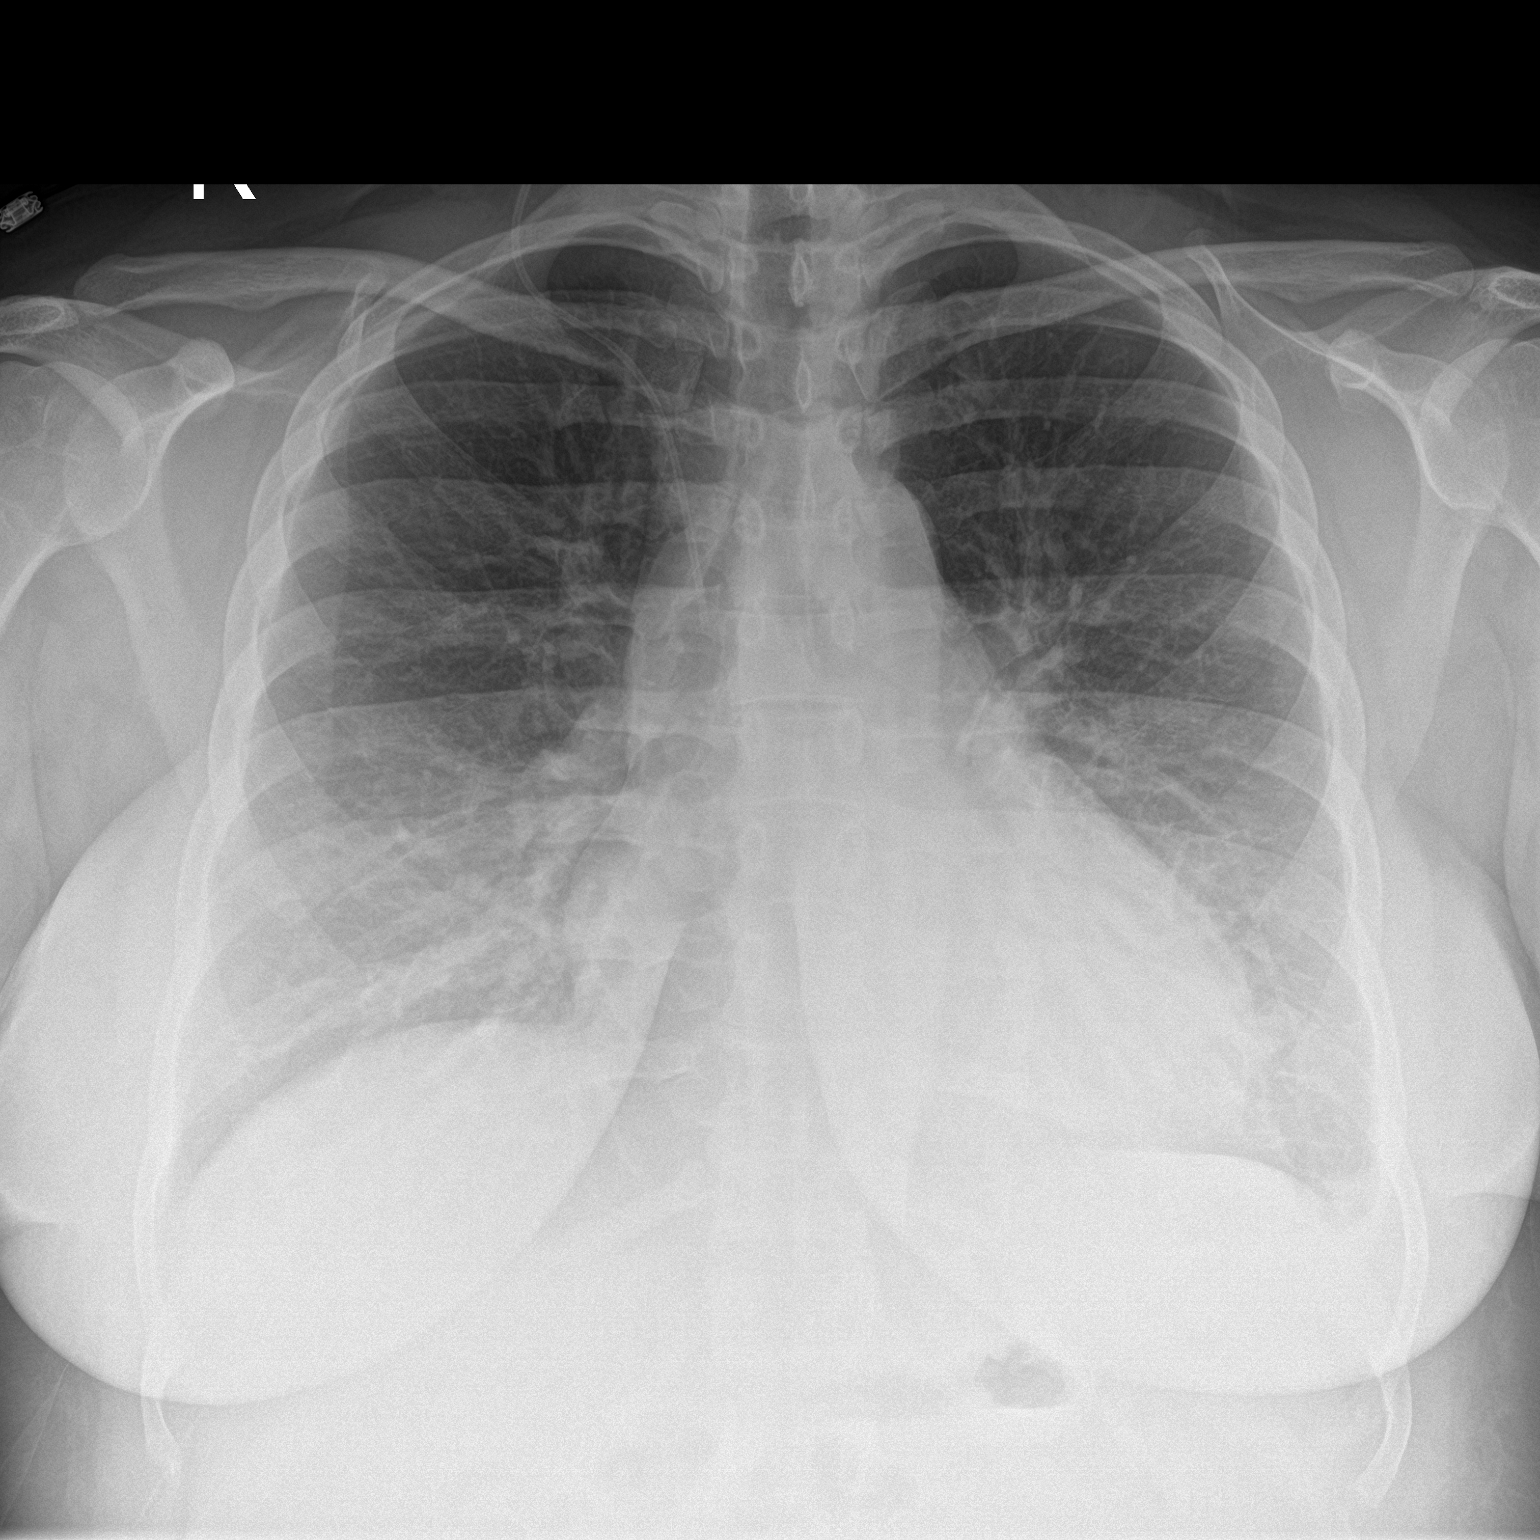

[chest lat]
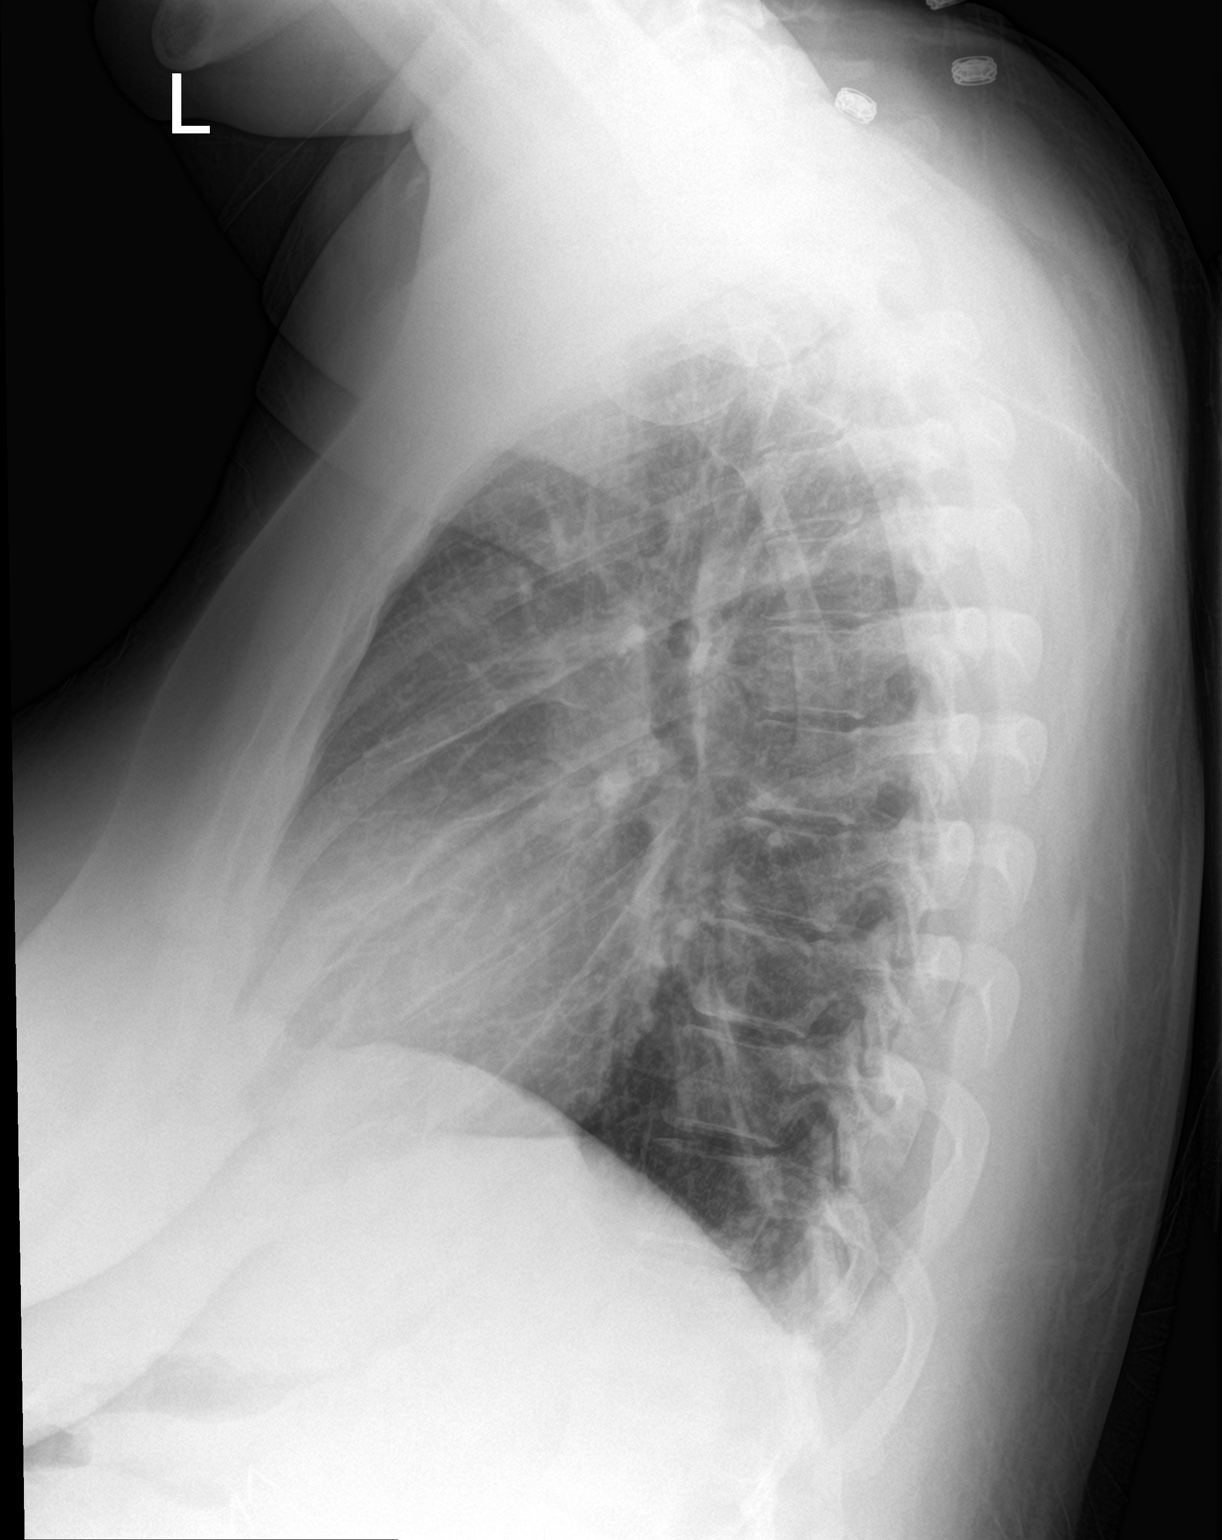

[2 of 2 positions shown; findings below may reference images not displayed]

FINDINGS: The heart size and mediastinal contours are within normal limits.
Subtle heterogeneous airspace opacity of the bilateral lung bases.
The visualized skeletal structures are unremarkable. Shunt catheter
tubing projects over the right neck and chest.
IMPRESSION: Subtle heterogeneous airspace opacity of the bilateral lung bases,
suspicious for infection and particularly [id number] if suspected.

## 2021-03-08 ENCOUNTER — Other Ambulatory Visit: Payer: Self-pay | Admitting: Sports Medicine

## 2021-03-08 MED ORDER — TRIAZOLAM 0.25 MG PO TABS: ORAL_TABLET | ORAL | 0 refills | Status: DC

## 2021-03-12 ENCOUNTER — Other Ambulatory Visit: Payer: Medicaid Other

## 2021-03-14 ENCOUNTER — Encounter: Payer: Self-pay | Admitting: Family Medicine

## 2021-03-14 ENCOUNTER — Ambulatory Visit: Payer: Medicaid Other | Admitting: Family Medicine

## 2021-03-14 ENCOUNTER — Other Ambulatory Visit: Payer: Self-pay

## 2021-03-14 VITALS — BP 135/79 | HR 95 | Ht 68.0 in | Wt 268.0 lb

## 2021-03-14 DIAGNOSIS — Z6841 Body Mass Index (BMI) 40.0 and over, adult: Secondary | ICD-10-CM

## 2021-03-14 DIAGNOSIS — E282 Polycystic ovarian syndrome: Secondary | ICD-10-CM

## 2021-03-14 DIAGNOSIS — Z7689 Persons encountering health services in other specified circumstances: Secondary | ICD-10-CM | POA: Diagnosis not present

## 2021-03-14 DIAGNOSIS — H60332 Swimmer's ear, left ear: Secondary | ICD-10-CM | POA: Diagnosis not present

## 2021-03-14 MED ORDER — METFORMIN HCL 500 MG PO TABS: 500.0000 mg | ORAL_TABLET | Freq: Two times a day (BID) | ORAL | 1 refills | Status: DC

## 2021-03-14 MED ORDER — NEOMYCIN-POLYMYXIN-HC 1 % OT SOLN: 3.0000 [drp] | Freq: Three times a day (TID) | OTIC | 0 refills | Status: DC

## 2021-03-14 NOTE — Progress Notes (Signed)
Acute Office Visit  Subjective:    Patient ID: EDDA OREA, female    DOB: 07-Apr-1991, 30 y.o.   MRN: [id number]  Chief Complaint  Patient presents with   Ear Pain    HPI Patient is in today for Left ear pain.   She says she noticed it over the weekend.  She felt like it was just more fullness.  She does have a pool and gets that it regularly.  She thought maybe the ear just needed to be cleaned out so she tried to wash it out with peroxide.  It felt a little better but then was in the water again and then it started to become full and painful.  He also wanted to discuss weight gain.  Since her last pregnancy she actually got up to about 300 pounds.  Po since post pregnancy though she has not been able to continue to lose weight.  She still struggles with her pseudotumor cerebri and her PCOS.  She wants to know if she has any options she has been trying to eat healthy and she has been trying to stay active and exercise.  She feels hungry within 10 minutes of eating.  She has a history of sleeve gastrectomy  Past Medical History:  Diagnosis Date   Asthma    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   Admitted to [facility] on 08/21/11. Had diagnostic cerebral arteriogram.      Depression    GAD (generalized anxiety disorder) 04/04/2012   Papilledema    Diamox   Pseudotumor cerebri     Past Surgical History:  Procedure Laterality Date   ANGIOPLASTY  2012   ? cerebral artery    CHOLECYSTECTOMY     SHUNT REVISION     4 times, last time 07/24/19   SLEEVE GASTROPLASTY  05/2014   Dr. Evorn Gong    Stent placed  August 2014   Stenosis right transverse sigmoid junction,[facility]   VENTRICULAR ATRIAL SHUNT     VP shunt  11/2013   Done at [facility] History  Problem Relation Age of Onset   Hypertension Father    Heart disease Father    Diabetes Father    Diabetes Mother    Colon cancer Neg Hx    Stomach cancer Neg Hx    Pancreatic cancer Neg Hx    Esophageal cancer Neg Hx    Rectal  cancer Neg Hx     Social History   Socioeconomic History   Marital status: Married    Spouse name: Not on file   Number of children: 0   Years of education: Not on file   Highest education level: Not on file  Occupational History   Occupation: unemployed.    Occupation: Research scientist (physical sciences): [employer]  Tobacco Use   Smoking status: Never   Smokeless tobacco: Never  Vaping Use   Vaping Use: Never used  Substance and Sexual Activity   Alcohol use: Not Currently    Alcohol/week: 0.0 standard drinks    Comment: occ   Drug use: No   Sexual activity: Yes    Partners: Male    Birth control/protection: None  Other Topics Concern   Not on file  Social History Narrative   seperated from husband in nov 2014.  Still legally married.    Social Determinants of Health   Financial Resource Strain: Not on file  Food Insecurity: Not on file  Transportation Needs: Not on file  Physical Activity: Not on file  Stress: Not on file  Social Connections: Not on file  Intimate Partner Violence: Not on file    Outpatient Medications Prior to Visit  Medication Sig Dispense Refill   albuterol (VENTOLIN HFA) 108 (90 Base) MCG/ACT inhaler Inhale 2 puffs into the lungs every 4 (four) hours as needed for wheezing. 8 g 1   aspirin 81 MG chewable tablet Chew by mouth daily.     acetaminophen (TYLENOL) 500 MG tablet Take 1,000 mg by mouth 2 (two) times daily.     predniSONE (DELTASONE) 50 MG tablet One tab PO daily for 5 days. 5 tablet 0   topiramate (TOPAMAX) 25 MG tablet SMARTSIG:2 By Mouth Twice Daily PRN     triazolam (HALCION) 0.25 MG tablet 1-2 tabs PO 2 hours before procedure or imaging.  Do not drive with this medication. 2 tablet 0   No facility-administered medications prior to visit.    Allergies  Allergen Reactions   Latex     Other reaction(s): Other Diagnosed with blood test   Nsaids     Other reaction(s): Other contraindication   Codeine    Hydrocodone-Acetaminophen  Rash    Headaches Morphine, Dilaudid OK   Oxycodone Rash    Headaches   Tape Rash    welps    Review of Systems     Objective:    Physical Exam  BP 135/79   Pulse 95   Ht 5\' 8"  (1.727 m)   Wt 268 lb (121.6 kg)   LMP 02/19/2021   SpO2 98%   BMI 40.75 kg/m  Wt Readings from Last 3 Encounters:  03/14/21 268 lb (121.6 kg)  08/25/20 269 lb (122 kg)  07/20/20 269 lb (122 kg)    Health Maintenance Due  Topic Date Due   Pneumococcal Vaccine 64-58 Years old (1 - PCV) Never done   Hepatitis C Screening  Never done   PAP SMEAR-Modifier  08/13/2020    There are no preventive care reminders to display for this patient.   Lab Results  Component Value Date   TSH 3.720 03/23/2014   Lab Results  Component Value Date   WBC 6.2 04/27/2020   HGB 12.9 04/27/2020   HCT 39.6 04/27/2020   MCV 85.5 04/27/2020   PLT 306 04/27/2020   Lab Results  Component Value Date   NA 136 09/16/2019   K 4.3 09/16/2019   CO2 24 09/16/2019   GLUCOSE 84 09/16/2019   BUN 20 09/16/2019   CREATININE 0.68 09/16/2019   BILITOT 0.3 09/16/2019   ALKPHOS 53 09/16/2019   AST 16 09/16/2019   ALT 15 09/16/2019   PROT 4.2 (L) 09/16/2019   ALBUMIN 1.7 (L) 09/16/2019   CALCIUM 7.0 (L) 09/16/2019   ANIONGAP 5 09/16/2019   Lab Results  Component Value Date   CHOL 191 03/23/2014   Lab Results  Component Value Date   HDL 38 (L) 03/23/2014   Lab Results  Component Value Date   LDLCALC 124 (H) 03/23/2014   Lab Results  Component Value Date   TRIG 147 03/23/2014   Lab Results  Component Value Date   CHOLHDL 5.0 03/23/2014   Lab Results  Component Value Date   HGBA1C 5.3 04/23/2019       Assessment & Plan:   Problem List Items Addressed This Visit       Other   Encounter for weight management    We discussed options.  Her insurance would not cover any of  the newer prescription options.  We discussed phentermine as a possibility but warned about potential side effect such as  increasing pulse and blood pressure so she wanted to hold off we discussed metformin as a useful tool especially since she has a current diagnosis of PCOS I think this could actually be really helpful for her..  Her biggest driver is hunger.  Visit #: 1 Starting Weight: 268 lbs  Current weight: 268 lbs Previous weight: Change in weight: Goal weight: Dietary goals: start Alurate counting, with a smart phone app. Exercise goals: stay active  Medication: metformin 500mg  BID Follow-up and referrals: 6-8 weeks.         Other Visit Diagnoses     Acute swimmer's ear of left side    -  Primary   BMI 40.0-44.9, adult (HCC)       Relevant Medications   metFORMIN (GLUCOPHAGE) 500 MG tablet      Left otitis externa-we will treat with Cortisporin drops.  Call if not covered by insurance.  Also recommend acetic acid drops after swimming.  Recommended not swimming and immersing the ear until the infection is completely cleared.  Meds ordered this encounter  Medications   metFORMIN (GLUCOPHAGE) 500 MG tablet    Sig: Take 1 tablet (500 mg total) by mouth 2 (two) times daily with a meal.    Dispense:  60 tablet    Refill:  1   NEOMYCIN-POLYMYXIN-HYDROCORTISONE (CORTISPORIN) 1 % SOLN OTIC solution    Sig: Place 3 drops into the left ear every 8 (eight) hours. X 7 days.    Dispense:  10 mL    Refill:  0     [name], MD

## 2021-03-14 NOTE — Assessment & Plan Note (Signed)
We discussed options.  Her insurance would not cover any of the newer prescription options.  We discussed phentermine as a possibility but warned about potential side effect such as increasing pulse and blood pressure so she wanted to hold off we discussed metformin as a useful tool especially since she has a current diagnosis of PCOS I think this could actually be really helpful for her..  Her biggest driver is hunger.  Visit #: 1 Starting Weight: 268 lbs  Current weight: 268 lbs Previous weight: Change in weight: Goal weight: Dietary goals: start Alurate counting, with a smart phone app. Exercise goals: stay active  Medication: metformin 500mg  BID Follow-up and referrals: 6-8 weeks.

## 2021-03-27 ENCOUNTER — Other Ambulatory Visit: Payer: Medicaid Other

## 2021-04-10 ENCOUNTER — Ambulatory Visit: Payer: Medicaid Other | Admitting: Sports Medicine

## 2021-05-04 DIAGNOSIS — G932 Benign intracranial hypertension: Secondary | ICD-10-CM | POA: Diagnosis not present

## 2021-05-04 DIAGNOSIS — Z982 Presence of cerebrospinal fluid drainage device: Secondary | ICD-10-CM | POA: Diagnosis not present

## 2021-05-04 DIAGNOSIS — Z4541 Encounter for adjustment and management of cerebrospinal fluid drainage device: Secondary | ICD-10-CM | POA: Diagnosis not present

## 2021-05-09 DIAGNOSIS — R519 Headache, unspecified: Secondary | ICD-10-CM | POA: Diagnosis not present

## 2021-05-09 DIAGNOSIS — Z888 Allergy status to other drugs, medicaments and biological substances status: Secondary | ICD-10-CM | POA: Diagnosis not present

## 2021-05-09 DIAGNOSIS — Z20822 Contact with and (suspected) exposure to covid-19: Secondary | ICD-10-CM | POA: Diagnosis not present

## 2021-05-09 DIAGNOSIS — Z7982 Long term (current) use of aspirin: Secondary | ICD-10-CM | POA: Diagnosis not present

## 2021-05-09 DIAGNOSIS — Z982 Presence of cerebrospinal fluid drainage device: Secondary | ICD-10-CM | POA: Diagnosis not present

## 2021-05-09 DIAGNOSIS — Z885 Allergy status to narcotic agent status: Secondary | ICD-10-CM | POA: Diagnosis not present

## 2021-05-09 DIAGNOSIS — Z886 Allergy status to analgesic agent status: Secondary | ICD-10-CM | POA: Diagnosis not present

## 2021-05-09 DIAGNOSIS — G932 Benign intracranial hypertension: Secondary | ICD-10-CM | POA: Diagnosis not present

## 2021-05-09 DIAGNOSIS — I10 Essential (primary) hypertension: Secondary | ICD-10-CM | POA: Diagnosis not present

## 2021-05-10 ENCOUNTER — Telehealth: Payer: Self-pay | Admitting: *Deleted

## 2021-05-10 NOTE — Telephone Encounter (Signed)
Transition Care Management Follow-up Telephone Call Date of discharge and from where: 05/09/2021 - [facility] How have you been since you were released from the hospital? "Still have a little bit of a headache but feeling better than yesterday" Any questions or concerns? No  Items Reviewed: Did the pt receive and understand the discharge instructions provided? Yes  Medications obtained and verified? Yes  Other? No  Any new allergies since your discharge? No  Dietary orders reviewed? No Do you have support at home? Yes    Functional Questionnaire: (I = Independent and D = Dependent) ADLs: I  Bathing/Dressing- I  Meal Prep- I  Eating- I  Maintaining continence- I  Transferring/Ambulation- I  Managing Meds- I  Follow up appointments reviewed:  PCP Hospital f/u appt confirmed? No   Specialist Hospital f/u appt confirmed? No   Are transportation arrangements needed? No  If their condition worsens, is the pt aware to call PCP or go to the Emergency Dept.? Yes Was the patient provided with contact information for the PCP's office or ED? Yes Was to pt encouraged to call back with questions or concerns? Yes

## 2021-05-23 ENCOUNTER — Encounter: Payer: Self-pay | Admitting: Family Medicine

## 2021-05-24 NOTE — Telephone Encounter (Signed)
Yes, put on tomorrow in acute slot for virutal

## 2021-05-25 ENCOUNTER — Telehealth (INDEPENDENT_AMBULATORY_CARE_PROVIDER_SITE_OTHER): Payer: Medicaid Other | Admitting: Family Medicine

## 2021-05-25 ENCOUNTER — Other Ambulatory Visit: Payer: Self-pay

## 2021-05-25 ENCOUNTER — Encounter: Payer: Self-pay | Admitting: Family Medicine

## 2021-05-25 DIAGNOSIS — L0291 Cutaneous abscess, unspecified: Secondary | ICD-10-CM

## 2021-05-25 DIAGNOSIS — F430 Acute stress reaction: Secondary | ICD-10-CM | POA: Diagnosis not present

## 2021-05-25 MED ORDER — CLONAZEPAM 0.5 MG PO TABS
0.5000 mg | ORAL_TABLET | Freq: Two times a day (BID) | ORAL | 1 refills | Status: DC | PRN
Start: 2021-05-25 — End: 2021-11-26

## 2021-05-25 MED ORDER — SULFAMETHOXAZOLE-TRIMETHOPRIM 800-160 MG PO TABS: 1.0000 | ORAL_TABLET | Freq: Two times a day (BID) | ORAL | 0 refills | Status: DC

## 2021-05-25 NOTE — Progress Notes (Signed)
Virtual Visit via Video Note  I connected with CYNDI DEWOLF on 05/25/21 at 11:30 AM EDT by a video enabled telemedicine application and verified that I am speaking with the correct person using two identifiers.   I discussed the limitations of evaluation and management by telemedicine and the availability of in person appointments. The patient expressed understanding and agreed to proceed.  Patient location: [facility] hospital Provider location: in office  Subjective:    CC: Stress  HPI: Unfortunately her daughter Jodi Mourning became sick with a viral illness.  They took her to the emergency room because they could not get her fever down.  She started to get a little better so they discharged her home.  The next day she started coughing and getting into more respiratory distress so they went back to the emergency department.  They gave her breathing treatments and then send her home.  She got worse again and went back in that time they kept her and put her on high flow oxygen unfortunately she declined and they ended up intubating her and then transferring her to [facility] which is where she is at right now.  Mom has been under a lot of stress and she would like something to help her with her nerves and anxiousness.  She really does not want to go back on Lexapro though she did well with it previously she really just wants something that might help her short-term.  They are hoping that they will be able to take her daughter off the ventilator in the next couple of days.  She also has a abscess on her left hip area just where her waistband of her pants are its been there for several days it is red and warm she said last night in the shower it actually drained some pus.  She sent a picture on my chart.  It is a couple of inches wide.    Past medical history, Surgical history, Family history not pertinant except as noted below, Social history, Allergies, and medications have been entered into the  medical record, reviewed, and corrections made.    Objective:    General: Speaking clearly in complete sentences without any shortness of breath.  Alert and oriented x3.  Normal judgment. No apparent acute distress.    Impression and Recommendations:    No problem-specific Assessment & Plan notes found for this encounter.  Acute stress-sent prescription for sparing use of clonazepam.  We did warn about potential for sedation.  Abscess - will tx with bactrim. Continue warm compresses to help it drain.  Call or notify us through MyChart if not improving  No orders of the defined types were placed in this encounter.   Meds ordered this encounter  Medications   clonazePAM (KLONOPIN) 0.5 MG tablet    Sig: Take 1 tablet (0.5 mg total) by mouth 2 (two) times daily as needed for anxiety.    Dispense:  20 tablet    Refill:  1   sulfamethoxazole-trimethoprim (BACTRIM DS) 800-160 MG tablet    Sig: Take 1 tablet by mouth 2 (two) times daily.    Dispense:  10 tablet    Refill:  0     I discussed the assessment and treatment plan with the patient. The patient was provided an opportunity to ask questions and all were answered. The patient agreed with the plan and demonstrated an understanding of the instructions.   The patient was advised to call back or seek an in-person evaluation if  the symptoms worsen or if the condition fails to improve as anticipated.   [name], MD

## 2021-06-21 ENCOUNTER — Encounter: Payer: Self-pay | Admitting: Family Medicine

## 2021-06-22 ENCOUNTER — Encounter: Payer: Self-pay | Admitting: Family Medicine

## 2021-06-22 ENCOUNTER — Telehealth (INDEPENDENT_AMBULATORY_CARE_PROVIDER_SITE_OTHER): Payer: Medicaid Other | Admitting: Family Medicine

## 2021-06-22 DIAGNOSIS — J069 Acute upper respiratory infection, unspecified: Secondary | ICD-10-CM

## 2021-06-22 NOTE — Progress Notes (Signed)
She reports experiencing sore throat x1 day.  Sinus drainage 2 days ago.  Denies any f/s/c/n/v/d.  She has had a cough with clear drainage.   She has been taking Mucinex Q12 hours, Tylenol. She hasn't taken a COVID test but does have these if she needs to take one, no sick contacts.

## 2021-06-22 NOTE — Progress Notes (Signed)
Virtual Visit via Video Note  I connected with Maria Payne on 06/22/21 at  1:00 PM EDT by a video enabled telemedicine application and verified that I am speaking with the correct person using two identifiers.   I discussed the limitations of evaluation and management by telemedicine and the availability of in person appointments. The patient expressed understanding and agreed to proceed.  Patient location: at home Provider location: in office  Subjective:    CC: Sore throat  HPI: ST with some post nasal drip.for a couple day. No fever or loose stools. No cough.  No nasal congestion.  Cervical LN is swollen. Sore to swallow. Taking mucinex and Zyrtec.    Past medical history, Surgical history, Family history not pertinant except as noted below, Social history, Allergies, and medications have been entered into the medical record, reviewed, and corrections made.    Objective:    General: Speaking clearly in complete sentences without any shortness of breath.  Alert and oriented x3.  Normal judgment. No apparent acute distress.    Impression and Recommendations:    No problem-specific Assessment & Plan notes found for this encounter.  Upper respiratory infection versus seasonal allergies.  Right now she does not have any significant symptoms such as cough or fever but she does have some drainage and mild sore throat.  Okay to continue with the Mucinex and Zyrtec this should help if it is allergies.-did encourage her to check for COVID today she does have a home kit if negative then recommend testing again 1 more time tomorrow especially before being around other family numbers this weekend.  Discussed that even if she is negative for COVID this still could be a viral illness and to take those precautions as needed.  If she develops any new or worsening symptoms then please give [place] a call back.  No orders of the defined types were placed in this encounter.   No orders of the defined  types were placed in this encounter.    I discussed the assessment and treatment plan with the patient. The patient was provided an opportunity to ask questions and all were answered. The patient agreed with the plan and demonstrated an understanding of the instructions.   The patient was advised to call back or seek an in-person evaluation if the symptoms worsen or if the condition fails to improve as anticipated.   [name], MD

## 2021-06-30 MED ORDER — AMOXICILLIN 875 MG PO TABS: 875.0000 mg | ORAL_TABLET | Freq: Two times a day (BID) | ORAL | 0 refills | Status: AC

## 2021-06-30 NOTE — Telephone Encounter (Signed)
Meds ordered this encounter  Medications   amoxicillin (AMOXIL) 875 MG tablet    Sig: Take 1 tablet (875 mg total) by mouth 2 (two) times daily for 10 days.    Dispense:  14 tablet    Refill:  0

## 2021-08-22 ENCOUNTER — Telehealth: Payer: Medicaid Other | Admitting: Emergency Medicine

## 2021-08-22 DIAGNOSIS — B9689 Other specified bacterial agents as the cause of diseases classified elsewhere: Secondary | ICD-10-CM | POA: Diagnosis not present

## 2021-08-22 DIAGNOSIS — J019 Acute sinusitis, unspecified: Secondary | ICD-10-CM | POA: Diagnosis not present

## 2021-08-22 MED ORDER — AMOXICILLIN-POT CLAVULANATE 875-125 MG PO TABS: 1.0000 | ORAL_TABLET | Freq: Two times a day (BID) | ORAL | 0 refills | Status: DC

## 2021-08-22 MED ORDER — FLUCONAZOLE 150 MG PO TABS: 150.0000 mg | ORAL_TABLET | Freq: Once | ORAL | 0 refills | Status: AC

## 2021-08-22 NOTE — Progress Notes (Addendum)
E-Visit for Sinus Problems  We are sorry that you are not feeling well.  Here is how we plan to help!  Based on what you have shared with me it looks like you have sinusitis.  Sinusitis is inflammation and infection in the sinus cavities of the head.  Based on your presentation I believe you most likely have Acute Bacterial Sinusitis.  This is an infection caused by bacteria and is treated with antibiotics. I have prescribed Augmentin 875mg /125mg  one tablet twice daily with food, for 7 days. You may use  plain Mucinex. Saline nasal spray help and can safely be used as often as needed for congestion.  If you develop worsening sinus pain, fever or notice severe headache and vision changes, or if symptoms are not better after completion of antibiotic, please schedule an appointment with a health care provider.    Sinus infections are not as easily transmitted as other respiratory infection, however we still recommend that you avoid close contact with loved ones, especially the very young and elderly.  Remember to wash your hands thoroughly throughout the day as this is the number one way to prevent the spread of infection!  Home Care: Only take medications as instructed by your medical team. Complete the entire course of an antibiotic. Do not take these medications with alcohol. A steam or ultrasonic humidifier can help congestion.  You can place a towel over your head and breathe in the steam from hot water coming from a faucet. Avoid close contacts especially the very young and the elderly. Cover your mouth when you cough or sneeze. Always remember to wash your hands.  Get Help Right Away If: You develop worsening fever or sinus pain. You develop a severe head ache or visual changes. Your symptoms persist after you have completed your treatment plan.  Make sure you Understand these instructions. Will watch your condition. Will get help right away if you are not doing well or get  worse.  Thank you for choosing an e-visit.  Your e-visit answers were reviewed by a board certified advanced clinical practitioner to complete your personal care plan. Depending upon the condition, your plan could have included both over the counter or prescription medications.  Please review your pharmacy choice. Make sure the pharmacy is open so you can pick up prescription now. If there is a problem, you may contact your provider through [employer] and have the prescription routed to another pharmacy.  Your safety is important to [place]. If you have drug allergies check your prescription carefully.   For the next 24 hours you can use MyChart to ask questions about today's visit, request a non-urgent call back, or ask for a work or school excuse. You will get an email in the next two days asking about your experience. I hope that your e-visit has been valuable and will speed your recovery.  I have spent 5 minutes in review of e-visit questionnaire, review and updating patient chart, medical decision making and response to patient.   Willeen Cass, PhD, FNP-BC

## 2021-11-09 DIAGNOSIS — H5213 Myopia, bilateral: Secondary | ICD-10-CM | POA: Diagnosis not present

## 2021-11-26 ENCOUNTER — Encounter: Payer: Self-pay | Admitting: Emergency Medicine

## 2021-11-26 ENCOUNTER — Emergency Department
Admission: EM | Admit: 2021-11-26 | Discharge: 2021-11-26 | Disposition: A | Discharge status: Home / Self Care | Payer: Medicaid Other | Source: Home / Self Care | Attending: Family Medicine | Admitting: Family Medicine

## 2021-11-26 DIAGNOSIS — J0100 Acute maxillary sinusitis, unspecified: Secondary | ICD-10-CM | POA: Diagnosis not present

## 2021-11-26 MED ORDER — AMOXICILLIN-POT CLAVULANATE 875-125 MG PO TABS: 1.0000 | ORAL_TABLET | Freq: Two times a day (BID) | ORAL | 0 refills | Status: DC

## 2021-11-26 MED ORDER — FLUCONAZOLE 150 MG PO TABS: 150.0000 mg | ORAL_TABLET | Freq: Every day | ORAL | 0 refills | Status: DC

## 2021-11-26 MED ORDER — PREDNISONE 20 MG PO TABS: 20.0000 mg | ORAL_TABLET | Freq: Two times a day (BID) | ORAL | 0 refills | Status: DC

## 2021-11-26 NOTE — ED Triage Notes (Signed)
Sinus drainage  3 days ago - none  now  ?Facial pain on left - pt also has a dental pain from a cavity or a hole on left  ?Left sided neck pain  ?Unable to locate a dentist w/ current insurance  ?

## 2021-11-26 NOTE — ED Provider Notes (Signed)
?[place] ? ? ? ?CSN: [id number] ?Arrival date & time: 11/26/21  1324 ? ? ?  ? ?History   ?Chief Complaint ?Chief Complaint  ?Patient presents with  ? Facial Pain  ? ? ?HPI ?Maria Payne is a 31 y.o. female.  ? ?HPI ? ?Patient is here for left-sided facial pain.  She states that the pressure and pain feels like it is in her left cheek.  She also has some pain in her teeth.  Pain when she chews.  She is having sinus drainage on the left side.  When she tries to use a saline rinse she gets blood and has pain with saline in her left nostril.  She also has pain radiating to her left neck.  She thinks may be from a swollen gland.  She does have some allergies but not usually localized like this.  No numbness or weakness. ? ?Past Medical History:  ?Diagnosis Date  ? Asthma   ? 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 Admitted to [facility] on 08/21/11. Had diagnostic cerebral arteriogram.     ? Depression   ? GAD (generalized anxiety disorder) 04/04/2012  ? Papilledema   ? Diamox  ? Pseudotumor cerebri   ? ? ?Patient Active Problem List  ? Diagnosis Date Noted  ? Encounter for weight management 03/14/2021  ? PCOS (polycystic ovarian syndrome) 03/14/2021  ? Radiculitis of left cervical region 02/22/2021  ? Diarrhea 04/25/2020  ? History of preterm delivery 10/28/2019  ? History of severe pre-eclampsia 10/27/2019  ? 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 Severe persistent asthma with exacerbation 09/09/2019  ? Hypertension 08/14/2019  ? BMI 38.0-38.9,adult 04/23/2019  ? Shunt malfunction 04/20/2017  ? S/P ventriculoperitoneal shunt 11/30/2016  ? Pain of upper abdomen 11/21/2015  ? Psychic factors associated with diseases classified elsewhere 12/31/2013  ? Anxiety state 12/31/2013  ? MDD (major depressive disorder) 04/04/2012  ? GAD (generalized anxiety disorder) 04/04/2012  ? 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 IIH (idiopathic intracranial hypertension) 09/10/2011  ? Papilledema  associated with increased intracranial pressure 08/20/2011  ? DERMATITIS, PERIORAL 05/17/2010  ? ? ?Past Surgical History:  ?Procedure Laterality Date  ? ANGIOPLASTY  2012  ? ? cerebral artery   ? CHOLECYSTECTOMY    ? SHUNT REVISION    ? 4 times, last time 07/24/19  ? SLEEVE GASTROPLASTY  05/2014  ? Dr. Evorn Gong   ? Stent placed  August 2014  ? Stenosis right transverse sigmoid junction,[facility]  ? VENTRICULAR ATRIAL SHUNT    ? VP shunt  11/2013  ? Done at [place]  ? ? ?OB History   ? ? Gravida  ?1  ? Para  ?1  ? Term  ?0  ? Preterm  ?1  ? AB  ?0  ? Living  ?1  ?  ? ? SAB  ?0  ? IAB  ?0  ? Ectopic  ?0  ? Multiple  ?0  ? Live Births  ?1  ?   ?  ?  ? ? ? ?Home Medications   ? ?Prior to Admission medications   ?Medication Sig Start Date End Date Taking? Authorizing Provider  ?amoxicillin-clavulanate (AUGMENTIN) 875-125 MG tablet Take 1 tablet by mouth every 12 (twelve) hours. 11/26/21  Yes Raylene Everts, MD  ?fluconazole (DIFLUCAN) 150 MG tablet Take 1 tablet (150 mg total) by mouth daily. Repeat in 1 week if needed 11/26/21  Yes Raylene Everts, MD  ?predniSONE (DELTASONE) 20 MG tablet Take 1  tablet (20 mg total) by mouth 2 (two) times daily with a meal. 11/26/21  Yes Raylene Everts, MD  ?albuterol (VENTOLIN HFA) 108 (90 Base) MCG/ACT inhaler Inhale 2 puffs into the lungs every 4 (four) hours as needed for wheezing. 09/26/20   Donella Stade, PA-C  ?aspirin 81 MG chewable tablet Chew by mouth daily.    [provider]  ? ? ?Family History ?Family History  ?Problem Relation Age of Onset  ? Hypertension Father   ? Heart disease Father   ? Diabetes Father   ? Diabetes Mother   ? Colon cancer Neg Hx   ? Stomach cancer Neg Hx   ? Pancreatic cancer Neg Hx   ? Esophageal cancer Neg Hx   ? Rectal cancer Neg Hx   ? ? ?Social History ?Social History  ? ?Tobacco Use  ? Smoking status: Never  ? Smokeless tobacco: Never  ?Vaping Use  ? Vaping Use: Never used  ?Substance Use Topics  ? Alcohol use: Not Currently  ?   Alcohol/week: 0.0 standard drinks  ?  Comment: occ  ? Drug use: No  ? ? ? ?Allergies   ?Latex, Nsaids, Codeine, Hydrocodone-acetaminophen, Oxycodone, and Tape ? ? ?Review of Systems ?Review of Systems ? ?See HPI ?Physical Exam ?Triage Vital Signs ?ED Triage Vitals  ?Enc Vitals Group  ?   BP 11/26/21 1341 137/90  ?   Pulse Rate 11/26/21 1341 90  ?   Resp 11/26/21 1341 17  ?   Temp 11/26/21 1341 98.4 ?F (36.9 ?C)  ?   Temp Source 11/26/21 1341 Oral  ?   SpO2 11/26/21 1341 96 %  ?   Weight 11/26/21 1344 250 lb (113.4 kg)  ?   Height 11/26/21 1344 '5\' 8"'$  (1.727 m)  ?   Head Circumference --   ?   Peak Flow --   ?   Pain Score 11/26/21 1343 5  ?   Pain Loc --   ?   Pain Edu? --   ?   Excl. in [place]? --   ? ?No data found. ? ?Updated Vital Signs ?BP 137/90 (BP Location: Right Arm)   Pulse 90   Temp 98.4 ?F (36.9 ?C) (Oral)   Resp 17   Ht '5\' 8"'$  (1.727 m)   Wt 113.4 kg   LMP 11/05/2021 (Approximate)   SpO2 96%   BMI 38.01 kg/m?  ?   ? ?Physical Exam ?Constitutional:   ?   General: She is not in acute distress. ?   Appearance: She is well-developed. She is obese.  ?HENT:  ?   Head: Normocephalic and atraumatic.  ?   Right Ear: Tympanic membrane and ear canal normal.  ?   Left Ear: Tympanic membrane and ear canal normal.  ?   Nose: Congestion present. No rhinorrhea.  ?   Mouth/Throat:  ?   Mouth: Mucous membranes are moist.  ?   Pharynx: No posterior oropharyngeal erythema.  ?   Comments: Tenderness directly over the left maxillary sinus.  Oral exam reveals no erythema of gum.  TMJ and TM on the left side are normal as well. ?Eyes:  ?   Conjunctiva/sclera: Conjunctivae normal.  ?   Pupils: Pupils are equal, round, and reactive to light.  ?Cardiovascular:  ?   Rate and Rhythm: Normal rate.  ?   Heart sounds: Normal heart sounds.  ?Pulmonary:  ?   Effort: Pulmonary effort is normal. No respiratory distress.  ?  Breath sounds: Normal breath sounds.  ?Abdominal:  ?   General: There is no distension.  ?   Palpations: Abdomen  is soft.  ?Musculoskeletal:     ?   General: Normal range of motion.  ?   Cervical back: Normal range of motion.  ?Lymphadenopathy:  ?   Cervical: Cervical adenopathy present.  ?Skin: ?   General: Skin is warm and dry.  ?Neurological:  ?   Mental Status: She is alert.  ? ? ? ?UC Treatments / Results  ?Labs ?(all labs ordered are listed, but only abnormal results are displayed) ?Labs Reviewed - No data to display ? ?EKG ? ? ?Radiology ?No results found. ? ?Procedures ?Procedures (including critical care time) ? ?Medications Ordered in UC ?Medications - No data to display ? ?Initial Impression / Assessment and Plan / UC Course  ?I have reviewed the triage vital signs and the nursing notes. ? ?Pertinent labs & imaging results that were available during my care of the patient were reviewed by me and considered in my medical decision making (see chart for details). ? ?  ? ?Concern for bacterial sinusitis given the localized nature of her discomfort. ?We also discussed possible dental pain and follow-up with dentist ?Final Clinical Impressions(s) / UC Diagnoses  ? ?Final diagnoses:  ?Acute non-recurrent maxillary sinusitis  ? ? ? ?Discharge Instructions   ? ?  ?Drink lots of water ?Take the Augmentin 2 times a day for a week ?Take Diflucan if needed for yeast infection ?Take also the prednisone 2 times a day for a week.  This reduces swelling and inflammation in your sinus passages. ?Run a humidifier in your bedroom if you have 1 ?See your primary care if not improving by next week ? ? ?ED Prescriptions   ? ? Medication Sig Dispense Auth. Provider  ? amoxicillin-clavulanate (AUGMENTIN) 875-125 MG tablet Take 1 tablet by mouth every 12 (twelve) hours. 14 tablet Raylene Everts, MD  ? predniSONE (DELTASONE) 20 MG tablet Take 1 tablet (20 mg total) by mouth 2 (two) times daily with a meal. 10 tablet Raylene Everts, MD  ? fluconazole (DIFLUCAN) 150 MG tablet Take 1 tablet (150 mg total) by mouth daily. Repeat in 1 week  if needed 2 tablet Raylene Everts, MD  ? ?  ? ?PDMP not reviewed this encounter. ?  ?Raylene Everts, MD ?11/26/21 1627 ? ?

## 2021-11-26 NOTE — Discharge Instructions (Signed)
Drink lots of water ?Take the Augmentin 2 times a day for a week ?Take Diflucan if needed for yeast infection ?Take also the prednisone 2 times a day for a week.  This reduces swelling and inflammation in your sinus passages. ?Run a humidifier in your bedroom if you have 1 ?See your primary care if not improving by next week ?

## 2021-11-28 ENCOUNTER — Encounter: Payer: Medicaid Other | Admitting: Family Medicine

## 2021-11-29 ENCOUNTER — Encounter: Payer: Self-pay | Admitting: Family Medicine

## 2021-11-29 ENCOUNTER — Ambulatory Visit (INDEPENDENT_AMBULATORY_CARE_PROVIDER_SITE_OTHER): Payer: Medicaid Other | Admitting: Family Medicine

## 2021-11-29 ENCOUNTER — Other Ambulatory Visit (HOSPITAL_COMMUNITY)
Admission: RE | Admit: 2021-11-29 | Discharge: 2021-11-29 | Disposition: A | Discharge status: Home / Self Care | Payer: Medicaid Other | Source: Ambulatory Visit | Attending: Family Medicine | Admitting: Family Medicine

## 2021-11-29 ENCOUNTER — Other Ambulatory Visit: Payer: Self-pay

## 2021-11-29 VITALS — BP 132/86 | HR 88 | Resp 18 | Ht 68.0 in | Wt 267.0 lb

## 2021-11-29 DIAGNOSIS — Z124 Encounter for screening for malignant neoplasm of cervix: Secondary | ICD-10-CM | POA: Diagnosis not present

## 2021-11-29 DIAGNOSIS — Z0000 Encounter for general adult medical examination without abnormal findings: Secondary | ICD-10-CM

## 2021-11-29 NOTE — Progress Notes (Addendum)
Subjective:  ?  ? Maria Payne is a 31 y.o. female and is here for a comprehensive physical exam. The patient reports no problems.  She is doing well overall she works from home but also helps keep her daughter at home as well.  Her husband travels a lot for work.  She is struggling a little bit with her weight she says she has been trying to eat healthy and has been exercising but really has not been able to lose a lot of weight.  She does have a family history significant for thyroid disease in her sister. ?Just bought a new house. Daughter dx with Asthma.  ? ?Has been having heavy periods. Will check CBC ? ?Social History  ? ?Socioeconomic History  ? Marital status: Married  ?  Spouse name: Not on file  ? Number of children: 1  ? Years of education: Not on file  ? Highest education level: Not on file  ?Occupational History  ? Occupation: Logistics  ?Tobacco Use  ? Smoking status: Never  ? Smokeless tobacco: Never  ?Vaping Use  ? Vaping Use: Never used  ?Substance and Sexual Activity  ? Alcohol use: Not Currently  ? Drug use: No  ? Sexual activity: Yes  ?  Partners: Male  ?  Birth control/protection: None  ?Other Topics Concern  ? Not on file  ?Social History Narrative  ? No exercise. No Caffeine.   ? ?Social Determinants of Health  ? ?Financial Resource Strain: Not on file  ?Food Insecurity: Not on file  ?Transportation Needs: Not on file  ?Physical Activity: Not on file  ?Stress: Not on file  ?Social Connections: Not on file  ?Intimate Partner Violence: Not on file  ? ?Health Maintenance  ?Topic Date Due  ? Hepatitis C Screening  Never done  ? PAP SMEAR-Modifier  08/13/2020  ? INFLUENZA VACCINE  12/08/2021 (Originally 04/10/2021)  ? COVID-19 Vaccine (1) 07/09/2023 (Originally [birth year]1990)  ? TETANUS/TDAP  03/23/2024  ? HIV Screening  Completed  ? Pneumococcal Vaccine 59-41 Years old  Aged Out  ? HPV VACCINES  Aged Out  ? ? ?The following portions of the patient's history were reviewed and updated as appropriate:  allergies, current medications, past family history, past medical history, past social history, past surgical history, and problem list. ? ?Review of Systems ?Pertinent items are noted in HPI.  ? ?Objective:  ? ? BP 132/86   Pulse 88   Resp 18   Ht '5\' 8"'$  (1.727 m)   Wt 267 lb (121.1 kg)   LMP 11/07/2021 (Approximate)   SpO2 98%   BMI 40.60 kg/m?  ?General appearance: alert, cooperative, and appears stated age ?Head: Normocephalic, without obvious abnormality, atraumatic ?Eyes:  conj clear, EOMI, PEERLA ?Ears: normal TM's and external ear canals both ears ?Nose: Nares normal. Septum midline. Mucosa normal. No drainage or sinus tenderness. ?Throat: lips, mucosa, and tongue normal; teeth and gums normal ?Neck: no adenopathy, no carotid bruit, no JVD, supple, symmetrical, trachea midline, and thyroid not enlarged, symmetric, no tenderness/mass/nodules ?Back: symmetric, no curvature. ROM normal. No CVA tenderness. ?Lungs: clear to auscultation bilaterally ?Breasts: normal appearance, no masses or tenderness ?Heart: regular rate and rhythm, S1, S2 normal, no murmur, click, rub or gallop ?Abdomen: soft, non-tender; bowel sounds normal; no masses,  no organomegaly ?Pelvic: cervix normal in appearance, external genitalia normal, no adnexal masses or tenderness, no cervical motion tenderness, rectovaginal septum normal, uterus normal size, shape, and consistency, and vagina normal without discharge ?Extremities: extremities  normal, atraumatic, no cyanosis or edema ?Pulses: 2+ and symmetric ?Skin: Skin color, texture, turgor normal. No rashes or lesions ?Lymph nodes: Cervical, supraclavicular, and axillary nodes normal. ?Neurologic: Grossly normal  ?  ?Assessment:  ? ? Healthy female exam.    ?  ?Plan:  ? ?  ?See After Visit Summary for Counseling Recommendations  ?Keep up a regular exercise program and make sure you are eating a healthy diet ?Try to eat 4 servings of dairy a day, or if you are lactose intolerant take a  calcium with vitamin D daily.  ?Your vaccines are up to date.  ?Will call with pap results.  ?Labs ordered today.  ?Will do wet prep for vaginal odor.  ? ?

## 2021-11-30 LAB — LIPID PANEL W/REFLEX DIRECT LDL
Cholesterol: 194 mg/dL (ref <200)
HDL: 59 mg/dL (ref >=50)
LDL Cholesterol (Calc): 109 mg/dL (calc) — ABNORMAL HIGH
Non-HDL Cholesterol (Calc): 135 mg/dL (calc) — ABNORMAL HIGH (ref <130)
Total CHOL/HDL Ratio: 3.3 (calc) (ref <5.0)
Triglycerides: 148 mg/dL (ref <150)

## 2021-11-30 LAB — COMPLETE METABOLIC PANEL WITH GFR
AG Ratio: 1.7 (calc) (ref 1.0–2.5)
ALT: 13 U/L (ref 6–29)
AST: 10 U/L (ref 10–30)
Albumin: 4.5 g/dL (ref 3.6–5.1)
Alkaline phosphatase (APISO): 49 U/L (ref 31–125)
BUN: 15 mg/dL (ref 7–25)
CO2: 26 mmol/L (ref 20–32)
Calcium: 9.6 mg/dL (ref 8.6–10.2)
Chloride: 103 mmol/L (ref 98–110)
Creat: 0.62 mg/dL (ref 0.50–0.97)
Globulin: 2.6 g/dL (calc) (ref 1.9–3.7)
Glucose, Bld: 82 mg/dL (ref 65–99)
Potassium: 4.1 mmol/L (ref 3.5–5.3)
Sodium: 140 mmol/L (ref 135–146)
Total Bilirubin: 0.3 mg/dL (ref 0.2–1.2)
Total Protein: 7.1 g/dL (ref 6.1–8.1)
eGFR: 122 mL/min/{1.73_m2} (ref >=60)

## 2021-11-30 LAB — CBC
HCT: 40.2 % (ref 35.0–45.0)
Hemoglobin: 13 g/dL (ref 11.7–15.5)
MCH: 27.6 pg (ref 27.0–33.0)
MCHC: 32.3 g/dL (ref 32.0–36.0)
MCV: 85.4 fL (ref 80.0–100.0)
MPV: 9.7 fL (ref 7.5–12.5)
Platelets: 325 10*3/uL (ref 140–400)
RBC: 4.71 10*6/uL (ref 3.80–5.10)
RDW: 13.3 % (ref 11.0–15.0)
WBC: 9.1 10*3/uL (ref 3.8–10.8)

## 2021-11-30 LAB — CYTOLOGY - PAP
Comment: NEGATIVE
Diagnosis: NEGATIVE
High risk HPV: NEGATIVE

## 2021-11-30 LAB — TSH: TSH: 1.63 mIU/L

## 2021-12-01 NOTE — Progress Notes (Signed)
Hi Maria Payne, LDL cholesterol is just slightly elevated above goal.  But triglycerides look good.  Just continue to work on healthy diet and regular exercise to bring that down.  Pap smear is normal and you are also negative for HPV so recommend repeat Pap smear in 5 years.  Your metabolic panel looks great.  Blood count is also normal no sign of anemia.  Thyroid looks perfect at 1.6.

## 2021-12-19 ENCOUNTER — Telehealth: Payer: Medicaid Other | Admitting: Physician Assistant

## 2021-12-19 DIAGNOSIS — J019 Acute sinusitis, unspecified: Secondary | ICD-10-CM

## 2021-12-19 DIAGNOSIS — B9689 Other specified bacterial agents as the cause of diseases classified elsewhere: Secondary | ICD-10-CM | POA: Diagnosis not present

## 2021-12-19 MED ORDER — FLUCONAZOLE 150 MG PO TABS: 150.0000 mg | ORAL_TABLET | Freq: Once | ORAL | 0 refills | Status: AC

## 2021-12-19 MED ORDER — DOXYCYCLINE HYCLATE 100 MG PO TABS: 100.0000 mg | ORAL_TABLET | Freq: Two times a day (BID) | ORAL | 0 refills | Status: DC

## 2021-12-19 NOTE — Addendum Note (Signed)
Addended by: Brunetta Jeans on: 12/19/2021 08:21 AM ? ? Modules accepted: Orders ? ?

## 2021-12-19 NOTE — Progress Notes (Signed)

## 2021-12-19 NOTE — Progress Notes (Signed)
I have spent 5 minutes in review of e-visit questionnaire, review and updating patient chart, medical decision making and response to patient.   Prisca Gearing Cody Birttany Dechellis, PA-C    

## 2022-01-05 ENCOUNTER — Encounter: Payer: Self-pay | Admitting: Family Medicine

## 2022-01-05 ENCOUNTER — Ambulatory Visit: Payer: Medicaid Other | Admitting: Family Medicine

## 2022-01-05 VITALS — BP 125/86 | HR 90 | Temp 98.3°F | Ht 68.0 in | Wt 266.0 lb

## 2022-01-05 DIAGNOSIS — J020 Streptococcal pharyngitis: Secondary | ICD-10-CM | POA: Insufficient documentation

## 2022-01-05 DIAGNOSIS — J029 Acute pharyngitis, unspecified: Secondary | ICD-10-CM | POA: Diagnosis not present

## 2022-01-05 LAB — POCT RAPID STREP A (OFFICE): Rapid Strep A Screen: POSITIVE — AB

## 2022-01-05 MED ORDER — FLUCONAZOLE 150 MG PO TABS: 150.0000 mg | ORAL_TABLET | Freq: Once | ORAL | 0 refills | Status: AC

## 2022-01-05 MED ORDER — AMOXICILLIN 500 MG PO CAPS: 500.0000 mg | ORAL_CAPSULE | Freq: Two times a day (BID) | ORAL | 0 refills | Status: AC

## 2022-01-05 NOTE — Patient Instructions (Signed)
Strep Throat, Adult Strep throat is an infection of the throat. It is caused by germs (bacteria). Strep throat is common during the cold months of the year. It mostly affects children who are 5-32 years old. However, people of all ages can get it at any time of the year. This infection spreads from person to person through coughing, sneezing, or having close contact. What are the causes? This condition is caused by the Streptococcus pyogenes germ. What increases the risk? You care for young children. Children are more likely to get strep throat and may spread it to others. You go to crowded places. Germs can spread easily in such places. You kiss or touch someone who has strep throat. What are the signs or symptoms? Fever or chills. Redness, swelling, or pain in the tonsils or throat. Pain or trouble when swallowing. White or yellow spots on the tonsils or throat. Tender glands in the neck and under the jaw. Bad breath. Red rash all over the body. This is rare. How is this treated? Medicines that kill germs (antibiotics). Medicines that treat pain or fever. These include: Ibuprofen or acetaminophen. Aspirin, only for people who are over the age of 18. Cough drops. Throat sprays. Follow these instructions at home: Medicines  Take over-the-counter and prescription medicines only as told by your doctor. Take your antibiotic medicine as told by your doctor. Do not stop taking the antibiotic even if you start to feel better. Eating and drinking  If you have trouble swallowing, eat soft foods until your throat feels better. Drink enough fluid to keep your pee (urine) pale yellow. To help with pain, you may have: Warm fluids, such as soup and tea. Cold fluids, such as frozen desserts or popsicles. General instructions Rinse your mouth (gargle) with a salt-water mixture 3-4 times a day or as needed. To make a salt-water mixture, dissolve -1 tsp (3-6 g) of salt in 1 cup (237 mL) of warm  water. Rest as much as you can. Stay home from work or school until you have been taking antibiotics for 24 hours. Do not smoke or use any products that contain nicotine or tobacco. If you need help quitting, ask your doctor. Keep all follow-up visits. How is this prevented?  Do not share food, drinking cups, or personal items. They can cause the germs to spread. Wash your hands well with soap and water. Make sure that all people in your house wash their hands well. Have family members tested if they have a fever or a sore throat. They may need an antibiotic if they have strep throat. Contact a doctor if: You have swelling in your neck that keeps getting bigger. You get a rash, cough, or earache. You cough up a thick fluid that is green, yellow-brown, or bloody. You have pain that does not get better with medicine. Your symptoms get worse instead of getting better. You have a fever. Get help right away if: You vomit. You have a very bad headache. Your neck hurts or feels stiff. You have chest pain or are short of breath. You have drooling, very bad throat pain, or changes in your voice. Your neck is swollen, or the skin gets red and tender. Your mouth is dry, or you are peeing less than normal. You keep feeling more tired or have trouble waking up. Your joints are red or painful. These symptoms may be an emergency. Do not wait to see if the symptoms will go away. Get help right away. Call   your local emergency services (911 in the [place] Summary Strep throat is an infection of the throat. It is caused by germs (bacteria). This infection can spread from person to person through coughing, sneezing, or having close contact. Take your medicines, including antibiotics, as told by your doctor. Do not stop taking the antibiotic even if you start to feel better. To prevent the spread of germs, wash your hands well with soap and water. Have others do the same. Do not share food, drinking cups,  or personal items. Get help right away if you have a bad headache, chest pain, shortness of breath, a stiff or painful neck, or you vomit. This information is not intended to replace advice given to you by your health care provider. Make sure you discuss any questions you have with your health care provider. Document Revised: 12/20/2020 Document Reviewed: 12/20/2020 Elsevier Patient Education  2023 Elsevier Inc.  

## 2022-01-05 NOTE — Progress Notes (Signed)
?[name] - 31 y.o. female MRN [id number]  Date of birth: Feb 01, 1991 ? ?Subjective ?Chief Complaint  ?Patient presents with  ? Sore Throat  ? ? ?HPI ?Maria Payne is a 31 y.o. female here today with complaint of sore throat.  She has had sore throat off and on for the past 3 weeks. Seen for e-visit a few weeks ago and treated for sinusitis with doxycycline.  Didn't really have improvement with this.  Has severe pain with swallowing that is often worse at night and in the morning.  She has not had much post nasal drainage.  She has noted some increased reflux.   ? ?ROS:  A comprehensive ROS was completed and negative except as noted per HPI ? ?Allergies  ?Allergen Reactions  ? Latex   ?  Other reaction(s): Other ?Diagnosed with blood test  ? Nsaids   ?  Other reaction(s): Other ?contraindication  ? Codeine   ? Hydrocodone-Acetaminophen Rash  ?  Headaches ?Morphine, Dilaudid OK  ? Oxycodone Rash  ?  Headaches  ? Tape Rash  ?  welps  ? ? ?Past Medical History:  ?Diagnosis Date  ? Asthma   ? 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 Admitted to [facility] on 08/21/11. Had diagnostic cerebral arteriogram.     ? Depression   ? GAD (generalized anxiety disorder) 04/04/2012  ? Papilledema   ? Diamox  ? Pseudotumor cerebri   ? ? ?Past Surgical History:  ?Procedure Laterality Date  ? ANGIOPLASTY  2012  ? ? cerebral artery   ? CHOLECYSTECTOMY    ? SHUNT REVISION    ? 4 times, last time 07/24/19  ? SLEEVE GASTROPLASTY  05/2014  ? Dr. Evorn Gong   ? Stent placed  August 2014  ? Stenosis right transverse sigmoid junction,[facility]  ? VENTRICULAR ATRIAL SHUNT    ? VP shunt  11/2013  ? Done at [facility]  ? ? ?Social History  ? ?Socioeconomic History  ? Marital status: Married  ?  Spouse name: Not on file  ? Number of children: 1  ? Years of education: Not on file  ? Highest education level: Not on file  ?Occupational History  ? Occupation: Logistics  ?Tobacco Use  ? Smoking status: Never  ? Smokeless tobacco: Never  ?Vaping Use  ? Vaping Use:  Never used  ?Substance and Sexual Activity  ? Alcohol use: Not Currently  ? Drug use: No  ? Sexual activity: Yes  ?  Partners: Male  ?  Birth control/protection: None  ?Other Topics Concern  ? Not on file  ?Social History Narrative  ? No exercise. No Caffeine.   ? ?Social Determinants of Health  ? ?Financial Resource Strain: Not on file  ?Food Insecurity: Not on file  ?Transportation Needs: Not on file  ?Physical Activity: Not on file  ?Stress: Not on file  ?Social Connections: Not on file  ? ? ?Family History  ?Problem Relation Age of Onset  ? Diabetes Mother   ? Hypertension Father   ? Heart disease Father   ? Diabetes Father   ? Hyperlipidemia Sister   ? Endometriosis Sister   ? Thyroid disease Sister   ? Colon cancer Neg Hx   ? Stomach cancer Neg Hx   ? Pancreatic cancer Neg Hx   ? Esophageal cancer Neg Hx   ? Rectal cancer Neg Hx   ? ? ?Health Maintenance  ?Topic Date Due  ? Hepatitis C Screening  01/06/2023 (Originally 02/22/2008)  ?  COVID-19 Vaccine (1) 07/09/2023 (Originally [birth year]/1990)  ? INFLUENZA VACCINE  04/10/2022  ? TETANUS/TDAP  03/23/2024  ? PAP SMEAR-Modifier  11/30/2026  ? HIV Screening  Completed  ? Pneumococcal Vaccine 15-65 Years old  Aged Out  ? HPV VACCINES  Aged Out  ? ? ? ?----------------------------------------------------------------------------------------------------------------------------------------------------------------------------------------------------------------- ?Physical Exam ?BP 125/86 (BP Location: Left Arm, Patient Position: Sitting, Cuff Size: Large)   Pulse 90   Temp 98.3 ?F (36.8 ?C) (Oral)   Ht '5\' 8"'$  (1.727 m)   Wt 266 lb (120.7 kg)   SpO2 96%   BMI 40.45 kg/m?  ? ?Physical Exam ?Constitutional:   ?   Appearance: Normal appearance.  ?HENT:  ?   Mouth/Throat:  ?   Comments: Posterior oropharynx is erythematous.  Tonsils are absent.   ? ?Eyes:  ?   General: No scleral icterus. ?Cardiovascular:  ?   Rate and Rhythm: Normal rate and regular rhythm.  ?Pulmonary:  ?    Effort: Pulmonary effort is normal.  ?   Breath sounds: Normal breath sounds.  ?Lymphadenopathy:  ?   Cervical: Cervical adenopathy (shotty anterior adenopathy.) present.  ?Neurological:  ?   Mental Status: She is alert.  ? ? ?------------------------------------------------------------------------------------------------------------------------------------------------------------------------------------------------------------------- ?Assessment and Plan ? ?Strep pharyngitis ?Positive rapid strep test.  Treating with amoxicillin '500mg'$  bid x10 days.  Recommend increased fluids and OTC analgesics as needed.   She has some increased reflux as well which may be causing further irritation and I recommend she do a 2 week trial of omeprazole.  I am adding fluconazole as well as she tends to develop yeast infections after antibiotic usage.   ? ? ?Meds ordered this encounter  ?Medications  ? amoxicillin (AMOXIL) 500 MG capsule  ?  Sig: Take 1 capsule (500 mg total) by mouth 2 (two) times daily for 10 days.  ?  Dispense:  20 capsule  ?  Refill:  0  ? fluconazole (DIFLUCAN) 150 MG tablet  ?  Sig: Take 1 tablet (150 mg total) by mouth once for 1 dose. May repeat after 72 hours if needed.  ?  Dispense:  2 tablet  ?  Refill:  0  ? ? ?No follow-ups on file. ? ? ? ?This visit occurred during the SARS-CoV-2 public health emergency.  Safety protocols were in place, including screening questions prior to the visit, additional usage of staff PPE, and extensive cleaning of exam room while observing appropriate contact time as indicated for disinfecting solutions.  ? ?

## 2022-01-05 NOTE — Assessment & Plan Note (Signed)
Positive rapid strep test.  Treating with amoxicillin '500mg'$  bid x10 days.  Recommend increased fluids and OTC analgesics as needed.   She has some increased reflux as well which may be causing further irritation and I recommend she do a 2 week trial of omeprazole.  I am adding fluconazole as well as she tends to develop yeast infections after antibiotic usage.   ?

## 2022-01-30 ENCOUNTER — Telehealth (INDEPENDENT_AMBULATORY_CARE_PROVIDER_SITE_OTHER): Payer: Medicaid Other | Admitting: Family Medicine

## 2022-01-30 ENCOUNTER — Encounter: Payer: Self-pay | Admitting: Family Medicine

## 2022-01-30 VITALS — Temp 98.5°F

## 2022-01-30 DIAGNOSIS — J019 Acute sinusitis, unspecified: Secondary | ICD-10-CM | POA: Diagnosis not present

## 2022-01-30 MED ORDER — PREDNISONE 20 MG PO TABS
40.0000 mg | ORAL_TABLET | Freq: Every day | ORAL | 0 refills | Status: DC
Start: 2022-01-30 — End: 2022-05-22

## 2022-01-30 NOTE — Progress Notes (Signed)
Pt reports that this has been the 3rd/4th time that she has been seen for this.  It is mostly on her L side and has moved down into her chest. She has been taking tylenol sinus, Mucinex, and tylenol. She has been taking Claritin daily.   She has felt "hot" on the inside and states that her face has been hot. Her ears have been popping and feel clogged. Mucus has been dark yellow.   No F/S/C. She tested Negative this morning.

## 2022-01-30 NOTE — Progress Notes (Signed)
    Virtual Visit via Video Note  I connected with SHERILL MANGEN on 01/30/22 at  2:40 PM EDT by a video enabled telemedicine application and verified that I am speaking with the correct person using two identifiers.   I discussed the limitations of evaluation and management by telemedicine and the availability of in person appointments. The patient expressed understanding and agreed to proceed.  Patient location: at home Provider location: in office  Subjective:    CC:   Chief Complaint  Patient presents with   Sinusitis    HPI:  She reports that yesterday morning she woke up with some throat irritation.  And then today started experiencing more facial pain and pressure on the left side of her facial cheek.  She says this is about the third or fourth time that she has gotten sick and had a lot of pain and pressure in the left maxillary cheek.  She has had some postnasal drip especially at night when she lays down she feels like that left side is just full and congested.  She is also had a cough from the drainage.  No sore throat.  Some slight wheezing in her chest.  She says this morning the facial cheek was red and felt hot to touch but did not look swollen in the mirror.  No fevers.  Past medical history, Surgical history, Family history not pertinant except as noted below, Social history, Allergies, and medications have been entered into the medical record, reviewed, and corrections made.    Objective:    General: Speaking clearly in complete sentences without any shortness of breath.  Alert and oriented x3.  Normal judgment. No apparent acute distress.    Impression and Recommendations:    Problem List Items Addressed This Visit   None Visit Diagnoses     Acute non-recurrent sinusitis, unspecified location    -  Primary   Relevant Medications   predniSONE (DELTASONE) 20 MG tablet       Cute sinusitis-too early to tell if viral or bacterial so we did discuss a round  of prednisone as well as over-the-counter cough and cold medications if she is not better in 1 week consider trial of antibiotic.  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  So we will go ahead and place referral today as well.     No orders of the defined types were placed in this encounter.   Meds ordered this encounter  Medications   predniSONE (DELTASONE) 20 MG tablet    Sig: Take 2 tablets (40 mg total) by mouth daily with breakfast.    Dispense:  10 tablet    Refill:  0     I discussed the assessment and treatment plan with the patient. The patient was provided an opportunity to ask questions and all were answered. The patient agreed with the plan and demonstrated an understanding of the instructions.   The patient was advised to call back or seek an in-person evaluation if the symptoms worsen or if the condition fails to improve as anticipated.   [name], MD

## 2022-03-14 ENCOUNTER — Encounter: Payer: Self-pay | Admitting: Family Medicine

## 2022-03-15 ENCOUNTER — Encounter: Payer: Self-pay | Admitting: Family Medicine

## 2022-03-15 ENCOUNTER — Telehealth: Payer: Medicaid Other | Admitting: Family Medicine

## 2022-03-15 VITALS — Resp 18 | Ht 68.0 in | Wt 258.0 lb

## 2022-03-15 DIAGNOSIS — H938X1 Other specified disorders of right ear: Secondary | ICD-10-CM

## 2022-03-15 DIAGNOSIS — Z7689 Persons encountering health services in other specified circumstances: Secondary | ICD-10-CM

## 2022-03-15 NOTE — Progress Notes (Signed)
Virtual Visit via Video Note  I connected with Maria Payne on 03/15/22 at  8:30 AM EDT by a video enabled telemedicine application and verified that I am speaking with the correct person using two identifiers.   I discussed the limitations of evaluation and management by telemedicine and the availability of in person appointments. The patient expressed understanding and agreed to proceed.  Patient location: at home Provider location: in office  Subjective:    CC:   Chief Complaint  Patient presents with   Ear Pain    Patient complains of right ear pain, head/chest congestion. Patient states she had a URI 1 week ago. Patient has tried Mucinex and Tylenol    Discuss Weight     Patient states she has PCOS and feels that she is not losing weight with eating healthy and exercise.     HPI: Patient complains of right ear fullness and pain.  She says about a week ago she started getting sick with a cough like bronchitis.  It eventually went up into her sinuses with some congestion.  She says she actually started feeling a little better yesterday but then last night sneezed really hard and her right ear popped and she really feels like she cannot hear out of it right now.  No drainage from the ear.  No fevers chills or sweats.  Over the last 2 months she has been working on weight loss she is really cut back on sugars she is cut out soda.  She is doing no carb.  She has been exercising with walking and doing some type of program on [employer] at least 3 days a week.  She has lost 8 pounds in total over 8 weeks.  She is doing this with a friend and her friend has lost a little bit more weight so she was concerned.  Past medical history, Surgical history, Family history not pertinant except as noted below, Social history, Allergies, and medications have been entered into the medical record, reviewed, and corrections made.    Objective:    General: Speaking clearly in complete sentences  without any shortness of breath.  Alert and oriented x3.  Normal judgment. No apparent acute distress.    Impression and Recommendations:    Problem List Items Addressed This Visit       Other   Encounter for weight management   Other Visit Diagnoses     Ear fullness, right    -  Primary      Right ear fullness  - secondary to viral illness-we discussed a trial of a nasal steroid spray if that does not help we will consider oral prednisone.  Worst case may have perforated eardrum.  So if its not improving then we may need to come in to have her do an exam.  Weight management-she is actually doing a fantastic job.  She does feel like the PCOS has been a little bit more difficult difficult to lose.  I think she is doing of phenomenal job and I think 1 pound a week is perfect especially with making changes and being able to stick to those changes.  We did discuss adding in some resistance training to her routine at least 2 to 3 days/week if possible.  Continue to work on increasing vegetable intake and eating lean proteins.  If at any point she feels stuck or plateaued then please let me know.  She is intolerant to metformin.  No orders of the defined  types were placed in this encounter.   No orders of the defined types were placed in this encounter.    I discussed the assessment and treatment plan with the patient. The patient was provided an opportunity to ask questions and all were answered. The patient agreed with the plan and demonstrated an understanding of the instructions.   The patient was advised to call back or seek an in-person evaluation if the symptoms worsen or if the condition fails to improve as anticipated.   Maria Lecher, MD

## 2022-03-15 NOTE — Telephone Encounter (Signed)
Patient scheduled.

## 2022-03-30 ENCOUNTER — Ambulatory Visit: Payer: Medicaid Other | Admitting: Physician Assistant

## 2022-04-27 ENCOUNTER — Ambulatory Visit: Payer: Medicaid Other | Admitting: Family Medicine

## 2022-04-27 ENCOUNTER — Ambulatory Visit (INDEPENDENT_AMBULATORY_CARE_PROVIDER_SITE_OTHER): Payer: Medicaid Other

## 2022-04-27 ENCOUNTER — Encounter: Payer: Self-pay | Admitting: Family Medicine

## 2022-04-27 DIAGNOSIS — N760 Acute vaginitis: Secondary | ICD-10-CM | POA: Diagnosis not present

## 2022-04-27 DIAGNOSIS — S99921A Unspecified injury of right foot, initial encounter: Secondary | ICD-10-CM

## 2022-04-27 DIAGNOSIS — M79671 Pain in right foot: Secondary | ICD-10-CM

## 2022-04-27 DIAGNOSIS — R300 Dysuria: Secondary | ICD-10-CM | POA: Diagnosis not present

## 2022-04-27 LAB — POCT URINALYSIS DIP (CLINITEK)
Bilirubin, UA: NEGATIVE
Glucose, UA: NEGATIVE mg/dL
Ketones, POC UA: NEGATIVE mg/dL
Leukocytes, UA: NEGATIVE
Nitrite, UA: NEGATIVE
POC PROTEIN,UA: NEGATIVE
Spec Grav, UA: 1.025 (ref 1.010–1.025)
Urobilinogen, UA: 0.2 E.U./dL
pH, UA: 5.5 (ref 5.0–8.0)

## 2022-04-27 MED ORDER — FLUCONAZOLE 150 MG PO TABS: 150.0000 mg | ORAL_TABLET | Freq: Once | ORAL | 0 refills | Status: AC

## 2022-04-27 NOTE — Patient Instructions (Signed)
Recommend shoe for about 2 weeks during the day and then wean as tolerated without having pain in the toe.

## 2022-04-27 NOTE — Progress Notes (Signed)
Acute Office Visit  Subjective:     Patient ID: Maria Payne, female    DOB: [birth year], 31 y.o.   MRN: [id number]  Chief Complaint  Patient presents with   Follow-up    Rt toe pain and dysuria    HPI Patient is in today for vaginal itching and irritation that started about 2 days ago.  It also just feels extremely painful when she wipes.  She did start her period last night.  Right foot injury - soda can dropped on her big toe about 4 days ago.  Its been very painful to bend the toe but she is able to do so.  It just feels very tight and sore.  When she does move it the pain radiates from her toe all the way up her foot to her ankle.  She has been trying to ice it she has been using Tylenol she cannot take NSAIDs.   ROS      Objective:    There were no vitals taken for this visit.   Physical Exam Vitals reviewed.  Constitutional:      Appearance: She is well-developed.  HENT:     Head: Normocephalic and atraumatic.  Eyes:     Conjunctiva/sclera: Conjunctivae normal.  Cardiovascular:     Rate and Rhythm: Normal rate.  Pulmonary:     Effort: Pulmonary effort is normal.  Musculoskeletal:     Comments: Right great toe is mildly erythematous over the distal joint.  She is able to flex and extend but it is swollen and very tender directly over the joint.  Skin:    General: Skin is dry.     Coloration: Skin is not pale.  Neurological:     Mental Status: She is alert and oriented to person, place, and time.  Psychiatric:        Behavior: Behavior normal.     Results for orders placed or performed in visit on 04/27/22  POCT URINALYSIS DIP (CLINITEK)  Result Value Ref Range   Color, UA yellow yellow   Clarity, UA clear clear   Glucose, UA negative negative mg/dL   Bilirubin, UA negative negative   Ketones, POC UA negative negative mg/dL   Spec Grav, UA 1.025 1.010 - 1.025   Blood, UA moderate (A) negative   pH, UA 5.5 5.0 - 8.0   POC PROTEIN,UA negative  negative, trace   Urobilinogen, UA 0.2 0.2 or 1.0 E.U./dL   Nitrite, UA Negative Negative   Leukocytes, UA Negative Negative        Assessment & Plan:   Problem List Items Addressed This Visit   None Visit Diagnoses     Dysuria    -  Primary   Relevant Orders   WET PREP FOR [place], YEAST, CLUE   POCT URINALYSIS DIP (CLINITEK) (Completed)   Injury of right foot, initial encounter       Relevant Orders   DG Toe Great Right   Right foot pain       Relevant Orders   DG Toe Great Right   Acute vaginitis       Relevant Medications   fluconazole (DIFLUCAN) 150 MG tablet       Acute vaginitis-most consistent with yeast.  Wet prep sent and urinalysis performed.  Will call with results once available.  Right toe/foot injury-we will place order for x-ray for evaluation of fracture.  Reviewed the x-ray myself and did not see a fracture.  But I think for comfort  and mobility we will have placed her in a postop shoe.  Recommend shoe for about 2 weeks during the day and then wean as tolerated without having pain in the toe.   Meds ordered this encounter  Medications   fluconazole (DIFLUCAN) 150 MG tablet    Sig: Take 1 tablet (150 mg total) by mouth once for 1 dose.    Dispense:  1 tablet    Refill:  0    No follow-ups on file.  [name], MD

## 2022-04-28 LAB — WET PREP FOR TRICH, YEAST, CLUE
MICRO NUMBER:: 13800962
Specimen Quality: ADEQUATE

## 2022-04-30 MED ORDER — METRONIDAZOLE 500 MG PO TABS: 500.0000 mg | ORAL_TABLET | Freq: Three times a day (TID) | ORAL | 0 refills | Status: AC

## 2022-04-30 NOTE — Progress Notes (Signed)
Hi Maria Payne,   The wet prep is positive for overgrowth of bacteria called bacterial vaginitis.  Get a send over prescription to clear this up.  No sign of yeast infection.

## 2022-04-30 NOTE — Addendum Note (Signed)
Addended by: [name] D on: 04/30/2022 04:01 PM   Modules accepted: Orders

## 2022-05-01 NOTE — Progress Notes (Signed)
Hi Maria Payne, great news is there is no sign of fracture.  I was surprised it took them several days to reach her film.  The flat shoe is been helpful I would just recommend wearing it for couple of weeks and at that point you can start tapering off may be wearing it for half a day and then taking off her half her day and just making sure that your foot feels comfortable if you start having increased pain and then go back to wearing it a little longer and if you are doing well then you can continue to just decrease how often you are using the flat shoe.

## 2022-05-21 ENCOUNTER — Encounter: Payer: Self-pay | Admitting: Family Medicine

## 2022-05-21 NOTE — Telephone Encounter (Signed)
I called and scheduled patient.

## 2022-05-22 ENCOUNTER — Ambulatory Visit: Payer: Medicaid Other | Admitting: Family Medicine

## 2022-05-22 ENCOUNTER — Ambulatory Visit (HOSPITAL_BASED_OUTPATIENT_CLINIC_OR_DEPARTMENT_OTHER)
Admission: RE | Admit: 2022-05-22 | Discharge: 2022-05-22 | Disposition: A | Discharge status: Home / Self Care | Payer: Medicaid Other | Source: Ambulatory Visit | Attending: Family Medicine | Admitting: Family Medicine

## 2022-05-22 ENCOUNTER — Encounter: Payer: Self-pay | Admitting: Family Medicine

## 2022-05-22 VITALS — BP 137/97 | HR 87 | Temp 98.4°F | Wt 259.0 lb

## 2022-05-22 DIAGNOSIS — Z20822 Contact with and (suspected) exposure to covid-19: Secondary | ICD-10-CM | POA: Diagnosis not present

## 2022-05-22 DIAGNOSIS — R509 Fever, unspecified: Secondary | ICD-10-CM | POA: Insufficient documentation

## 2022-05-22 DIAGNOSIS — R1030 Lower abdominal pain, unspecified: Secondary | ICD-10-CM | POA: Diagnosis not present

## 2022-05-22 DIAGNOSIS — R102 Pelvic and perineal pain: Secondary | ICD-10-CM | POA: Insufficient documentation

## 2022-05-22 DIAGNOSIS — Z23 Encounter for immunization: Secondary | ICD-10-CM | POA: Diagnosis not present

## 2022-05-22 LAB — POCT URINALYSIS DIP (CLINITEK)
Bilirubin, UA: NEGATIVE
Glucose, UA: NEGATIVE mg/dL
Ketones, POC UA: NEGATIVE mg/dL
Nitrite, UA: NEGATIVE
POC PROTEIN,UA: NEGATIVE
Spec Grav, UA: 1.015 (ref 1.010–1.025)
Urobilinogen, UA: 0.2 E.U./dL
pH, UA: 6 (ref 5.0–8.0)

## 2022-05-22 LAB — POCT URINE PREGNANCY: Preg Test, Ur: NEGATIVE

## 2022-05-22 LAB — POC COVID19 BINAXNOW: SARS Coronavirus 2 Ag: NEGATIVE

## 2022-05-22 MED ORDER — IOHEXOL 300 MG/ML  SOLN
100.0000 mL | Freq: Once | INTRAMUSCULAR | Status: AC | PRN
  Administered 2022-05-22: 100 mL via INTRAVENOUS

## 2022-05-22 NOTE — Progress Notes (Signed)
Acute Office Visit  Subjective:     Patient ID: Maria Payne, female    DOB: 10/02/1990, 31 y.o.   MRN: [id number]  Chief Complaint  Patient presents with   Ovarian Cyst    HPI Patient is in today for lower abd pain and low grade fever for a couple of days.  Period just started period and it is very heavy.  Last night fever spiked up to 101.4.  She says initially she was just having a little bit of discomfort like she does when she gets BV.  But in the last couple days it suddenly got worse.  She says its been painful enough that she has not been able to work its bilateral right and left lower quadrant pain and radiates around to her back and then upward slightly.  She also noticed some vaginal swelling.  No new diarrhea.  Some occasional dysuria.  She also has been exposed to [place].  Her daughter has COVID right now.  I had just seen her about 4 weeks ago for vaginal itching and irritation.  He was treated with fluconazole for possible yeast and then we got her wet prep back she was positive for clue cells so she was treated with metronidazole.  No other URI sxs.   ROS      Objective:    BP (!) 137/97   Pulse 87   Temp 98.4 F (36.9 C)   Wt 259 lb (117.5 kg)   SpO2 100%   BMI 39.38 kg/m    Physical Exam Vitals reviewed.  Constitutional:      Appearance: She is well-developed.  HENT:     Head: Normocephalic and atraumatic.  Eyes:     Conjunctiva/sclera: Conjunctivae normal.  Cardiovascular:     Rate and Rhythm: Normal rate.  Pulmonary:     Effort: Pulmonary effort is normal.  Skin:    General: Skin is dry.     Coloration: Skin is not pale.  Neurological:     Mental Status: She is alert and oriented to person, place, and time.  Psychiatric:        Behavior: Behavior normal.     Results for orders placed or performed in visit on 05/22/22  POC COVID-19  Result Value Ref Range   SARS Coronavirus 2 Ag Negative Negative  POCT URINALYSIS DIP (CLINITEK)   Result Value Ref Range   Color, UA yellow yellow   Clarity, UA cloudy (A) clear   Glucose, UA negative negative mg/dL   Bilirubin, UA negative negative   Ketones, POC UA negative negative mg/dL   Spec Grav, UA 1.015 1.010 - 1.025   Blood, UA large (A) negative   pH, UA 6.0 5.0 - 8.0   POC PROTEIN,UA negative negative, trace   Urobilinogen, UA 0.2 0.2 or 1.0 E.U./dL   Nitrite, UA Negative Negative   Leukocytes, UA Trace (A) Negative  POCT urine pregnancy  Result Value Ref Range   Preg Test, Ur Negative Negative        Assessment & Plan:   Problem List Items Addressed This Visit   None Visit Diagnoses     Pelvic pain    -  Primary   Relevant Orders   POC COVID-19 (Completed)   POCT URINALYSIS DIP (CLINITEK) (Completed)   WET PREP FOR TRICH, YEAST, CLUE   C. trachomatis/N. gonorrhoeae RNA   CT Abdomen Pelvis W Contrast   POCT urine pregnancy (Completed)   Fever, unspecified fever cause  Relevant Orders   POC COVID-19 (Completed)   CT Abdomen Pelvis W Contrast   Close exposure to COVID-19 virus          Pelvic pain-unclear etiology normally I would go ahead and do a pelvic exam at this point but she is bleeding heavily with her menstrual cycle so we will go ahead and try to do a wet prep and Aptima test though that may be affected by large amount of blood we will also check a urinalysis so again may be affected by large amount of blood.  I am concerned about the fever I do not know if it is related to the pelvic pain or if it could be possibly that she has COVID.  COVID test is negative. So suspect the fever is from the pelvic pain so that is more concerning. Will order CT abd/pelv.      No orders of the defined types were placed in this encounter.   No follow-ups on file.  [name], MD

## 2022-05-22 NOTE — Progress Notes (Signed)
Report of CT Abdomen and Pelvis done 05/22/22 called to Dr. Peterson Ao '@[id number]'$ :33 and were acknowledged by her

## 2022-05-23 LAB — WET PREP FOR TRICH, YEAST, CLUE
MICRO NUMBER:: 13905162
Specimen Quality: ADEQUATE

## 2022-05-23 LAB — C. TRACHOMATIS/N. GONORRHOEAE RNA

## 2022-05-23 NOTE — Progress Notes (Signed)
Maria Payne, ultrasound overall looks reassuring there is nothing specific causing your pain.  No kidney stones or diverticulitis or colitis.  The uterus and ovaries look good no sign of cysts or other issues to cause discomfort.  He is let me know how you are feeling today.

## 2022-05-23 NOTE — Progress Notes (Signed)
Initial swab is negative for clue cells or yeast.

## 2022-05-24 ENCOUNTER — Other Ambulatory Visit: Payer: Self-pay | Admitting: *Deleted

## 2022-05-24 DIAGNOSIS — R102 Pelvic and perineal pain: Secondary | ICD-10-CM

## 2022-05-24 NOTE — Progress Notes (Signed)
At this point I am not really sure what is causing her pain so the neck step would be to refer her to GYN for further work-up.  Let me know if she is okay with that plan.  Hopefully she is feeling a little better.  If she still running a fever I think she should try to do a home test for COVID again

## 2022-05-24 NOTE — Progress Notes (Signed)
Looks like sample was canceled.  Can we check on this.

## 2022-05-28 ENCOUNTER — Other Ambulatory Visit: Payer: Medicaid Other

## 2022-07-10 ENCOUNTER — Telehealth: Payer: Medicaid Other | Admitting: Family Medicine

## 2022-07-10 DIAGNOSIS — R0981 Nasal congestion: Secondary | ICD-10-CM

## 2022-07-10 DIAGNOSIS — R09A2 Foreign body sensation, throat: Secondary | ICD-10-CM

## 2022-07-10 NOTE — Patient Instructions (Addendum)
  Maria Payne, thank you for joining [name], NP for today's virtual visit.  While this provider is not your primary care provider (PCP), if your PCP is located in our provider database this encounter information will be shared with them immediately following your visit.   [facility] account gives you access to today's visit and all your visits, tests, and labs performed at [facility] " click here if you don't have a Maria Payne account or go to [email or web address]  Consent: (Patient) Maria Payne provided verbal consent for this virtual visit at the beginning of the encounter.  Current Medications:  Current Outpatient Medications:    albuterol (VENTOLIN HFA) 108 (90 Base) MCG/ACT inhaler, Inhale 2 puffs into the lungs every 4 (four) hours as needed for wheezing., Disp: 8 g, Rfl: 1   aspirin 81 MG chewable tablet, Chew by mouth daily., Disp: , Rfl:    Medications ordered in this encounter:  No orders of the defined types were placed in this encounter.    *If you need refills on other medications prior to your next appointment, please contact your pharmacy*  Follow-Up: Call back or seek an in-person evaluation if the symptoms worsen or if the condition fails to improve as anticipated.  [facility] [phone]  Other Instructions -Take meds as prescribed and discussed -Rest -Use a cool mist humidifier especially during the winter months when heat dries out the air. - Use saline nose sprays frequently to help soothe nasal passages and promote drainage. -Saline irrigations of the nose can be very helpful if done frequently.             * 4X daily for 1 week*             * Use of a nettie pot can be helpful with this.  *Follow directions with this* *Boiled or distilled water only -stay hydrated by drinking plenty of fluids -flonase and or Claritin daily  -salt water gargles              If you have been instructed to  have an in-person evaluation today at a local Urgent Care facility, please use the link below. It will take you to a list of all of our available [facility], including address, phone number and hours of operation. Please do not delay care.  [facility]  If you or a family member do not have a primary care provider, use the link below to schedule a visit and establish care. When you choose a [facility] primary care physician or advanced practice provider, you gain a long-term partner in health. Find a Primary Care Provider  Learn more about [facility]'s in-office and virtual care options: [facility]

## 2022-07-10 NOTE — Progress Notes (Signed)
Virtual Visit Consent   [name], you are scheduled for a virtual visit with a [place] provider today. Just as with appointments in the office, your consent must be obtained to participate. Your consent will be active for this visit and any virtual visit you may have with one of our providers in the next 365 days. If you have a MyChart account, a copy of this consent can be sent to you electronically.  As this is a virtual visit, video technology does not allow for your provider to perform a traditional examination. This may limit your provider's ability to fully assess your condition. If your provider identifies any concerns that need to be evaluated in person or the need to arrange testing (such as labs, EKG, etc.), we will make arrangements to do so. Although advances in technology are sophisticated, we cannot ensure that it will always work on either your end or our end. If the connection with a video visit is poor, the visit may have to be switched to a telephone visit. With either a video or telephone visit, we are not always able to ensure that we have a secure connection.  By engaging in this virtual visit, you consent to the provision of healthcare and authorize for your insurance to be billed (if applicable) for the services provided during this visit. Depending on your insurance coverage, you may receive a charge related to this service.  I need to obtain your verbal consent now. Are you willing to proceed with your visit today? [name] has provided verbal consent on 07/10/2022 for a virtual visit (video or telephone). [name], NP  Date: 07/10/2022 11:17 AM  Virtual Visit via Video Note   I, [name], connected with  Maria Payne  ([id number], [birth year]) on 07/10/22 at 11:15 AM EDT by a video-enabled telemedicine application and verified that I am speaking with the correct person using two identifiers.  Location: Patient: Virtual Visit Location Patient:  Home Provider: Virtual Visit Location Provider: Home Office   I discussed the limitations of evaluation and management by telemedicine and the availability of in person appointments. The patient expressed understanding and agreed to proceed.    History of Present Illness: Maria Payne is a 31 y.o. who identifies as a female who was assigned female at birth, and is being seen today for flesh bump/blister in back of throat. Feels like a lump is in throat on right side when swallowing. Does not have tonsils. Did have strep in April of this year- this does not feel the same and she has not been exposed to strep that she is aware of. Did note some PND yesterday and took mucinex for it. Denies other URI symptoms, fever, chills, chest pain or shortness of breath.    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/13/2019   Severe persistent asthma with exacerbation 09/09/2019   Hypertension 08/14/2019   BMI 38.0-38.9,adult 04/23/2019   Shunt malfunction 04/20/2017   S/P ventriculoperitoneal shunt 11/30/2016   Pain of upper abdomen 11/21/2015   Psychic factors associated with diseases classified elsewhere 12/31/2013   Anxiety state 12/31/2013   MDD (major depressive  disorder) 04/04/2012   GAD (generalized anxiety disorder) 04/04/2012   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   IIH (idiopathic intracranial hypertension) 09/10/2011   Papilledema associated with increased intracranial pressure 08/20/2011   DERMATITIS, PERIORAL 05/17/2010    Allergies:  Allergies  Allergen Reactions   Latex     Other reaction(s): Other Diagnosed with blood test    Nsaids     Other reaction(s): Other contraindication   Codeine    Hydrocodone-Acetaminophen Rash    Headaches Morphine, Dilaudid OK   Oxycodone Rash    Headaches   Tape Rash    welps   Medications:  Current Outpatient Medications:    albuterol (VENTOLIN HFA) 108 (90 Base) MCG/ACT inhaler, Inhale 2 puffs into the lungs every 4 (four) hours as needed for wheezing., Disp: 8 g, Rfl: 1   aspirin 81 MG chewable tablet, Chew by mouth daily., Disp: , Rfl:   Observations/Objective: Patient is well-developed, well-nourished in no acute distress.  Resting comfortably  at home.  Head is normocephalic, atraumatic.  No labored breathing.  Speech is clear and coherent with logical content.  Patient is alert and oriented at baseline.    Assessment and Plan:  -Take meds as prescribed and discussed- flonase and claritin -Rest -Use a cool mist humidifier especially during the winter months when heat dries out the air. - Use saline nose sprays frequently to help soothe nasal passages and promote drainage. -Saline irrigations of the nose can be very helpful if done frequently.             * 4X daily for 1 week*             * Use of a nettie pot can be helpful with this.  *Follow directions with this* *Boiled or distilled water only -stay hydrated by drinking plenty of fluids -salt water gargles   Reviewed side effects, risks and benefits of medication.    Patient acknowledged agreement and understanding of the plan.   Past Medical, Surgical, Social History, Allergies, and Medications have been Reviewed.              Follow Up Instructions: I discussed the assessment and treatment plan with the patient. The patient was provided an opportunity to ask questions and all were answered. The patient agreed with the plan and demonstrated an understanding of the instructions.  A copy of instructions were sent to the patient via MyChart unless otherwise noted below.     The patient was  advised to call back or seek an in-person evaluation if the symptoms worsen or if the condition fails to improve as anticipated.  Time:  I spent 10 minutes with the patient via telehealth technology discussing the above problems/concerns.    [name], NP

## 2022-07-24 DIAGNOSIS — Z91040 Latex allergy status: Secondary | ICD-10-CM | POA: Diagnosis not present

## 2022-07-24 DIAGNOSIS — G932 Benign intracranial hypertension: Secondary | ICD-10-CM | POA: Diagnosis not present

## 2022-07-24 DIAGNOSIS — G43009 Migraine without aura, not intractable, without status migrainosus: Secondary | ICD-10-CM | POA: Diagnosis not present

## 2022-07-24 DIAGNOSIS — Z982 Presence of cerebrospinal fluid drainage device: Secondary | ICD-10-CM | POA: Diagnosis not present

## 2022-07-24 DIAGNOSIS — Z888 Allergy status to other drugs, medicaments and biological substances status: Secondary | ICD-10-CM | POA: Diagnosis not present

## 2022-07-24 DIAGNOSIS — Z885 Allergy status to narcotic agent status: Secondary | ICD-10-CM | POA: Diagnosis not present

## 2022-07-24 DIAGNOSIS — R200 Anesthesia of skin: Secondary | ICD-10-CM | POA: Diagnosis not present

## 2022-07-24 DIAGNOSIS — G43909 Migraine, unspecified, not intractable, without status migrainosus: Secondary | ICD-10-CM | POA: Diagnosis not present

## 2022-07-24 DIAGNOSIS — R519 Headache, unspecified: Secondary | ICD-10-CM | POA: Diagnosis not present

## 2022-07-24 DIAGNOSIS — Z9109 Other allergy status, other than to drugs and biological substances: Secondary | ICD-10-CM | POA: Diagnosis not present

## 2022-07-24 DIAGNOSIS — Z886 Allergy status to analgesic agent status: Secondary | ICD-10-CM | POA: Diagnosis not present

## 2022-07-30 DIAGNOSIS — G932 Benign intracranial hypertension: Secondary | ICD-10-CM | POA: Diagnosis not present

## 2022-08-01 ENCOUNTER — Ambulatory Visit: Payer: Medicaid Other | Admitting: Family Medicine

## 2022-08-01 ENCOUNTER — Encounter: Payer: Self-pay | Admitting: Family Medicine

## 2022-08-01 VITALS — BP 128/85 | HR 93 | Ht 68.0 in | Wt 261.0 lb

## 2022-08-01 DIAGNOSIS — E282 Polycystic ovarian syndrome: Secondary | ICD-10-CM

## 2022-08-01 DIAGNOSIS — R7309 Other abnormal glucose: Secondary | ICD-10-CM

## 2022-08-01 DIAGNOSIS — R030 Elevated blood-pressure reading, without diagnosis of hypertension: Secondary | ICD-10-CM

## 2022-08-01 DIAGNOSIS — Z7689 Persons encountering health services in other specified circumstances: Secondary | ICD-10-CM

## 2022-08-01 DIAGNOSIS — R7301 Impaired fasting glucose: Secondary | ICD-10-CM

## 2022-08-01 DIAGNOSIS — R6882 Decreased libido: Secondary | ICD-10-CM | POA: Diagnosis not present

## 2022-08-01 DIAGNOSIS — G932 Benign intracranial hypertension: Secondary | ICD-10-CM | POA: Diagnosis not present

## 2022-08-01 DIAGNOSIS — I10 Essential (primary) hypertension: Secondary | ICD-10-CM

## 2022-08-01 LAB — POCT GLYCOSYLATED HEMOGLOBIN (HGB A1C): Hemoglobin A1C: 5.7 % — AB (ref 4.0–5.6)

## 2022-08-01 MED ORDER — OZEMPIC (0.25 OR 0.5 MG/DOSE) 2 MG/3ML ~~LOC~~ SOPN: 0.2500 mg | PEN_INJECTOR | SUBCUTANEOUS | 0 refills | Status: DC

## 2022-08-01 MED ORDER — LOSARTAN POTASSIUM 25 MG PO TABS: 25.0000 mg | ORAL_TABLET | Freq: Every day | ORAL | 0 refills | Status: DC

## 2022-08-01 NOTE — Assessment & Plan Note (Signed)
Discussed the psychological part of low libido and really working on spending time with her husband to feel very connected.  Also continuing to work on regular exercise and adequate sleep.  We also discussed body image which she is concerned about and would like to lose weight.  We also discussed the possibility of using Wellbutrin which can some times be helpful in some patients for improving libido.

## 2022-08-01 NOTE — Progress Notes (Signed)
Established Patient Office Visit  Subjective   Patient ID: VERSA CRATON, female    DOB: 1991/03/19  Age: 31 y.o. MRN: [id number]  Chief Complaint  Patient presents with   Follow-up    Pt was seen in the ED on 11/14 her BS was 128 and her blood pressure was 173/107    HPI Maria Payne is here today for follow-up she was recently seen in the emergency department on November 14 for migraine headache.  But they noted while she was there that her blood sugar was mildly elevated at 128 and her blood pressure was very high at 173/100.  Since then she has followed up with Dr. Sung Amabile for idiopathic intracranial hypertension.  Is been tracking her blood pressures at home since then and has been getting blood pressures as high as 170s over 90s.  She also occasionally feels her heart feeling like it is just fluttering or skipping beats at times.  Also noting decreased sex drive.  She says its been present for about 3 years since the birth of her daughter.  She says her husband works out of town during the week and comes home on the weekends that starting to become a problem.  He also still struggling with weight loss.  She says she is still trying to eat really healthy and she is exercising regularly but cannot seem to get the weight off.  She did try the metformin to assist with weight loss but it caused diarrhea and vomiting.  She does have a history of PCOS.  She would like to have her A1c checked again today.    ROS    Objective:     BP 128/85   Pulse 93   Ht '5\' 8"'$  (1.727 m)   Wt 261 lb (118.4 kg)   SpO2 100%   BMI 39.68 kg/m    Physical Exam Vitals and nursing note reviewed.  Constitutional:      Appearance: She is well-developed.  HENT:     Head: Normocephalic and atraumatic.  Cardiovascular:     Rate and Rhythm: Normal rate and regular rhythm.     Heart sounds: Normal heart sounds.  Pulmonary:     Effort: Pulmonary effort is normal.     Breath sounds: Normal breath sounds.   Skin:    General: Skin is warm and dry.  Neurological:     Mental Status: She is alert and oriented to person, place, and time.  Psychiatric:        Behavior: Behavior normal.    Results for orders placed or performed in visit on 08/01/22  POCT glycosylated hemoglobin (Hb A1C)  Result Value Ref Range   Hemoglobin A1C 5.7 (A) 4.0 - 5.6 %   HbA1c POC (<> result, manual entry)     HbA1c, POC (prediabetic range)     HbA1c, POC (controlled diabetic range)        The ASCVD Risk score (Arnett DK, et al., 2019) failed to calculate for the following reasons:   The 2019 ASCVD risk score is only valid for ages 38 to 37    Assessment & Plan:   Problem List Items Addressed This Visit       Cardiovascular and Mediastinum   Hypertension    Blood pressures look fantastic here today but home blood pressures have been high it was high when she went to the emergency room so we discussed starting a low-dose medication.  Goal is to keep systolics under 542 and diastolic under  85.  She is also going to bring in her home cuff at her follow-up in 1 month so that we can make sure that it is also accurate and that its not reading high.  It is a wrist cuff.  We will start losartan 25 mg daily.      Relevant Medications   losartan (COZAAR) 25 MG tablet     Endocrine   PCOS (polycystic ovarian syndrome)    Intolerant to metformin.  Unfortunately her A1c is now in the prediabetes range at 5.7.  We will see if her insurance might cover any of the newer GLP-1 products I think she would benefit greatly.  I think it would help with her insulin resistance as well as weight, and hypertension.      Relevant Medications   Semaglutide,0.25 or 0.'5MG'$ /DOS, (OZEMPIC, 0.25 OR 0.5 MG/DOSE,) 2 MG/3ML SOPN   IFG (impaired fasting glucose)    5.7 today in the prediabetes range.  Intolerant to metformin.  We will see if the insurance will be willing to cover one of the GLP drugs for prediabetes with a combination of  PCOS and I IH      Relevant Medications   Semaglutide,0.25 or 0.'5MG'$ /DOS, (OZEMPIC, 0.25 OR 0.5 MG/DOSE,) 2 MG/3ML SOPN     Nervous and Auditory   IIH (idiopathic intracranial hypertension)    Lumbar puncture scheduled for Monday hopefully this will provide some relief.      Relevant Medications   Semaglutide,0.25 or 0.'5MG'$ /DOS, (OZEMPIC, 0.25 OR 0.5 MG/DOSE,) 2 MG/3ML SOPN     Other   Low libido    Discussed the psychological part of low libido and really working on spending time with her husband to feel very connected.  Also continuing to work on regular exercise and adequate sleep.  We also discussed body image which she is concerned about and would like to lose weight.  We also discussed the possibility of using Wellbutrin which can some times be helpful in some patients for improving libido.      Encounter for weight management   Other Visit Diagnoses     Abnormal glucose    -  Primary   Relevant Orders   POCT glycosylated hemoglobin (Hb A1C) (Completed)   Elevated BP without diagnosis of hypertension       Relevant Medications   losartan (COZAAR) 25 MG tablet       Return in about 4 weeks (around 08/29/2022) for recheck BP with PCP.    [name], MD

## 2022-08-01 NOTE — Assessment & Plan Note (Signed)
Intolerant to metformin.  Unfortunately her A1c is now in the prediabetes range at 5.7.  We will see if her insurance might cover any of the newer GLP-1 products I think she would benefit greatly.  I think it would help with her insulin resistance as well as weight, and hypertension.

## 2022-08-01 NOTE — Assessment & Plan Note (Signed)
Blood pressures look fantastic here today but home blood pressures have been high it was high when she went to the emergency room so we discussed starting a low-dose medication.  Goal is to keep systolics under 161 and diastolic under 85.  She is also going to bring in her home cuff at her follow-up in 1 month so that we can make sure that it is also accurate and that its not reading high.  It is a wrist cuff.  We will start losartan 25 mg daily.

## 2022-08-01 NOTE — Assessment & Plan Note (Addendum)
Lumbar puncture scheduled for Monday hopefully this will provide some relief.

## 2022-08-01 NOTE — Assessment & Plan Note (Signed)
5.7 today in the prediabetes range.  Intolerant to metformin.  We will see if the insurance will be willing to cover one of the GLP drugs for prediabetes with a combination of PCOS and I IH

## 2022-08-06 DIAGNOSIS — G932 Benign intracranial hypertension: Secondary | ICD-10-CM | POA: Diagnosis not present

## 2022-08-08 ENCOUNTER — Telehealth: Payer: Self-pay

## 2022-08-08 NOTE — Telephone Encounter (Signed)
Initiated Prior authorization [id number] (0.25 or 0.5 MG/DOSE) '2MG'$ /3ML pen-injectors Via: Covermymeds Case/Key:[id number] Status: approved  as of 08/08/22 Reason:Coverage Starts on: 08/08/2022 12:00:00 AM, Coverage Ends on: 08/08/2023 12:00:00 AM. Notified Pt via: Mychart

## 2022-08-13 DIAGNOSIS — Z982 Presence of cerebrospinal fluid drainage device: Secondary | ICD-10-CM | POA: Diagnosis not present

## 2022-08-13 DIAGNOSIS — G932 Benign intracranial hypertension: Secondary | ICD-10-CM | POA: Diagnosis not present

## 2022-08-14 ENCOUNTER — Ambulatory Visit: Payer: Medicaid Other | Admitting: Family Medicine

## 2022-08-20 ENCOUNTER — Telehealth: Payer: Medicaid Other | Admitting: Family Medicine

## 2022-08-20 DIAGNOSIS — H10023 Other mucopurulent conjunctivitis, bilateral: Secondary | ICD-10-CM

## 2022-08-20 MED ORDER — POLYMYXIN B-TRIMETHOPRIM 10000-0.1 UNIT/ML-% OP SOLN: 1.0000 [drp] | Freq: Four times a day (QID) | OPHTHALMIC | 0 refills | Status: DC

## 2022-08-20 NOTE — Progress Notes (Signed)

## 2022-08-29 ENCOUNTER — Ambulatory Visit: Payer: Medicaid Other | Admitting: Family Medicine

## 2022-08-29 VITALS — BP 126/77 | HR 83 | Ht 68.0 in | Wt 260.0 lb

## 2022-08-29 DIAGNOSIS — I10 Essential (primary) hypertension: Secondary | ICD-10-CM | POA: Diagnosis not present

## 2022-08-29 DIAGNOSIS — R7301 Impaired fasting glucose: Secondary | ICD-10-CM

## 2022-08-29 NOTE — Assessment & Plan Note (Addendum)
We discussed after her fourth injection of 0.25 mg on the Ozempic okay to go up to 0.5 mg.  Monitor for increase in symptoms particularly nausea after eating.  Continue to work on portion control and continue to work on [employer].  Also work on increasing activity and exercise level.  Plan to follow-up in about 6 to 8 weeks.  Visit #: 2 Starting Weight: 261 lbs  Current weight: 260 lbs Previous weight: 261 lbs Change in weight: down 1 lb  Goal weight: 200 lbs Dietary goals:increased veggies and protein.  Exercise goals: Medication: Ozempic 0.'25mg'$ , ok to increase to 0.'5mg'$  in 2 weeks.  Follow-up and referrals: 6-8 weeks.

## 2022-08-29 NOTE — Progress Notes (Signed)
   Established Patient Office Visit  Subjective   Patient ID: Maria Payne, female    DOB: 09-06-1991  Age: 31 y.o. MRN: [id number]  Chief Complaint  Patient presents with   Follow-up    HPI  She is here today for follow-up for hypertension.  She has been checking her blood pressures at home and getting some fantastic numbers around 120.  She did bring in her home cuff with her today so that we can compare it and make sure that it is accurate.  He did start the Ozempic about 3 weeks ago she will do her fourth injection at the end of the week.  She is tolerating it fairly well she is getting a little bit of nausea after she eats she has noticed a change in decrease in her appetite and noticed that certain foods do not seem to be nearly as appealing.  She has not noticed any drop in weight yet.     ROS    Objective:     BP 126/77   Pulse 83   Ht '5\' 8"'$  (1.727 m)   Wt 260 lb (117.9 kg)   SpO2 99%   BMI 39.53 kg/m    Physical Exam Vitals and nursing note reviewed.  Constitutional:      Appearance: She is well-developed.  HENT:     Head: Normocephalic and atraumatic.  Cardiovascular:     Rate and Rhythm: Normal rate and regular rhythm.     Heart sounds: Normal heart sounds.  Pulmonary:     Effort: Pulmonary effort is normal.     Breath sounds: Normal breath sounds.  Skin:    General: Skin is warm and dry.  Neurological:     Mental Status: She is alert and oriented to person, place, and time.  Psychiatric:        Behavior: Behavior normal.      No results found for any visits on 08/29/22.    The ASCVD Risk score (Arnett DK, et al., 2019) failed to calculate for the following reasons:   The 2019 ASCVD risk score is only valid for ages 23 to 85    Assessment & Plan:   Problem List Items Addressed This Visit       Cardiovascular and Mediastinum   Hypertension - Primary    Pressures look fantastic.  Brought in home cuff for confirmation today.         Endocrine   IFG (impaired fasting glucose)    We discussed after her fourth injection of 0.25 mg on the Ozempic okay to go up to 0.5 mg.  Monitor for increase in symptoms particularly nausea after eating.  Continue to work on portion control and continue to work on [employer].  Also work on increasing activity and exercise level.  Plan to follow-up in about 6 to 8 weeks.  Visit #: 2 Starting Weight: 261 lbs  Current weight: 260 lbs Previous weight: 261 lbs Change in weight: down 1 lb  Goal weight: 200 lbs Dietary goals:increased veggies and protein.  Exercise goals: Medication: Ozempic 0.'25mg'$ , ok to increase to 0.'5mg'$  in 2 weeks.  Follow-up and referrals: 6-8 weeks.         Return in about 6 weeks (around 10/10/2022) for Ozempic/Prediabetes .    [name], MD

## 2022-08-29 NOTE — Assessment & Plan Note (Signed)
Pressures look fantastic.  Brought in home cuff for confirmation today.

## 2022-09-10 ENCOUNTER — Other Ambulatory Visit: Payer: Self-pay | Admitting: Family Medicine

## 2022-09-10 DIAGNOSIS — R7301 Impaired fasting glucose: Secondary | ICD-10-CM

## 2022-09-10 DIAGNOSIS — G932 Benign intracranial hypertension: Secondary | ICD-10-CM

## 2022-09-10 DIAGNOSIS — E282 Polycystic ovarian syndrome: Secondary | ICD-10-CM

## 2022-09-17 DIAGNOSIS — G932 Benign intracranial hypertension: Secondary | ICD-10-CM | POA: Diagnosis not present

## 2022-09-24 ENCOUNTER — Encounter: Payer: Self-pay | Admitting: Obstetrics & Gynecology

## 2022-09-24 ENCOUNTER — Ambulatory Visit (INDEPENDENT_AMBULATORY_CARE_PROVIDER_SITE_OTHER): Payer: Medicaid Other | Admitting: Obstetrics & Gynecology

## 2022-09-24 VITALS — BP 133/81 | HR 81 | Ht 68.0 in | Wt 262.0 lb

## 2022-09-24 DIAGNOSIS — L731 Pseudofolliculitis barbae: Secondary | ICD-10-CM

## 2022-09-24 DIAGNOSIS — I10 Essential (primary) hypertension: Secondary | ICD-10-CM | POA: Diagnosis not present

## 2022-09-24 NOTE — Progress Notes (Signed)
GYN VISIT Patient name: Maria Payne MRN [id number]  Date of birth: Sep 06, 1991 Chief Complaint:   Vaginal Lesion  History of Present Illness:   Maria Payne is a 32 y.o. G46P0101 female being seen today for the following concerns:  Vaginal bump: Started a few days ago (maybe longer).  Noted a nickel-sized bump on the inside.  Of note, it has gotten smaller since she called.  In the past, she has been able to "break it open" on her own.  This one looks different, seems darker. Notes discomfort when she walks.  No other acute complaints She also note h/o Bartholin's cyst and ingrown hair.    Sexually active with same partner Pap up to date  Patient's last menstrual period was 09/10/2022.     08/29/2022   11:18 AM 08/01/2022   11:03 AM 05/22/2022   11:29 AM 11/29/2021    9:11 AM 05/25/2021   11:41 AM  Depression screen PHQ 2/9  Decreased Interest 0 1 0 1 3  Down, Depressed, Hopeless 0 1 0 1 3  PHQ - 2 Score 0 2 0 2 6  Altered sleeping  2  0 3  Tired, decreased energy  1  0 3  Change in appetite  1  0 3  Feeling bad or failure about yourself   1  0 3  Trouble concentrating  1  0 1  Moving slowly or fidgety/restless  1  0 3  Suicidal thoughts  0  0 0  PHQ-9 Score  '9  2 22  '$ Difficult doing work/chores  Somewhat difficult  Not difficult at all Somewhat difficult     Review of Systems:   Pertinent items are noted in HPI Denies fever/chills, dizziness, headaches, visual disturbances, fatigue, shortness of breath, chest pain, abdominal pain, vomiting, no problems with periods, bowel movements, urination, or intercourse unless otherwise stated above.  Pertinent History Reviewed:  Reviewed past medical,surgical, social, obstetrical and family history.  Reviewed problem list, medications and allergies. Past Surgical History:  Procedure Laterality Date   ANGIOPLASTY  2012   ? cerebral artery    CHOLECYSTECTOMY     SHUNT REVISION     4 times, last time 07/24/19   SLEEVE  GASTROPLASTY  05/2014   Dr. Evorn Gong    Stent placed  August 2014   Stenosis right transverse sigmoid junction,[facility]   VENTRICULAR ATRIAL SHUNT     VP shunt  11/2013   [facility]    Physical Assessment:   Vitals:   09/24/22 1007 09/24/22 1019 09/24/22 1023  BP: (!) 135/91 (!) 141/88 133/81  Pulse: 89 88 81  Weight: 262 lb (118.8 kg)    Height: '5\' 8"'$  (1.727 m)    Body mass index is 39.84 kg/m.       Physical Examination:   General appearance: alert, well appearing, and in no distress  Psych: mood appropriate, normal affect  Skin: warm & dry   Cardiovascular: normal heart rate noted  Respiratory: normal respiratory effort, no distress  Abdomen: soft, non-tender   Pelvic: VULVA: right labia majora inner portion - raised pea-size area noted- top portion appears black in nature.  No erythema, no indurartion.   Non-tender. No other abnormalities noted.   Extremities: no edema   Chaperone:  Deanna Sola     Assessment & Plan:  1) Ingrown hair -discussed findings on exam, reassured pt benign -I&D not indicated -reviewed conservative management including warm compresses -f/u prn  2) elevated blood pressure -pt  did not yet take her medication this am  No orders of the defined types were placed in this encounter.   Return if symptoms worsen or fail to improve.   Janyth Pupa, DO Attending [place], [facility] for [employer], [place]

## 2022-10-08 ENCOUNTER — Other Ambulatory Visit: Payer: Self-pay | Admitting: Family Medicine

## 2022-10-08 DIAGNOSIS — R030 Elevated blood-pressure reading, without diagnosis of hypertension: Secondary | ICD-10-CM

## 2022-10-12 ENCOUNTER — Other Ambulatory Visit: Payer: Self-pay | Admitting: Family Medicine

## 2022-10-12 DIAGNOSIS — E282 Polycystic ovarian syndrome: Secondary | ICD-10-CM

## 2022-10-12 DIAGNOSIS — G932 Benign intracranial hypertension: Secondary | ICD-10-CM

## 2022-10-12 DIAGNOSIS — R7301 Impaired fasting glucose: Secondary | ICD-10-CM

## 2022-11-07 ENCOUNTER — Encounter: Payer: Self-pay | Admitting: Family Medicine

## 2022-11-07 DIAGNOSIS — R7301 Impaired fasting glucose: Secondary | ICD-10-CM

## 2022-11-07 MED ORDER — SEMAGLUTIDE (1 MG/DOSE) 4 MG/3ML ~~LOC~~ SOPN: 1.0000 mg | PEN_INJECTOR | SUBCUTANEOUS | 1 refills | Status: DC

## 2022-11-07 NOTE — Telephone Encounter (Signed)
Meds ordered this encounter  Medications   Semaglutide, 1 MG/DOSE, 4 MG/3ML SOPN    Sig: Inject 1 mg as directed once a week.    Dispense:  3 mL    Refill:  1   Pls make sure schedule f/u appt in end of march

## 2022-12-03 ENCOUNTER — Ambulatory Visit: Payer: Medicaid Other | Admitting: Family Medicine

## 2022-12-03 DIAGNOSIS — R7301 Impaired fasting glucose: Secondary | ICD-10-CM

## 2022-12-31 ENCOUNTER — Ambulatory Visit: Payer: Medicaid Other | Admitting: Family Medicine

## 2022-12-31 VITALS — BP 119/81 | HR 101 | Ht 68.0 in | Wt 256.0 lb

## 2022-12-31 DIAGNOSIS — R7301 Impaired fasting glucose: Secondary | ICD-10-CM

## 2022-12-31 DIAGNOSIS — I10 Essential (primary) hypertension: Secondary | ICD-10-CM | POA: Diagnosis not present

## 2022-12-31 DIAGNOSIS — Z7689 Persons encountering health services in other specified circumstances: Secondary | ICD-10-CM | POA: Diagnosis not present

## 2022-12-31 DIAGNOSIS — Z6838 Body mass index (BMI) 38.0-38.9, adult: Secondary | ICD-10-CM | POA: Diagnosis not present

## 2022-12-31 LAB — POCT GLYCOSYLATED HEMOGLOBIN (HGB A1C): Hemoglobin A1C: 5.2 % (ref 4.0–5.6)

## 2022-12-31 MED ORDER — SEMAGLUTIDE (1 MG/DOSE) 4 MG/3ML ~~LOC~~ SOPN: 1.0000 mg | PEN_INJECTOR | SUBCUTANEOUS | 1 refills | Status: DC

## 2022-12-31 NOTE — Assessment & Plan Note (Signed)
Well controlled. Continue current regimen. Follow up in  6 mo  

## 2022-12-31 NOTE — Progress Notes (Signed)
Established Patient Office Visit  Subjective   Patient ID: Maria Payne, female    DOB: 02/27/1991  Age: 32 y.o. MRN: [id number]  Chief Complaint  Patient presents with   Weight Check    HPI Hypertension- Pt denies chest pain, SOB, dizziness, or heart palpitations.  Taking meds as directed w/o problems.  Denies medication side effects.    Impaired fasting glucose-no increased thirst or urination. No symptoms consistent with hypoglycemia.     ROS    Objective:     BP 119/81   Pulse (!) 101   Ht  (1.727 m)   Wt 256 lb (116.1 kg)   SpO2 96%   BMI 38.92 kg/m    Physical Exam Vitals and nursing note reviewed.  Constitutional:      Appearance: She is well-developed.  HENT:     Head: Normocephalic and atraumatic.  Cardiovascular:     Rate and Rhythm: Normal rate and regular rhythm.     Heart sounds: Normal heart sounds.  Pulmonary:     Effort: Pulmonary effort is normal.     Breath sounds: Normal breath sounds.  Skin:    General: Skin is warm and dry.  Neurological:     Mental Status: She is alert and oriented to person, place, and time.  Psychiatric:        Behavior: Behavior normal.      Results for orders placed or performed in visit on 12/31/22  POCT glycosylated hemoglobin (Hb A1C)  Result Value Ref Range   Hemoglobin A1C 5.2 4.0 - 5.6 %   HbA1c POC (<> result, manual entry)     HbA1c, POC (prediabetic range)     HbA1c, POC (controlled diabetic range)        The ASCVD Risk score (Arnett DK, et al., 2019) failed to calculate for the following reasons:   The 2019 ASCVD risk score is only valid for ages 4 to 64    Assessment & Plan:   Problem List Items Addressed This Visit       Cardiovascular and Mediastinum   Hypertension    Well controlled. Continue current regimen. Follow up in  23mo       Relevant Orders   COMPLETE METABOLIC PANEL WITH GFR   Lipid Panel w/reflex Direct LDL   CBC with Differential/Platelet     Endocrine    IFG (impaired fasting glucose) - Primary      Monitor for increase in symptoms particularly nausea after eating.  Continue to work on portion control and continue to work on [employer].  Also work on increasing activity and exercise level.  Plan to follow-up in about 6 to 8 weeks.   Visit #: 3 Starting Weight: 261 lbs   Current weight: 256 lbs Previous weight: 260 lbs Change in weight: down 4 lb  Goal weight: 200 lbs Dietary goals:increased veggies and protein.  Exercise goals: Medication: Ozempic . . Consider In to  in one month.   Follow-up and referrals: 6-8 weeks.       Relevant Medications   Semaglutide, 1 MG/DOSE, 4 MG/3ML SOPN   Other Relevant Orders   POCT glycosylated hemoglobin (Hb A1C) (Completed)   COMPLETE METABOLIC PANEL WITH GFR   Lipid Panel w/reflex Direct LDL   CBC with Differential/Platelet     Other   Encounter for weight management   Relevant Medications   Semaglutide, 1 MG/DOSE, 4 MG/3ML SOPN   Other Relevant Orders   COMPLETE METABOLIC PANEL WITH  GFR   Lipid Panel w/reflex Direct LDL   CBC with Differential/Platelet   BMI 38.0-38.9,adult    Return in about 2 months (around 03/02/2023) for Pre-diabetes, Ozempic .    Nani Gasser, MD

## 2022-12-31 NOTE — Assessment & Plan Note (Addendum)
Monitor for increase in symptoms particularly nausea after eating.  Continue to work on portion control and continue to work on [employer].  Also work on increasing activity and exercise level.  Plan to follow-up in about 6 to 8 weeks.   Visit #: 3 Starting Weight: 261 lbs   Current weight: 256 lbs Previous weight: 260 lbs Change in weight: down 4 lb  Goal weight: 200 lbs Dietary goals:increased veggies and protein.  Exercise goals: Medication: Ozempic . . Consider In to  in one month.   Follow-up and referrals: 6-8 weeks.

## 2023-01-01 LAB — COMPLETE METABOLIC PANEL WITH GFR
AG Ratio: 1.8 (calc) (ref 1.0–2.5)
ALT: 17 U/L (ref 6–29)
AST: 15 U/L (ref 10–30)
Albumin: 4.4 g/dL (ref 3.6–5.1)
Alkaline phosphatase (APISO): 52 U/L (ref 31–125)
BUN: 14 mg/dL (ref 7–25)
CO2: 27 mmol/L (ref 20–32)
Calcium: 9.2 mg/dL (ref 8.6–10.2)
Chloride: 107 mmol/L (ref 98–110)
Creat: 0.72 mg/dL (ref 0.50–0.97)
Globulin: 2.5 g/dL (calc) (ref 1.9–3.7)
Glucose, Bld: 96 mg/dL (ref 65–99)
Potassium: 4.3 mmol/L (ref 3.5–5.3)
Sodium: 142 mmol/L (ref 135–146)
Total Bilirubin: 0.4 mg/dL (ref 0.2–1.2)
Total Protein: 6.9 g/dL (ref 6.1–8.1)
eGFR: 114 mL/min/{1.73_m2} (ref >=60)

## 2023-01-01 LAB — LIPID PANEL W/REFLEX DIRECT LDL
Cholesterol: 201 mg/dL — ABNORMAL HIGH (ref <200)
HDL: 47 mg/dL — ABNORMAL LOW (ref >=50)
LDL Cholesterol (Calc): 134 mg/dL (calc) — ABNORMAL HIGH
Non-HDL Cholesterol (Calc): 154 mg/dL (calc) — ABNORMAL HIGH (ref <130)
Total CHOL/HDL Ratio: 4.3 (calc) (ref <5.0)
Triglycerides: 95 mg/dL (ref <150)

## 2023-01-01 LAB — CBC WITH DIFFERENTIAL/PLATELET
Absolute Monocytes: 238 cells/uL (ref 200–950)
Basophils Absolute: 32 cells/uL (ref 0–200)
Basophils Relative: 0.6 %
Eosinophils Absolute: 81 cells/uL (ref 15–500)
Eosinophils Relative: 1.5 %
HCT: 40.4 % (ref 35.0–45.0)
Hemoglobin: 13.1 g/dL (ref 11.7–15.5)
Lymphs Abs: 1501 cells/uL (ref 850–3900)
MCH: 27.8 pg (ref 27.0–33.0)
MCHC: 32.4 g/dL (ref 32.0–36.0)
MCV: 85.8 fL (ref 80.0–100.0)
MPV: 9.7 fL (ref 7.5–12.5)
Monocytes Relative: 4.4 %
Neutro Abs: 3548 cells/uL (ref 1500–7800)
Neutrophils Relative %: 65.7 %
Platelets: 318 10*3/uL (ref 140–400)
RBC: 4.71 10*6/uL (ref 3.80–5.10)
RDW: 12.8 % (ref 11.0–15.0)
Total Lymphocyte: 27.8 %
WBC: 5.4 10*3/uL (ref 3.8–10.8)

## 2023-01-01 NOTE — Progress Notes (Signed)
Hi Maria Payne, LDL cholesterol did trend upward compared to last year.  Continue work on [employer] and regular exercise.  Metabolic panel and blood count are normal.  No anemia.

## 2023-01-24 ENCOUNTER — Telehealth: Payer: Medicaid Other | Admitting: Physician Assistant

## 2023-01-24 DIAGNOSIS — N760 Acute vaginitis: Secondary | ICD-10-CM

## 2023-01-24 DIAGNOSIS — N3000 Acute cystitis without hematuria: Secondary | ICD-10-CM

## 2023-01-24 MED ORDER — NITROFURANTOIN MONOHYD MACRO 100 MG PO CAPS: 100.0000 mg | ORAL_CAPSULE | Freq: Two times a day (BID) | ORAL | 0 refills | Status: AC

## 2023-01-24 MED ORDER — FLUCONAZOLE 150 MG PO TABS: 150.0000 mg | ORAL_TABLET | Freq: Every day | ORAL | 0 refills | Status: AC

## 2023-01-24 NOTE — Progress Notes (Signed)

## 2023-02-22 ENCOUNTER — Telehealth: Payer: Medicaid Other | Admitting: Family Medicine

## 2023-02-22 ENCOUNTER — Other Ambulatory Visit: Payer: Self-pay | Admitting: Family Medicine

## 2023-02-22 DIAGNOSIS — J069 Acute upper respiratory infection, unspecified: Secondary | ICD-10-CM | POA: Diagnosis not present

## 2023-02-22 DIAGNOSIS — R7301 Impaired fasting glucose: Secondary | ICD-10-CM

## 2023-02-22 DIAGNOSIS — Z7689 Persons encountering health services in other specified circumstances: Secondary | ICD-10-CM

## 2023-02-22 MED ORDER — BENZONATATE 200 MG PO CAPS: 200.0000 mg | ORAL_CAPSULE | Freq: Two times a day (BID) | ORAL | 0 refills | Status: DC | PRN

## 2023-02-22 MED ORDER — FLUTICASONE PROPIONATE 50 MCG/ACT NA SUSP: 2.0000 | Freq: Every day | NASAL | 6 refills | Status: AC

## 2023-02-22 NOTE — Progress Notes (Signed)
See other visit- checked in twice-DWB

## 2023-02-22 NOTE — Progress Notes (Signed)

## 2023-02-22 NOTE — Progress Notes (Signed)
No charge= already addressed at last visit. DWB

## 2023-02-22 NOTE — Progress Notes (Signed)
E-Visit for Upper Respiratory Infection   We are sorry you are not feeling well.  Here is how we plan to help!  Based on what you have shared with me, it looks like you may have a viral upper respiratory infection.  Upper respiratory infections are caused by a large number of viruses; however, rhinovirus is the most common cause.   Symptoms vary from person to person, with common symptoms including sore throat, cough, fatigue or lack of energy and feeling of general discomfort.  A low-grade fever of up to 100.4 may present, but is often uncommon.  Symptoms vary however, and are closely related to a person's age or underlying illnesses.  The most common symptoms associated with an upper respiratory infection are nasal discharge or congestion, cough, sneezing, headache and pressure in the ears and face.  These symptoms usually persist for about 3 to 10 days, but can last up to 2 weeks.  It is important to know that upper respiratory infections do not cause serious illness or complications in most cases.    Upper respiratory infections can be transmitted from person to person, with the most common method of transmission being a person's hands.  The virus is able to live on the skin and can infect other persons for up to 2 hours after direct contact.  Also, these can be transmitted when someone coughs or sneezes; thus, it is important to cover the mouth to reduce this risk.  To keep the spread of the illness at bay, good hand hygiene is very important.  This is an infection that is most likely caused by a virus. There are no specific treatments other than to help you with the symptoms until the infection runs its course.  We are sorry you are not feeling well.  Here is how we plan to help!   For nasal congestion, you may use an oral decongestants such as Mucinex D or if you have glaucoma or high blood pressure use plain Mucinex.  Saline nasal spray or nasal drops can help and can safely be used as often as  needed for congestion.  For your congestion, I have prescribed Fluticasone nasal spray one spray in each nostril twice a day  If you do not have a history of heart disease, hypertension, diabetes or thyroid disease, prostate/bladder issues or glaucoma, you may also use Sudafed to treat nasal congestion.  It is highly recommended that you consult with a pharmacist or your primary care physician to ensure this medication is safe for you to take.     If you have a cough, you may use cough suppressants such as Delsym and Robitussin.  If you have glaucoma or high blood pressure, you can also use Coricidin HBP.   For cough I have prescribed for you A prescription cough medication called Tessalon Perles 100 mg. You may take 1-2 capsules every 8 hours as needed for cough  If you have a sore or scratchy throat, use a saltwater gargle-  to  teaspoon of salt dissolved in a 4-ounce to 8-ounce glass of warm water.  Gargle the solution for approximately 15-30 seconds and then spit.  It is important not to swallow the solution.  You can also use throat lozenges/cough drops and Chloraseptic spray to help with throat pain or discomfort.  Warm or cold liquids can also be helpful in relieving throat pain.  For headache, pain or general discomfort, you can use Ibuprofen or Tylenol as directed.   Some authorities believe   that zinc sprays or the use of Echinacea may shorten the course of your symptoms.   HOME CARE Only take medications as instructed by your medical team. Be sure to drink plenty of fluids. Water is fine as well as fruit juices, sodas and electrolyte beverages. You may want to stay away from caffeine or alcohol. If you are nauseated, try taking small sips of liquids. How do you know if you are getting enough fluid? Your urine should be a pale yellow or almost colorless. Get rest. Taking a steamy shower or using a humidifier may help nasal congestion and ease sore throat pain. You can place a towel over  your head and breathe in the steam from hot water coming from a faucet. Using a saline nasal spray works much the same way. Cough drops, hard candies and sore throat lozenges may ease your cough. Avoid close contacts especially the very young and the elderly Cover your mouth if you cough or sneeze Always remember to wash your hands.   GET HELP RIGHT AWAY IF: You develop worsening fever. If your symptoms do not improve within 10 days You develop yellow or green discharge from your nose over 3 days. You have coughing fits You develop a severe head ache or visual changes. You develop shortness of breath, difficulty breathing or start having chest pain Your symptoms persist after you have completed your treatment plan  MAKE SURE YOU  Understand these instructions. Will watch your condition. Will get help right away if you are not doing well or get worse.  Thank you for choosing an e-visit.  Your e-visit answers were reviewed by a board certified advanced clinical practitioner to complete your personal care plan. Depending upon the condition, your plan could have included both over the counter or prescription medications.  Please review your pharmacy choice. Make sure the pharmacy is open so you can pick up prescription now. If there is a problem, you may contact your provider through MyChart messaging and have the prescription routed to another pharmacy.  Your safety is important to us. If you have drug allergies check your prescription carefully.   For the next 24 hours you can use MyChart to ask questions about today's visit, request a non-urgent call back, or ask for a work or school excuse. You will get an email in the next two days asking about your experience. I hope that your e-visit has been valuable and will speed your recovery.   have provided 5 minutes of non face to face time during this encounter for chart review and documentation.     

## 2023-03-12 ENCOUNTER — Ambulatory Visit: Payer: Medicaid Other | Admitting: Family Medicine

## 2023-03-25 ENCOUNTER — Encounter: Payer: Self-pay | Admitting: Emergency Medicine

## 2023-03-25 ENCOUNTER — Ambulatory Visit
Admission: EM | Admit: 2023-03-25 | Discharge: 2023-03-25 | Disposition: A | Discharge status: Home / Self Care | Payer: Medicaid Other | Attending: Family Medicine | Admitting: Family Medicine

## 2023-03-25 DIAGNOSIS — M549 Dorsalgia, unspecified: Secondary | ICD-10-CM

## 2023-03-25 DIAGNOSIS — S39012A Strain of muscle, fascia and tendon of lower back, initial encounter: Secondary | ICD-10-CM

## 2023-03-25 MED ORDER — METHYLPREDNISOLONE 4 MG PO TBPK: ORAL_TABLET | ORAL | 0 refills | Status: DC

## 2023-03-25 MED ORDER — TIZANIDINE HCL 4 MG PO TABS: 4.0000 mg | ORAL_TABLET | Freq: Four times a day (QID) | ORAL | 0 refills | Status: DC | PRN

## 2023-03-25 NOTE — ED Triage Notes (Signed)
Patient states she began having some back pain on Friday.  Saturday she was vacuuming and bent down to pick up something and been having pain since.  Pain is located more on the right side.  Patient has taken Tylenol.

## 2023-03-25 NOTE — Discharge Instructions (Signed)
Take the dosepak as directed Take all of day one today Take the tizanidine as needed muscle relaxer This is helpful at night Ice or heat to area Limit activity while painful See your doctor if not improving in a week

## 2023-03-25 NOTE — ED Provider Notes (Signed)
[facility]    CSN: [id number] Arrival date & time: 03/25/23  1735      History   Chief Complaint Chief Complaint  Patient presents with   Back Pain    HPI Maria Payne is a 32 y.o. female.   HPI  Pleasant 32 year old female.  States she periodically has back pain.  She states that she pulled something in her back while she was vacuuming on Friday.  She states that it is still painful.  It is in the right side of her middle back and she points to the mid lumbar region.  No radiation.  Worse with activity.  Better with rest.  Increased with deep breath.  Patient has a history of pseudotumor cerebri.  Has had a couple of shunt procedures.  Currently is not having any difficulty with that.  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 Had diagnostic cerebral arteriogram.      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 cerebral artery    CHOLECYSTECTOMY     SHUNT REVISION     4 times, last time 07/24/19   SLEEVE GASTROPLASTY  05/2014   Dr. Adolphus Birchwood    Stent placed  August 2014   Stenosis right transverse sigmoid junction,[facility]   VENTRICULAR ATRIAL SHUNT     VP shunt  11/2013   Done at [facility]    OB History     Gravida  1   Para  1   Term  0   Preterm  1   AB  0   Living  1      SAB  0   IAB  0   Ectopic  0   Multiple  0   Live Births  1            Home Medications    Prior to Admission medications   Medication Sig Start Date End Date Taking? Authorizing Provider  albuterol (VENTOLIN HFA) 108 (90 Base) MCG/ACT inhaler Inhale 2 puffs into the lungs every 4 (four) hours as needed for wheezing. 09/26/20  Yes Breeback, Jade L, PA-C  aspirin 81 MG chewable tablet Chew by mouth daily.   Yes [provider]  fluticasone (FLONASE) 50 MCG/ACT nasal spray Place 2 sprays into both nostrils daily. 02/22/23  Yes Delorse Lek, FNP  losartan (COZAAR) 25 MG tablet TAKE 1 TABLET (25 MG TOTAL) BY MOUTH DAILY.  10/08/22  Yes Agapito Games, MD  methylPREDNISolone (MEDROL DOSEPAK) 4 MG TBPK tablet tad 03/25/23  Yes Eustace Moore, MD  Semaglutide, 1 MG/DOSE, (OZEMPIC, 1 MG/DOSE,) 4 MG/3ML SOPN INJECT 1 MG ONCE A WEEK AS DIRECTED 02/25/23  Yes Breeback, Jade L, PA-C  tiZANidine (ZANAFLEX) 4 MG tablet Take 1-2 tablets (4-8 mg total) by mouth every 6 (six) hours as needed for muscle spasms. 03/25/23  Yes Eustace Moore, MD  topiramate (TOPAMAX) 25 MG tablet Take 25 mg by mouth daily. Pt report she taking two tablets daily 08/13/22  Yes [provider]    Family History Family History  Problem Relation Age of Onset   Diabetes Mother    Hypertension Father    Heart disease Father    Diabetes Father    Hyperlipidemia Sister    Endometriosis Sister    Thyroid disease Sister     Colon cancer Neg Hx    Stomach cancer Neg Hx    Pancreatic cancer Neg Hx    Esophageal cancer Neg Hx    Rectal cancer Neg Hx     Social History Social History   Tobacco Use   Smoking status: Never   Smokeless tobacco: Never  Vaping Use   Vaping status: Never Used  Substance Use Topics   Alcohol use: Not Currently   Drug use: No     Allergies   Latex, Nsaids, Codeine, Hydrocodone-acetaminophen, Oxycodone, and Tape   Review of Systems Review of Systems See HPI  Physical Exam Triage Vital Signs ED Triage Vitals  Encounter Vitals Group     BP 03/25/23 1751 130/88     Systolic BP Percentile --      Diastolic BP Percentile --      Pulse Rate 03/25/23 1751 93     Resp 03/25/23 1751 18     Temp 03/25/23 1751 98.3 F (36.8 C)     Temp Source 03/25/23 1751 Oral     SpO2 03/25/23 1751 97 %     Weight 03/25/23 1752 230 lb (104.3 kg)     Height 03/25/23 1752 5\' 8"  (1.727 m)     Head Circumference --      Peak Flow --      Pain Score 03/25/23 1752 5     Pain Loc --      Pain Education --      Exclude from Growth Chart --    No data found.  Updated Vital Signs BP 130/88 (BP Location: Left Arm)   Pulse 93   Temp 98.3 F (36.8 C) (Oral)   Resp 18   Ht 5\' 8"  (1.727 m)   Wt 104.3 kg   LMP 03/11/2023   SpO2 97%   BMI 34.97 kg/m        Physical Exam Constitutional:      General: She is not in acute distress.    Appearance: She is well-developed. She is obese.     Comments: Guarded movements  HENT:     Head: Normocephalic and atraumatic.  Eyes:     Conjunctiva/sclera: Conjunctivae normal.     Pupils: Pupils are equal, round, and reactive to light.  Cardiovascular:     Rate and Rhythm: Normal rate.  Pulmonary:     Effort: Pulmonary effort is normal. No respiratory distress.  Abdominal:     General: There is no distension.     Palpations: Abdomen is soft.  Musculoskeletal:        General: Normal range of  motion.     Cervical back: Normal range of motion.      Comments: Tight and tender muscles palpable on the left lumbar column of muscles.  Limited range of motion.  No neuro deficits or straight leg raise  Skin:    General: Skin is warm and dry.  Neurological:     Mental Status: She is alert.      UC Treatments / Results  Labs (all labs ordered are listed, but only abnormal results are displayed) Labs Reviewed - No data to display  EKG   Radiology No results found.  Procedures Procedures (including critical care time)  Medications Ordered in UC Medications - No data to display  Initial Impression / Assessment and Plan / UC Course  I have reviewed the triage vital signs and the nursing notes.  Pertinent labs & imaging results that were available during my care of the patient were reviewed by me and considered in my medical decision making (see chart for details).    Final Clinical Impressions(s) / UC Diagnoses   Final diagnoses:  Musculoskeletal back pain  Strain of lumbar region, initial encounter     Discharge Instructions      Take the dosepak as directed Take all of day one today Take the tizanidine as needed muscle relaxer This is helpful at night Ice or heat to area Limit activity while painful See your doctor if not improving in a week    ED Prescriptions     Medication Sig Dispense Auth. Provider   methylPREDNISolone (MEDROL DOSEPAK) 4 MG TBPK tablet tad 21 tablet Eustace Moore, MD   tiZANidine (ZANAFLEX) 4 MG tablet Take 1-2 tablets (4-8 mg total) by mouth every 6 (six) hours as needed for muscle spasms. 21 tablet Eustace Moore, MD      I have reviewed the PDMP during this encounter.   Eustace Moore, MD 03/25/23 [phone]

## 2023-04-03 ENCOUNTER — Telehealth: Payer: Self-pay | Admitting: Family Medicine

## 2023-04-03 NOTE — Telephone Encounter (Signed)
OK for virtual.

## 2023-04-03 NOTE — Telephone Encounter (Signed)
Patient is requesting a virtual appt due to work schedule she had an appt on July 2nd and cancelled she is out of her Ozempic 4mg  also

## 2023-04-04 ENCOUNTER — Encounter: Payer: Self-pay | Admitting: Family Medicine

## 2023-04-04 ENCOUNTER — Telehealth (INDEPENDENT_AMBULATORY_CARE_PROVIDER_SITE_OTHER): Payer: Medicaid Other | Admitting: Family Medicine

## 2023-04-04 ENCOUNTER — Other Ambulatory Visit: Payer: Self-pay | Admitting: Physician Assistant

## 2023-04-04 VITALS — BP 127/79 | HR 112 | Ht 68.0 in | Wt 250.0 lb

## 2023-04-04 DIAGNOSIS — E282 Polycystic ovarian syndrome: Secondary | ICD-10-CM | POA: Diagnosis not present

## 2023-04-04 DIAGNOSIS — R7301 Impaired fasting glucose: Secondary | ICD-10-CM

## 2023-04-04 DIAGNOSIS — Z6838 Body mass index (BMI) 38.0-38.9, adult: Secondary | ICD-10-CM

## 2023-04-04 DIAGNOSIS — Z7689 Persons encountering health services in other specified circumstances: Secondary | ICD-10-CM

## 2023-04-04 DIAGNOSIS — I10 Essential (primary) hypertension: Secondary | ICD-10-CM | POA: Diagnosis not present

## 2023-04-04 MED ORDER — LOSARTAN POTASSIUM 25 MG PO TABS: 25.0000 mg | ORAL_TABLET | Freq: Every day | ORAL | 1 refills | Status: DC

## 2023-04-04 MED ORDER — SEMAGLUTIDE (2 MG/DOSE) 8 MG/3ML ~~LOC~~ SOPN
2.0000 mg | PEN_INJECTOR | SUBCUTANEOUS | 1 refills | Status: DC
Start: 2023-04-04 — End: 2023-07-03

## 2023-04-04 NOTE — Assessment & Plan Note (Signed)
See note uder IFG

## 2023-04-04 NOTE — Assessment & Plan Note (Signed)
Home BP at goal. Needs refills. F/U in 6 months.

## 2023-04-04 NOTE — Progress Notes (Addendum)
Virtual Visit via Video Note  I connected with Maria Payne on 04/05/23 at  1:00 PM EDT by a video enabled telemedicine application and verified that I am speaking with the correct person using two identifiers.   I discussed the limitations of evaluation and management by telemedicine and the availability of in person appointments. The patient expressed understanding and agreed to proceed.  Patient location: at home Provider location: in office   Subjective   Patient ID: Maria Payne, female    DOB: [birth year]  Age: 32 y.o. MRN: [id number]  Chief Complaint  Patient presents with   Medication Refill    Ozempic doesn't know if you are changing dosage    HPI Impaired fasting glucose-no increased thirst or urination. No symptoms consistent with hypoglycemia.  Currently on Ozempic 1 mg and doing well.  Also has a history of PCOS.  Has been trying to walk at least 4 days a week sometimes while her kids at practice and sometimes just at home.  She is really been trying to stick to lean proteins and increased vegetables.  She eats a vegetable at every meal.  She is doing low-carb.  She was hoping to lose weight a little bit more quickly than she has.  Hypertension- Pt denies chest pain, SOB, dizziness, or heart palpitations.  Taking meds as directed w/o problems.  Denies medication side effects.       ROS    Objective:     BP 127/79 (BP Location: Left Arm, Patient Position: Sitting, Cuff Size: Normal)   Pulse (!) 112   Ht 5\' 8"  (1.727 m)   Wt 250 lb (113.4 kg)   LMP 03/11/2023   BMI 38.01 kg/m    Physical Exam Vitals and nursing note reviewed.  Constitutional:      Appearance: She is well-developed.  HENT:     Head: Normocephalic and atraumatic.  Cardiovascular:     Rate and Rhythm: Normal rate and regular rhythm.     Heart sounds: Normal heart sounds.  Pulmonary:     Effort: Pulmonary effort is normal.     Breath sounds: Normal breath sounds.  Skin:    General:  Skin is warm and dry.  Neurological:     Mental Status: She is alert and oriented to person, place, and time.  Psychiatric:        Behavior: Behavior normal.      No results found for any visits on 04/04/23.    The ASCVD Risk score (Arnett DK, et al., 2019) failed to calculate for the following reasons:   The 2019 ASCVD risk score is only valid for ages 76 to 61    Assessment & Plan:   Problem List Items Addressed This Visit       Cardiovascular and Mediastinum   Hypertension    Home BP at goal. Needs refills. F/U in 6 months.        Relevant Medications   losartan (COZAAR) 25 MG tablet     Endocrine   PCOS (polycystic ovarian syndrome) - Primary    See note uder IFG      Relevant Medications   Semaglutide, 2 MG/DOSE, 8 MG/3ML SOPN   IFG (impaired fasting glucose)    Monitor for increase in symptoms particularly nausea after eating.  Continue to work on portion control and continue to work on [employer].  Also work on increasing activity and exercise level.  Plan to follow-up in about 6 to 8 weeks.  Visit #: 4 Starting Weight: 261 lbs   Current weight:  250 lbs  Previous weight: 256 lbs Change in weight: down 6 lbs  Goal weight: 200 lbs Dietary goals:increased veggies and protein. Cut out soda. Eating low carb. Not currently calorie counting  Exercise goals: Medication: Increase to 2mg  I.  Follow-up and referrals: 8 weeks.       Relevant Medications   Semaglutide, 2 MG/DOSE, 8 MG/3ML SOPN     Other   BMI 38.0-38.9,adult   Relevant Medications   Semaglutide, 2 MG/DOSE, 8 MG/3ML SOPN    No follow-ups on file.   I spent 20 minutes on the day of the encounter to include pre-visit record review, face-to-face time with the patient and post visit ordering of test.  I discussed the assessment and treatment plan with the patient. The patient was provided an opportunity to ask questions and all were answered. The patient agreed with the plan and  demonstrated an understanding of the instructions.   The patient was advised to call back or seek an in-person evaluation if the symptoms worsen or if the condition fails to improve as anticipated.   Nani Gasser, MD  Nani Gasser, MD

## 2023-04-04 NOTE — Assessment & Plan Note (Deleted)
Visit #:2 Starting Weight:  Current weight: Previous weight: 230 lbs  Change in weight: Goal weight: Dietary goals: Exercise goals: Medication: Follow-up and referrals:

## 2023-04-04 NOTE — Assessment & Plan Note (Addendum)
Monitor for increase in symptoms particularly nausea after eating.  Continue to work on portion control and continue to work on [employer].  Also work on increasing activity and exercise level.  Plan to follow-up in about 6 to 8 weeks.   Visit #: 4 Starting Weight: 261 lbs   Current weight:  250 lbs  Previous weight: 256 lbs Change in weight: down 6 lbs  Goal weight: 200 lbs Dietary goals:increased veggies and protein. Cut out soda. Eating low carb. Not currently calorie counting  Exercise goals: Medication: Increase to 2mg  I.  Follow-up and referrals: 8 weeks.

## 2023-04-10 ENCOUNTER — Other Ambulatory Visit: Payer: Self-pay | Admitting: Physician Assistant

## 2023-04-10 DIAGNOSIS — Z7689 Persons encountering health services in other specified circumstances: Secondary | ICD-10-CM

## 2023-04-10 DIAGNOSIS — R7301 Impaired fasting glucose: Secondary | ICD-10-CM

## 2023-06-27 ENCOUNTER — Encounter: Payer: Self-pay | Admitting: Family Medicine

## 2023-06-27 ENCOUNTER — Telehealth: Payer: Self-pay | Admitting: Family Medicine

## 2023-06-27 NOTE — Telephone Encounter (Signed)
Patient called she is out of her Semaglutide 2mg  she is scheduled for 07-11-23  Pharmacy [facility] [address] [place] [address] Phone: [phone]

## 2023-07-03 ENCOUNTER — Other Ambulatory Visit: Payer: Self-pay | Admitting: *Deleted

## 2023-07-03 DIAGNOSIS — E282 Polycystic ovarian syndrome: Secondary | ICD-10-CM

## 2023-07-03 DIAGNOSIS — R7301 Impaired fasting glucose: Secondary | ICD-10-CM

## 2023-07-03 DIAGNOSIS — Z6838 Body mass index (BMI) 38.0-38.9, adult: Secondary | ICD-10-CM

## 2023-07-03 MED ORDER — SEMAGLUTIDE (2 MG/DOSE) 8 MG/3ML ~~LOC~~ SOPN
2.0000 mg | PEN_INJECTOR | SUBCUTANEOUS | 1 refills | Status: DC
Start: 2023-07-03 — End: 2023-12-17

## 2023-07-03 NOTE — Telephone Encounter (Signed)
Medication sent.

## 2023-07-11 ENCOUNTER — Telehealth: Payer: Self-pay

## 2023-07-11 ENCOUNTER — Ambulatory Visit: Payer: No Typology Code available for payment source | Admitting: Family Medicine

## 2023-07-11 ENCOUNTER — Encounter: Payer: Self-pay | Admitting: Family Medicine

## 2023-07-11 VITALS — BP 133/87 | HR 91 | Ht 68.0 in | Wt 253.0 lb

## 2023-07-11 DIAGNOSIS — R7301 Impaired fasting glucose: Secondary | ICD-10-CM | POA: Diagnosis not present

## 2023-07-11 DIAGNOSIS — I10 Essential (primary) hypertension: Secondary | ICD-10-CM | POA: Diagnosis not present

## 2023-07-11 DIAGNOSIS — Z23 Encounter for immunization: Secondary | ICD-10-CM | POA: Diagnosis not present

## 2023-07-11 LAB — POCT GLYCOSYLATED HEMOGLOBIN (HGB A1C): Hemoglobin A1C: 5.1 % (ref 4.0–5.6)

## 2023-07-11 NOTE — Telephone Encounter (Addendum)
Initiated Prior authorization NWG:NFAOZHY 0.25-0.5 MG/DOSE PEN Via: [email or web address] Case/Key:[id number] Status: Pending denial as of 07/11/23 Reason:Lack of medical criteria Notified Pt via: Mychart  Chart notes and labs have been faxed to [phone] Prior Auth (EOC) ID: [id number] Drug/Service Name: OZEMPIC 0.25-0.5 MG/DOSE PEN Patient: Maria Payne Date Requested: 07/11/2023 9:10:10 AM   MemberID: [id number] DOB: 1990-12-01

## 2023-07-11 NOTE — Progress Notes (Signed)
Established Patient Office Visit  Subjective   Patient ID: Maria Payne, female    DOB: [birth year]  Age: 32 y.o. MRN: [id number]  Chief Complaint  Patient presents with   Hypertension   ifg    HPI  Impaired fasting glucose-no increased thirst or urination. No symptoms consistent with hypoglycemia.  She has been out of the semaglutide for about 2 weeks she switched insurance plans they were trying to get it authorized with her new insurance plan it has been submitted but we have not gotten full authorization yet.  Still trying to work on portion control and staying active she did get the bands and has started to use those for some strength training.  Hypertension- Pt denies chest pain, SOB, dizziness, or heart palpitations.  Taking meds as directed w/o problems.  Denies medication side effects.       ROS    Objective:     BP 133/87   Pulse 91   Ht 5\' 8"  (1.727 m)   Wt 253 lb (114.8 kg)   SpO2 100%   BMI 38.47 kg/m    Physical Exam Vitals and nursing note reviewed.  Constitutional:      Appearance: Normal appearance.  HENT:     Head: Normocephalic and atraumatic.  Eyes:     Conjunctiva/sclera: Conjunctivae normal.  Cardiovascular:     Rate and Rhythm: Normal rate and regular rhythm.  Pulmonary:     Effort: Pulmonary effort is normal.     Breath sounds: Normal breath sounds.  Skin:    General: Skin is warm and dry.  Neurological:     Mental Status: She is alert.  Psychiatric:        Mood and Affect: Mood normal.     Results for orders placed or performed in visit on 07/11/23  POCT HgB A1C  Result Value Ref Range   Hemoglobin A1C 5.1 4.0 - 5.6 %   HbA1c POC (<> result, manual entry)     HbA1c, POC (prediabetic range)     HbA1c, POC (controlled diabetic range)        The ASCVD Risk score (Arnett DK, et al., 2019) failed to calculate for the following reasons:   The 2019 ASCVD risk score is only valid for ages 35 to 90    Assessment & Plan:    Problem List Items Addressed This Visit       Cardiovascular and Mediastinum   Hypertension - Primary    Well controlled. Continue current regimen. Follow up in  6 mo         Endocrine   IFG (impaired fasting glucose)      Visit #: 5 Starting Weight: 261 lbs   Current weight:  250 lbs  Previous weight: 256 lbs Change in weight: down 6 lbs  Goal weight: 200 lbs Dietary goals:increased veggies and protein. Cut out soda. Eating low carb. Not currently calorie counting  Exercise goals: Medication: Increase to 2mg  I.  Follow-up and referrals: 8 weeks.      Relevant Orders   POCT HgB A1C (Completed)   Other Visit Diagnoses     Encounter for immunization       Relevant Orders   Flu vaccine trivalent PF, 6mos and older(Flulaval,Afluria,Fluarix,Fluzone) (Completed)       Return in about 6 months (around 01/08/2024).    Nani Gasser, MD

## 2023-07-11 NOTE — Assessment & Plan Note (Signed)
Well controlled. Continue current regimen. Follow up in  6 mo  

## 2023-07-11 NOTE — Assessment & Plan Note (Signed)
  Visit #: 5 Starting Weight: 261 lbs   Current weight:  250 lbs  Previous weight: 256 lbs Change in weight: down 6 lbs  Goal weight: 200 lbs Dietary goals:increased veggies and protein. Cut out soda. Eating low carb. Not currently calorie counting  Exercise goals: Medication: Increase to 2mg  I.  Follow-up and referrals: 8 weeks.

## 2023-07-17 ENCOUNTER — Encounter: Payer: Self-pay | Admitting: Family Medicine

## 2023-07-23 ENCOUNTER — Ambulatory Visit
Admission: EM | Admit: 2023-07-23 | Discharge: 2023-07-23 | Disposition: A | Discharge status: Home / Self Care | Payer: No Typology Code available for payment source | Attending: Family Medicine | Admitting: Family Medicine

## 2023-07-23 DIAGNOSIS — L03032 Cellulitis of left toe: Secondary | ICD-10-CM

## 2023-07-23 MED ORDER — DOXYCYCLINE HYCLATE 100 MG PO CAPS
100.0000 mg | ORAL_CAPSULE | Freq: Two times a day (BID) | ORAL | 0 refills | Status: AC
Start: 2023-07-23 — End: 2023-08-02

## 2023-07-23 NOTE — ED Triage Notes (Signed)
Pt presents to [facility] with co of infected ingrown toe nail on the left big toe for one week. Pt reports red, swollen, with purulent drainage for 1 week. Pt reports tyelnol at home and warm compresses.

## 2023-07-23 NOTE — ED Provider Notes (Signed)
[facility]    CSN: [id number] Arrival date & time: 07/23/23  1619      History   Chief Complaint Chief Complaint  Patient presents with   Nail Problem    HPI Maria Payne is a 32 y.o. female.   HPI pleasant 32 year old female presents with infected ingrown toenail on left great toe for 1 week.  PMH significant for cerebral venous sinus thrombosis, s/p ventriculoperitoneal shunt, and history of severe preeclampsia.  Past Medical History:  Diagnosis Date   Asthma    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   Admitted to [facility] on 08/21/11. Had diagnostic cerebral arteriogram.      Depression    GAD (generalized anxiety disorder) 04/04/2012   Papilledema    Diamox   Pseudotumor cerebri     Patient Active Problem List   Diagnosis Date Noted   Low libido 08/01/2022   IFG (impaired fasting glucose) 08/01/2022   Encounter for weight management 03/14/2021   PCOS (polycystic ovarian syndrome) 03/14/2021   Radiculitis of left cervical region 02/22/2021   History of preterm delivery 10/28/2019   History of severe pre-eclampsia 10/27/2019   Status post ventriculoatrial shunt placement 09/13/2019   Severe persistent asthma with exacerbation 09/09/2019   Hypertension 08/14/2019   BMI 38.0-38.9,adult 04/23/2019   S/P ventriculoperitoneal shunt 11/30/2016   Psychic factors associated with diseases classified elsewhere 12/31/2013   MDD (major depressive disorder) 04/04/2012   GAD (generalized anxiety disorder) 04/04/2012   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 cerebral artery    CHOLECYSTECTOMY     SHUNT REVISION     4 times, last time 07/24/19   SLEEVE GASTROPLASTY  05/2014   Dr. Adolphus Birchwood    Stent placed  August 2014   Stenosis right transverse  sigmoid junction,[facility]   VENTRICULAR ATRIAL SHUNT     VP shunt  11/2013   Done at [facility]    OB History     Gravida  1   Para  1   Term  0   Preterm  1   AB  0   Living  1      SAB  0   IAB  0   Ectopic  0   Multiple  0   Live Births  1            Home Medications    Prior to Admission medications   Medication Sig Start Date End Date Taking? Authorizing Provider  albuterol (VENTOLIN HFA) 108 (90 Base) MCG/ACT inhaler Inhale 2 puffs into the lungs every 4 (four) hours as needed for wheezing. 09/26/20   Jomarie Longs, PA-C  aspirin 81 MG chewable tablet Chew by mouth daily.    [provider]  doxycycline (VIBRAMYCIN) 100 MG capsule Take 1 capsule (100 mg total) by mouth 2 (two) times daily for 10 days. 07/23/23 08/02/23 Yes Trevor Iha, FNP  fluticasone (FLONASE) 50 MCG/ACT nasal spray Place 2 sprays into both nostrils daily. 02/22/23   Delorse Lek, FNP  losartan (COZAAR) 25 MG tablet Take 1 tablet (25 mg total) by mouth daily. 04/04/23   Agapito Games, MD  Semaglutide, 2 MG/DOSE, 8 MG/3ML SOPN Inject 2 mg as directed once a week. 07/03/23   Agapito Games, MD  topiramate (TOPAMAX) 25 MG tablet Take 25  mg by mouth daily. Pt report she taking two tablets daily 08/13/22   [provider]    Family History Family History  Problem Relation Age of Onset   Diabetes Mother    Hypertension Father    Heart disease Father    Diabetes Father    Hyperlipidemia Sister    Endometriosis Sister    Thyroid disease Sister    Colon cancer Neg Hx    Stomach cancer Neg Hx    Pancreatic cancer Neg Hx    Esophageal cancer Neg Hx    Rectal cancer Neg Hx     Social History Social History   Tobacco Use   Smoking status: Never   Smokeless tobacco: Never  Vaping Use   Vaping status: Never Used  Substance Use Topics   Alcohol use: Not Currently   Drug use: No     Allergies   Latex, Nsaids, Codeine, Hydrocodone-acetaminophen,  Oxycodone, and Tape   Review of Systems Review of Systems   Physical Exam Triage Vital Signs ED Triage Vitals  Encounter Vitals Group     BP      Systolic BP Percentile      Diastolic BP Percentile      Pulse      Resp      Temp      Temp src      SpO2      Weight      Height      Head Circumference      Peak Flow      Pain Score      Pain Loc      Pain Education      Exclude from Growth Chart    No data found.  Updated Vital Signs BP 133/87   Pulse 93   Temp 98.4 F (36.9 C)   Resp 19   LMP 07/11/2023   SpO2 98%    Physical Exam Vitals and nursing note reviewed.  Constitutional:      Appearance: Normal appearance. She is normal weight.  HENT:     Head: Normocephalic and atraumatic.     Mouth/Throat:     Mouth: Mucous membranes are moist.     Pharynx: Oropharynx is clear.  Eyes:     Extraocular Movements: Extraocular movements intact.     Conjunctiva/sclera: Conjunctivae normal.     Pupils: Pupils are equal, round, and reactive to light.  Cardiovascular:     Rate and Rhythm: Normal rate and regular rhythm.     Pulses: Normal pulses.     Heart sounds: Normal heart sounds.  Pulmonary:     Effort: Pulmonary effort is normal.     Breath sounds: Normal breath sounds. No wheezing, rhonchi or rales.  Musculoskeletal:        General: Normal range of motion.     Cervical back: Normal range of motion and neck supple.  Skin:    General: Skin is warm and dry.     Comments: Left great toe: Medial nail border): Erythematous with dried mucopurulent discharge noted-please see image below  Neurological:     General: No focal deficit present.     Mental Status: She is alert and oriented to person, place, and time. Mental status is at baseline.  Psychiatric:        Mood and Affect: Mood normal.        Behavior: Behavior normal.        Thought Content: Thought content normal.  UC Treatments / Results  Labs (all labs ordered are listed, but only abnormal  results are displayed) Labs Reviewed - No data to display  EKG   Radiology No results found.  Procedures Procedures (including critical care time)  Medications Ordered in UC Medications - No data to display  Initial Impression / Assessment and Plan / UC Course  I have reviewed the triage vital signs and the nursing notes.  Pertinent labs & imaging results that were available during my care of the patient were reviewed by me and considered in my medical decision making (see chart for details).     MDM: 1.  Cellulitis of great toe of left foot-Rx'd doxycycline 100 mg capsule: Take 1 capsule twice daily x 10 days; 2.  Paronychia of great toe, left-Rx'd doxycycline 100 mg capsule: Take 1 capsule twice daily x 10 days. Advised patient to take medication as directed with food to completion.  Encouraged to increase daily water intake to 64 ounces per day while taking these medications.  Advised if symptoms worsen and/or unresolved please follow-up with [facility] podiatry for further evaluation.  Contact information provided with his AVS this evening.  Discharged home, hemodynamically stable. Final Clinical Impressions(s) / UC Diagnoses   Final diagnoses:  Cellulitis of great toe of left foot  Paronychia of great toe, left     Discharge Instructions      Advised patient to take medication as directed with food to completion.  Encouraged to increase daily water intake to 64 ounces per day while taking these medications.  Advised if symptoms worsen and/or unresolved please follow-up with [facility] podiatry for further evaluation.  Contact information provided with his AVS this evening.     ED Prescriptions     Medication Sig Dispense Auth. Provider   doxycycline (VIBRAMYCIN) 100 MG capsule Take 1 capsule (100 mg total) by mouth 2 (two) times daily for 10 days. 20 capsule Trevor Iha, FNP      PDMP not reviewed this encounter.   Trevor Iha, FNP 07/23/23 1720

## 2023-07-23 NOTE — Discharge Instructions (Addendum)
Advised patient to take medication as directed with food to completion.  Encouraged to increase daily water intake to 64 ounces per day while taking these medications.  Advised if symptoms worsen and/or unresolved please follow-up with [facility] podiatry for further evaluation.  Contact information provided with his AVS this evening.

## 2023-07-26 ENCOUNTER — Encounter: Payer: Self-pay | Admitting: Podiatry

## 2023-07-26 ENCOUNTER — Ambulatory Visit (INDEPENDENT_AMBULATORY_CARE_PROVIDER_SITE_OTHER): Payer: No Typology Code available for payment source | Admitting: Podiatry

## 2023-07-26 DIAGNOSIS — L600 Ingrowing nail: Secondary | ICD-10-CM | POA: Diagnosis not present

## 2023-07-26 NOTE — Progress Notes (Signed)
  Subjective:  Patient ID: Maria Payne, female    DOB: [birth year],   MRN: [id number]  Chief Complaint  Patient presents with   Ingrown Toenail    Pt presents today for an ingrown toenail on the left.    32 y.o. female presents for concern of ingrown toenails of bilateral feet but mostly the left. Recently had an infection in this one and seen in urgent care and given antibiotics. It has been improving. She has had chronic trouble with ingrown nails and would like them removed.  . Denies any other pedal complaints. Denies n/v/f/c.   Past Medical History:  Diagnosis Date   Asthma    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   Admitted to [facility] on 08/21/11. Had diagnostic cerebral arteriogram.      Depression    GAD (generalized anxiety disorder) 04/04/2012   Papilledema    Diamox   Pseudotumor cerebri     Objective:  Physical Exam: Vascular: DP/PT pulses 2/4 bilateral. CFT <3 seconds. Normal hair growth on digits. No edema.  Skin. No lacerations or abrasions bilateral feet. Incurvation of bilateral borders of bilateral great toes. Mild tenderness mostly to lateral left great toe. Mild erythema and edema at this border noted.  Musculoskeletal: MMT 5/5 bilateral lower extremities in DF, PF, Inversion and Eversion. Deceased ROM in DF of ankle joint.  Neurological: Sensation intact to light touch.   Assessment:   1. Ingrown right greater toenail   2. Ingrown left greater toenail      Plan:  Patient was evaluated and treated and all questions answered. Discussed ingrown toenails etiology and treatment options including procedure for removal vs conservative care.  Patient requesting removal of ingrown nail today. Procedure below.  Discussed procedure and post procedure care and patient expressed understanding.  Will follow-up in 2 weeks for nail check or sooner if any problems arise.    Procedure:  Procedure: partial Nail Avulsion of bilaterally hallux bilateral nail border.   Surgeon: Louann Sjogren, DPM  Pre-op Dx: Ingrown toenail with and without infection Post-op: Same  Place of Surgery: Office exam room.  Indications for surgery: Painful and ingrown toenail.    The patient is requesting removal of nail with  chemical matrixectomy. Risks and complications were discussed with the patient for which they understand and written consent was obtained. Under sterile conditions a total of 3 mL of  1% lidocaine plain was infiltrated in a hallux block fashion. Once anesthetized, the skin was prepped in sterile fashion. A tourniquet was then applied. Next the bilateral aspect of hallux nail border was then sharply excised making sure to remove the entire offending nail border.  Next phenol was then applied under standard conditions to permanently destroy the matrix and copiously irrigated. Silvadene was applied. A dry sterile dressing was applied. After application of the dressing the tourniquet was removed and there is found to be an immediate capillary refill time to the digit. The patient tolerated the procedure well without any complications. Post procedure instructions were discussed the patient for which he verbally understood. Follow-up in two weeks for nail check or sooner if any problems are to arise. Discussed signs/symptoms of infection and directed to call the office immediately should any occur or go directly to the emergency room. In the meantime, encouraged to call the office with any questions, concerns, changes symptoms.   Louann Sjogren, DPM

## 2023-07-26 NOTE — Patient Instructions (Signed)

## 2023-08-06 ENCOUNTER — Encounter: Payer: Self-pay | Admitting: Podiatry

## 2023-10-16 ENCOUNTER — Encounter: Payer: Self-pay | Admitting: Family Medicine

## 2023-10-17 ENCOUNTER — Ambulatory Visit: Payer: No Typology Code available for payment source | Admitting: Family Medicine

## 2023-11-11 ENCOUNTER — Encounter: Payer: Self-pay | Admitting: Podiatry

## 2023-12-17 ENCOUNTER — Other Ambulatory Visit: Payer: Self-pay

## 2023-12-17 ENCOUNTER — Ambulatory Visit
Admission: EM | Admit: 2023-12-17 | Discharge: 2023-12-17 | Disposition: A | Discharge status: Home / Self Care | Attending: Family Medicine | Admitting: Family Medicine

## 2023-12-17 DIAGNOSIS — N10 Acute tubulo-interstitial nephritis: Secondary | ICD-10-CM | POA: Insufficient documentation

## 2023-12-17 DIAGNOSIS — Z87442 Personal history of urinary calculi: Secondary | ICD-10-CM | POA: Insufficient documentation

## 2023-12-17 DIAGNOSIS — R3129 Other microscopic hematuria: Secondary | ICD-10-CM | POA: Diagnosis not present

## 2023-12-17 LAB — POCT URINALYSIS DIP (MANUAL ENTRY)
Bilirubin, UA: NEGATIVE
Glucose, UA: NEGATIVE mg/dL
Ketones, POC UA: NEGATIVE mg/dL
Leukocytes, UA: NEGATIVE
Nitrite, UA: NEGATIVE
Protein Ur, POC: NEGATIVE mg/dL
Spec Grav, UA: 1.025 (ref 1.010–1.025)
Urobilinogen, UA: 0.2 U/dL
pH, UA: 5.5 (ref 5.0–8.0)

## 2023-12-17 MED ORDER — TAMSULOSIN HCL 0.4 MG PO CAPS: 0.4000 mg | ORAL_CAPSULE | Freq: Every day | ORAL | 0 refills | Status: DC

## 2023-12-17 MED ORDER — TRAMADOL HCL 50 MG PO TABS
50.0000 mg | ORAL_TABLET | Freq: Four times a day (QID) | ORAL | 0 refills | Status: DC | PRN
Start: 2023-12-17 — End: 2024-01-02

## 2023-12-17 MED ORDER — CIPROFLOXACIN HCL 500 MG PO TABS: 500.0000 mg | ORAL_TABLET | Freq: Two times a day (BID) | ORAL | 0 refills | Status: DC

## 2023-12-17 MED ORDER — CEFTRIAXONE SODIUM 1 G IJ SOLR
1000.0000 mg | Freq: Once | INTRAMUSCULAR | Status: AC
  Administered 2023-12-17: 1000 mg via INTRAMUSCULAR

## 2023-12-17 NOTE — Discharge Instructions (Signed)
 Drink lots of fluids Take Tylenol for mild pain Take tramadol for more severe pain Take Cipro 2 times a day for a week for infection Take the tamsulosin daily until symptoms improve.  This will help a stone pass If you become acutely worse, go to emergency room See your primary care and follow-up next week

## 2023-12-17 NOTE — ED Triage Notes (Signed)
 This morning woke up with bil flank pain, painful urination, pelvic pain, small amounts of urine when urinating. No fever. Has taken tylenol.

## 2023-12-17 NOTE — ED Provider Notes (Signed)
 [facility]    CSN: [id number] Arrival date & time: 12/17/23  1700      History   Chief Complaint Chief Complaint  Patient presents with   Dysuria    HPI Maria Payne is a 32 y.o. female.   HPI Patient has a history of kidney stones in the past She has a history of urinary tract infections bladder infections and kidney infections Patient states that last night she started noticing urinary frequency.  She felt like she had to urinate, but only 1 small amounts.  She had pain with urination.  Today she has developed flank pain, decreased appetite, low-grade fever, and continues with her urinary symptoms.  She feels like she has a kidney infection.  Past Medical History:  Diagnosis Date   Asthma    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   Admitted to [facility] on 08/21/11. Had diagnostic cerebral arteriogram.      Depression    GAD (generalized anxiety disorder) 04/04/2012   Papilledema    Diamox   Pseudotumor cerebri     Patient Active Problem List   Diagnosis Date Noted   Low libido 08/01/2022   IFG (impaired fasting glucose) 08/01/2022   Encounter for weight management 03/14/2021   PCOS (polycystic ovarian syndrome) 03/14/2021   Radiculitis of left cervical region 02/22/2021   History of preterm delivery 10/28/2019   History of severe pre-eclampsia 10/27/2019   Status post ventriculoatrial shunt placement 09/13/2019   Severe persistent asthma with exacerbation 09/09/2019   Hypertension 08/14/2019   BMI 38.0-38.9,adult 04/23/2019   S/P ventriculoperitoneal shunt 11/30/2016   Psychic factors associated with diseases classified elsewhere 12/31/2013   MDD (major depressive disorder) 04/04/2012   GAD (generalized anxiety disorder) 04/04/2012   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   IIH (idiopathic intracranial hypertension) 09/10/2011   Papilledema associated with increased intracranial pressure 08/20/2011    Past Surgical History:  Procedure  Laterality Date   ANGIOPLASTY  2012   ? cerebral artery    CHOLECYSTECTOMY     SHUNT REVISION     4 times, last time 07/24/19   SLEEVE GASTROPLASTY  05/2014   Dr. Adolphus Birchwood    Stent placed  August 2014   Stenosis right transverse sigmoid junction,[facility]   VENTRICULAR ATRIAL SHUNT     VP shunt  11/2013   Done at [facility]    OB History     Gravida  1   Para  1   Term  0   Preterm  1   AB  0   Living  1      SAB  0   IAB  0   Ectopic  0   Multiple  0   Live Births  1            Home Medications    Prior to Admission medications   Medication Sig Start Date End Date Taking? Authorizing Provider  ciprofloxacin (CIPRO) 500 MG tablet Take 1 tablet (500 mg total) by mouth 2 (two) times daily. 12/17/23  Yes Eustace Moore, MD  tamsulosin (FLOMAX) 0.4 MG CAPS capsule Take 1 capsule (0.4 mg total) by mouth daily after supper. 12/17/23  Yes Eustace Moore, MD  traMADol (ULTRAM) 50 MG tablet Take 1-2 tablets (50-100 mg total) by mouth every 6 (six) hours as needed. 12/17/23  Yes Eustace Moore, MD  albuterol (VENTOLIN HFA) 108 (90 Base) MCG/ACT inhaler Inhale 2 puffs into the lungs every 4 (four) hours as needed for wheezing.  09/26/20   Jomarie Longs, PA-C  aspirin 81 MG chewable tablet Chew by mouth daily.    [provider]  fluticasone (FLONASE) 50 MCG/ACT nasal spray Place 2 sprays into both nostrils daily. 02/22/23   Delorse Lek, FNP  losartan (COZAAR) 25 MG tablet Take 1 tablet (25 mg total) by mouth daily. 04/04/23   Agapito Games, MD  topiramate (TOPAMAX) 25 MG tablet Take 25 mg by mouth daily. Pt report she taking two tablets daily 08/13/22   [provider]    Family History Family History  Problem Relation Age of Onset   Diabetes Mother    Hypertension Father    Heart disease Father    Diabetes Father    Hyperlipidemia Sister    Endometriosis Sister    Thyroid disease Sister    Colon cancer Neg Hx    Stomach cancer Neg Hx     Pancreatic cancer Neg Hx    Esophageal cancer Neg Hx    Rectal cancer Neg Hx     Social History Social History   Tobacco Use   Smoking status: Never   Smokeless tobacco: Never  Vaping Use   Vaping status: Never Used  Substance Use Topics   Alcohol use: Not Currently   Drug use: No     Allergies   Latex, Nsaids, Codeine, Hydrocodone-acetaminophen, Oxycodone, and Tape Discussed allergies with patient.  She states she has taken tramadol without side effects  Review of Systems Review of Systems See HPI  Physical Exam Triage Vital Signs ED Triage Vitals  Encounter Vitals Group     BP 12/17/23 1708 134/84     Systolic BP Percentile --      Diastolic BP Percentile --      Pulse Rate 12/17/23 1708 92     Resp 12/17/23 1708 18     Temp 12/17/23 1708 98.5 F (36.9 C)     Temp src --      SpO2 12/17/23 1708 98 %     Weight --      Height --      Head Circumference --      Peak Flow --      Pain Score 12/17/23 1711 4     Pain Loc --      Pain Education --      Exclude from Growth Chart --    No data found.  Updated Vital Signs BP 134/84   Pulse 92   Temp 98.5 F (36.9 C)   Resp 18   LMP 11/26/2023 (Approximate)   SpO2 98%      Physical Exam Constitutional:      General: She is in acute distress.     Appearance: She is well-developed.     Comments: Appears uncomfortable  HENT:     Head: Normocephalic and atraumatic.  Eyes:     Conjunctiva/sclera: Conjunctivae normal.     Pupils: Pupils are equal, round, and reactive to light.  Cardiovascular:     Rate and Rhythm: Normal rate and regular rhythm.     Heart sounds: Normal heart sounds.  Pulmonary:     Effort: Pulmonary effort is normal. No respiratory distress.     Breath sounds: Normal breath sounds.  Abdominal:     General: Abdomen is flat. There is no distension.     Palpations: Abdomen is soft.     Tenderness: There is abdominal tenderness. There is right CVA tenderness and left CVA tenderness.  Comments: Tenderness to deep palpation in the right middle abdomen over the kidney.  Mild tenderness on the left.  Mild suprapubic tenderness.  No organomegaly  Musculoskeletal:        General: Normal range of motion.     Cervical back: Normal range of motion.     Comments: No tenderness in the lumbar spine or lumbar musculature  Skin:    General: Skin is warm and dry.  Neurological:     Mental Status: She is alert.     Gait: Gait normal.      UC Treatments / Results  Labs (all labs ordered are listed, but only abnormal results are displayed) Labs Reviewed  POCT URINALYSIS DIP (MANUAL ENTRY) - Abnormal; Notable for the following components:      Result Value   Blood, UA trace-lysed (*)    All other components within normal limits  URINE CULTURE    EKG   Radiology No results found.  Procedures Procedures (including critical care time)  Medications Ordered in UC Medications  cefTRIAXone (ROCEPHIN) injection 1,000 mg (1,000 mg Intramuscular Given 12/17/23 1743)    Initial Impression / Assessment and Plan / UC Course  I have reviewed the triage vital signs and the nursing notes.  Pertinent labs & imaging results that were available during my care of the patient were reviewed by me and considered in my medical decision making (see chart for details).     Explained the difficulty that she had trace hematuria.  It is impossible for me to tell if she has a small kidney stone or kidney infection.  Some of her symptoms suggest infection so I think we should treat her for that.  She was given a gram of Rocephin and Cipro.  In addition pain management and tamsulosin.  She will follow-up with her PCP.  She understands she must go the emergency room if she becomes worse instead of better Final Clinical Impressions(s) / UC Diagnoses   Final diagnoses:  Acute pyelonephritis  Personal history of kidney stones  Other microscopic hematuria     Discharge Instructions      Drink  lots of fluids Take Tylenol for mild pain Take tramadol for more severe pain Take Cipro 2 times a day for a week for infection Take the tamsulosin daily until symptoms improve.  This will help a stone pass If you become acutely worse, go to emergency room See your primary care and follow-up next week     ED Prescriptions     Medication Sig Dispense Auth. Provider   traMADol (ULTRAM) 50 MG tablet Take 1-2 tablets (50-100 mg total) by mouth every 6 (six) hours as needed. 15 tablet Eustace Moore, MD   ciprofloxacin (CIPRO) 500 MG tablet Take 1 tablet (500 mg total) by mouth 2 (two) times daily. 14 tablet Eustace Moore, MD   tamsulosin (FLOMAX) 0.4 MG CAPS capsule Take 1 capsule (0.4 mg total) by mouth daily after supper. 10 capsule Eustace Moore, MD      I have reviewed the PDMP during this encounter.   Eustace Moore, MD 12/17/23 [phone]

## 2023-12-19 LAB — URINE CULTURE: Special Requests: NORMAL

## 2024-01-02 ENCOUNTER — Ambulatory Visit: Admitting: Family Medicine

## 2024-01-02 ENCOUNTER — Encounter: Payer: Self-pay | Admitting: Family Medicine

## 2024-01-02 VITALS — BP 128/74 | HR 93 | Ht 68.0 in | Wt 279.0 lb

## 2024-01-02 DIAGNOSIS — I10 Essential (primary) hypertension: Secondary | ICD-10-CM | POA: Diagnosis not present

## 2024-01-02 DIAGNOSIS — R7301 Impaired fasting glucose: Secondary | ICD-10-CM | POA: Diagnosis not present

## 2024-01-02 DIAGNOSIS — F411 Generalized anxiety disorder: Secondary | ICD-10-CM | POA: Diagnosis not present

## 2024-01-02 DIAGNOSIS — G932 Benign intracranial hypertension: Secondary | ICD-10-CM | POA: Diagnosis not present

## 2024-01-02 LAB — POCT GLYCOSYLATED HEMOGLOBIN (HGB A1C): Hemoglobin A1C: 5.6 % (ref 4.0–5.6)

## 2024-01-02 MED ORDER — LOSARTAN POTASSIUM 50 MG PO TABS: 50.0000 mg | ORAL_TABLET | Freq: Every day | ORAL | 1 refills | Status: AC

## 2024-01-02 MED ORDER — TIRZEPATIDE-WEIGHT MANAGEMENT 2.5 MG/0.5ML ~~LOC~~ SOLN: 2.5000 mg | SUBCUTANEOUS | 0 refills | Status: AC

## 2024-01-02 MED ORDER — ESCITALOPRAM OXALATE 10 MG PO TABS: ORAL_TABLET | ORAL | 1 refills | Status: DC

## 2024-01-02 NOTE — Progress Notes (Unsigned)
   Established Patient Office Visit  Subjective  Patient ID: Maria Payne, female    DOB: [birth year]  Age: 33 y.o. MRN: [id number]  Chief Complaint  Patient presents with  . ifg  . Hypertension    HPI  Hypertension- Pt denies chest pain, SOB, dizziness, or heart palpitations.  Taking meds as directed w/o problems.  Denies medication side effects.  Says BP running a little higher at home.    Has gained weight back since being off Ozempic .    Impaired fasting glucose-no increased thirst or urination. No symptoms consistent with hypoglycemia.   {History (Optional):23778}  ROS    Objective:     BP 134/89   Pulse 93   Ht 5\' 8"  (1.727 m)   Wt 279 lb (126.6 kg)   LMP 11/26/2023 (Approximate)   SpO2 99%   BMI 42.42 kg/m  {Vitals History (Optional):23777}  Physical Exam Vitals and nursing note reviewed.  Constitutional:      Appearance: Normal appearance.  HENT:     Head: Normocephalic and atraumatic.  Eyes:     Conjunctiva/sclera: Conjunctivae normal.  Cardiovascular:     Rate and Rhythm: Normal rate and regular rhythm.  Pulmonary:     Effort: Pulmonary effort is normal.     Breath sounds: Normal breath sounds.  Skin:    General: Skin is warm and dry.  Neurological:     Mental Status: She is alert.  Psychiatric:        Mood and Affect: Mood normal.     Results for orders placed or performed in visit on 01/02/24  POCT HgB A1C  Result Value Ref Range   Hemoglobin A1C 5.6 4.0 - 5.6 %   HbA1c POC (<> result, manual entry)     HbA1c, POC (prediabetic range)     HbA1c, POC (controlled diabetic range)      {Labs (Optional):23779}  The ASCVD Risk score (Arnett DK, et al., 2019) failed to calculate for the following reasons:   The 2019 ASCVD risk score is only valid for ages 27 to 9    Assessment & Plan:   Problem List Items Addressed This Visit       Cardiovascular and Mediastinum   Hypertension   In losartan  to 50mg  F/U in 8 weeks.        Relevant Medications   losartan  (COZAAR ) 50 MG tablet   Other Relevant Orders   CMP14+EGFR   Lipid Panel With LDL/HDL Ratio     Endocrine   IFG (impaired fasting glucose) - Primary   Relevant Orders   POCT HgB A1C (Completed)   CMP14+EGFR   Lipid Panel With LDL/HDL Ratio     Nervous and Auditory   IIH (idiopathic intracranial hypertension)   More freqeunt HA recently.         Other   GAD (generalized anxiety disorder)   Feels like her anxiety is really revved up recently.  She does not really feel down or depressed.  Work has been a little bit stressful but it accommodates her being able to take the kids to school and practice.  But she has been keeping on an eye out for her new job.      Relevant Medications   escitalopram  (LEXAPRO ) 10 MG tablet    No follow-ups on file.    Duaine German, MD

## 2024-01-02 NOTE — Assessment & Plan Note (Signed)
 Feels like her anxiety is really revved up recently.  She does not really feel down or depressed.  Work has been a little bit stressful but it accommodates her being able to take the kids to school and practice.  But she has been keeping on an eye out for her new job.

## 2024-01-02 NOTE — Assessment & Plan Note (Signed)
 More freqeunt HA recently.

## 2024-01-02 NOTE — Assessment & Plan Note (Signed)
 Inc losartan  to 50mg  F/U in 8 weeks.

## 2024-01-03 ENCOUNTER — Encounter: Payer: Self-pay | Admitting: Family Medicine

## 2024-01-03 LAB — CMP14+EGFR
ALT: 15 IU/L (ref 0–32)
AST: 14 IU/L (ref 0–40)
Albumin: 4.3 g/dL (ref 3.9–4.9)
Alkaline Phosphatase: 57 IU/L (ref 44–121)
BUN/Creatinine Ratio: 27 — ABNORMAL HIGH (ref 9–23)
BUN: 17 mg/dL (ref 6–20)
Bilirubin Total: <0.2 mg/dL (ref 0.0–1.2)
CO2: 26 mmol/L (ref 20–29)
Calcium: 9.8 mg/dL (ref 8.7–10.2)
Chloride: 101 mmol/L (ref 96–106)
Creatinine, Ser: 0.64 mg/dL (ref 0.57–1.00)
Globulin, Total: 2.3 g/dL (ref 1.5–4.5)
Glucose: 86 mg/dL (ref 70–99)
Potassium: 4.4 mmol/L (ref 3.5–5.2)
Sodium: 140 mmol/L (ref 134–144)
Total Protein: 6.6 g/dL (ref 6.0–8.5)
eGFR: 120 mL/min/{1.73_m2} (ref >59)

## 2024-01-03 LAB — LIPID PANEL WITH LDL/HDL RATIO
Cholesterol, Total: 201 mg/dL — ABNORMAL HIGH (ref 100–199)
HDL: 48 mg/dL (ref >39)
LDL Chol Calc (NIH): 112 mg/dL — ABNORMAL HIGH (ref 0–99)
LDL/HDL Ratio: 2.3 ratio (ref 0.0–3.2)
Triglycerides: 237 mg/dL — ABNORMAL HIGH (ref 0–149)
VLDL Cholesterol Cal: 41 mg/dL — ABNORMAL HIGH (ref 5–40)

## 2024-01-03 NOTE — Assessment & Plan Note (Signed)
 A1c still looks good.  Continue with healthy diet and regular exercise so she is doing a great job on maintaining that.  Lab Results  Component Value Date   HGBA1C 5.6 01/02/2024

## 2024-01-03 NOTE — Progress Notes (Signed)
 Hi Remie, metabolic panel overall is okay.  LDL and triglycerides are little elevated.  Just continue to work on healthy diet and regular exercise and to you have been pretty diligent about that.

## 2024-02-11 ENCOUNTER — Encounter: Payer: Self-pay | Admitting: Family Medicine

## 2024-03-13 ENCOUNTER — Other Ambulatory Visit: Payer: Self-pay | Admitting: Family Medicine

## 2024-03-13 DIAGNOSIS — F411 Generalized anxiety disorder: Secondary | ICD-10-CM

## 2024-04-15 ENCOUNTER — Encounter: Payer: Self-pay | Admitting: Family Medicine

## 2024-04-15 DIAGNOSIS — L989 Disorder of the skin and subcutaneous tissue, unspecified: Secondary | ICD-10-CM

## 2024-04-17 NOTE — Telephone Encounter (Signed)
 Since getting largers lets refer to Derm. Ok to place referral

## 2024-04-17 NOTE — Telephone Encounter (Signed)
 Pended referral  What diagnosis code would you like to use ?

## 2024-05-03 ENCOUNTER — Ambulatory Visit
Admission: EM | Admit: 2024-05-03 | Discharge: 2024-05-03 | Disposition: A | Discharge status: Home / Self Care | Attending: Family Medicine | Admitting: Family Medicine

## 2024-05-03 ENCOUNTER — Other Ambulatory Visit: Payer: Self-pay

## 2024-05-03 DIAGNOSIS — H6691 Otitis media, unspecified, right ear: Secondary | ICD-10-CM | POA: Diagnosis not present

## 2024-05-03 DIAGNOSIS — R059 Cough, unspecified: Secondary | ICD-10-CM | POA: Diagnosis not present

## 2024-05-03 LAB — POC SARS CORONAVIRUS 2 AG -  ED: SARS Coronavirus 2 Ag: NEGATIVE

## 2024-05-03 MED ORDER — AMOXICILLIN-POT CLAVULANATE 875-125 MG PO TABS: 1.0000 | ORAL_TABLET | Freq: Two times a day (BID) | ORAL | 0 refills | Status: AC

## 2024-05-03 MED ORDER — PREDNISONE 20 MG PO TABS: ORAL_TABLET | ORAL | 0 refills | Status: AC

## 2024-05-03 NOTE — Discharge Instructions (Addendum)
 Advised patient take medications as directed with food to completion.  Advised patient to take prednisone  with first dose of Augmentin  for the next 5 of 10 days.  Encouraged to increase daily water intake to 64 ounces per day while taking this medication.  Advised if symptoms worsen and/or unresolved please follow-up with your PCP, ENT, or here for further evaluation.

## 2024-05-03 NOTE — ED Provider Notes (Signed)
 [facility]    CSN: [id number] Arrival date & time: 05/03/24  9062      History   Chief Complaint Chief Complaint  Patient presents with   URI    HPI Maria Payne is a 33 y.o. female.   HPI 33 year old female presents with bilateral ear pain, sore throat, cough, and congestion for 3 days.  Reports taking OTC Sudafed and DayQuil.  PMH significant for asthma, obesity, and history of cerebral venous sinus thrombus.  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 on 08/21/11. Had diagnostic cerebral arteriogram.      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 cerebral artery    CHOLECYSTECTOMY     SHUNT REVISION     4 times, last time 07/24/19   SLEEVE GASTROPLASTY  05/2014   Dr. Dela    Stent placed  August 2014   Stenosis right transverse sigmoid junction,[facility]   VENTRICULAR ATRIAL SHUNT     VP shunt  11/2013   Done at [facility]    OB History     Gravida  1   Para   1   Term  0   Preterm  1   AB  0   Living  1      SAB  0   IAB  0   Ectopic  0   Multiple  0   Live Births  1            Home Medications    Prior to Admission medications   Medication Sig Start Date End Date Taking? Authorizing Provider  amoxicillin -clavulanate (AUGMENTIN ) 875-125 MG tablet Take 1 tablet by mouth 2 (two) times daily for 10 days. 05/03/24 05/13/24 Yes Teddy Sharper, FNP  predniSONE  (DELTASONE ) 20 MG tablet Take 3 tabs PO daily x 5 days. 05/03/24  Yes Teddy Sharper, FNP  albuterol  (VENTOLIN  HFA) 108 (90 Base) MCG/ACT inhaler Inhale 2 puffs into the lungs every 4 (four) hours as needed for wheezing. 09/26/20   Breeback, Jade L, PA-C  aspirin  81 MG chewable tablet Chew by mouth daily.    [provider]  escitalopram  (LEXAPRO ) 10 MG tablet Take 1 tablet (10 mg total) by mouth daily. 03/13/24 05/12/24  Alvan Dorothyann BIRCH, MD  fluticasone  (FLONASE ) 50 MCG/ACT nasal spray Place 2 sprays into both nostrils daily. 02/22/23   Blair, Diane W, FNP  losartan  (COZAAR ) 50 MG tablet Take 1 tablet (50 mg total) by mouth daily. 01/02/24   Alvan Dorothyann BIRCH, MD  tirzepatide  (  ZEPBOUND ) 2.5 MG/0.5ML injection vial Inject 2.5 mg into the skin once a week. 01/02/24   Alvan Dorothyann BIRCH, MD  topiramate (TOPAMAX) 25 MG tablet Take 25 mg by mouth daily. Pt report she taking two tablets daily 08/13/22   [provider]    Family History Family History  Problem Relation Age of Onset   Diabetes Mother    Hypertension Father    Heart disease Father    Diabetes Father    Hyperlipidemia Sister    Endometriosis Sister    Thyroid  disease Sister    Colon cancer Neg Hx    Stomach cancer Neg Hx    Pancreatic cancer Neg Hx    Esophageal cancer Neg Hx    Rectal cancer Neg Hx     Social History Social History   Tobacco Use   Smoking status: Never   Smokeless tobacco: Never  Vaping Use   Vaping status: Never Used  Substance Use Topics   Alcohol use: Not  Currently   Drug use: No     Allergies   Latex, Nsaids, Codeine, Hydrocodone -acetaminophen , Oxycodone, and Tape   Review of Systems Review of Systems  HENT:  Positive for congestion, ear pain and rhinorrhea.   Respiratory:  Positive for cough.   All other systems reviewed and are negative.    Physical Exam Triage Vital Signs ED Triage Vitals  Encounter Vitals Group     BP 05/03/24 1010 (!) 140/93     Girls Systolic BP Percentile --      Girls Diastolic BP Percentile --      Boys Systolic BP Percentile --      Boys Diastolic BP Percentile --      Pulse Rate 05/03/24 1010 86     Resp 05/03/24 1010 19     Temp 05/03/24 1010 98.7 F (37.1 C)     Temp src --      SpO2 05/03/24 1010 98 %     Weight --      Height --      Head Circumference --      Peak Flow --      Pain Score 05/03/24 1009 3     Pain Loc --      Pain Education --      Exclude from Growth Chart --    No data found.  Updated Vital Signs BP (!) 140/93   Pulse 86   Temp 98.7 F (37.1 C)   Resp 19   LMP 04/12/2024   SpO2 98%   Physical Exam Vitals and nursing note reviewed.  Constitutional:      Appearance: Normal appearance. She is obese.  HENT:     Head: Normocephalic and atraumatic.     Right Ear: Ear canal and external ear normal.     Left Ear: Tympanic membrane, ear canal and external ear normal.     Ears:     Comments: Right TM: Erythematous, bulging    Mouth/Throat:     Mouth: Mucous membranes are moist.     Pharynx: Oropharynx is clear.  Eyes:     Extraocular Movements: Extraocular movements intact.     Pupils: Pupils are equal, round, and reactive to light.  Cardiovascular:     Rate and Rhythm: Normal rate and regular rhythm.     Pulses: Normal pulses.     Heart sounds: Normal heart sounds.  Pulmonary:     Effort: Pulmonary effort is normal.     Breath sounds: Normal breath  sounds. No wheezing, rhonchi or rales.     Comments: Infrequent nonproductive cough on  exam Musculoskeletal:        General: Normal range of motion.  Skin:    General: Skin is warm and dry.  Neurological:     General: No focal deficit present.     Mental Status: She is alert and oriented to person, place, and time. Mental status is at baseline.  Psychiatric:        Mood and Affect: Mood normal.        Behavior: Behavior normal.      UC Treatments / Results  Labs (all labs ordered are listed, but only abnormal results are displayed) Labs Reviewed  POC SARS CORONAVIRUS 2 AG -  ED    EKG   Radiology No results found.  Procedures Procedures (including critical care time)  Medications Ordered in UC Medications - No data to display  Initial Impression / Assessment and Plan / UC Course  I have reviewed the triage vital signs and the nursing notes.  Pertinent labs & imaging results that were available during my care of the patient were reviewed by me and considered in my medical decision making (see chart for details).     MDM: 1.  Acute right otitis media-Rx'd Augmentin  875/125 mg tablet: Take 1 tablet twice daily x 10 days; 2.  Cough, unspecified type, Rx'd prednisone  20 mg tablet: Take 3 tablets p.o. daily x 5 days. Advised patient take medications as directed with food to completion.  Advised patient to take prednisone  with first dose of Augmentin  for the next 5 of 10 days.  Encouraged to increase daily water intake to 64 ounces per day while taking this medication.  Advised if symptoms worsen and/or unresolved please follow-up with your PCP, ENT, or here for further evaluation.  Patient discharged home, hemodynamically stable.  Final Clinical Impressions(s) / UC Diagnoses   Final diagnoses:  Cough, unspecified type  Acute right otitis media     Discharge Instructions      Advised patient take medications as directed with food to completion.  Advised patient to take prednisone  with first dose of Augmentin  for the next 5 of 10 days.  Encouraged to  increase daily water intake to 64 ounces per day while taking this medication.  Advised if symptoms worsen and/or unresolved please follow-up with your PCP, ENT, or here for further evaluation.     ED Prescriptions     Medication Sig Dispense Auth. Provider   amoxicillin -clavulanate (AUGMENTIN ) 875-125 MG tablet Take 1 tablet by mouth 2 (two) times daily for 10 days. 20 tablet Alex Mcmanigal, FNP   predniSONE  (DELTASONE ) 20 MG tablet Take 3 tabs PO daily x 5 days. 15 tablet Antinio Sanderfer, FNP      PDMP not reviewed this encounter.   Teddy Sharper, FNP 05/03/24 [phone]

## 2024-05-03 NOTE — ED Triage Notes (Signed)
 Pt presents to [facility] with co otalgia, sore throat, cough, congestion since Thursday. Pt has been taking sudafed and dayquill.

## 2024-07-30 ENCOUNTER — Ambulatory Visit: Admitting: Dermatology

## 2024-09-05 ENCOUNTER — Encounter

## 2024-09-21 ENCOUNTER — Ambulatory Visit: Admitting: Obstetrics & Gynecology

## 2024-09-21 ENCOUNTER — Encounter: Payer: Self-pay | Admitting: Obstetrics & Gynecology

## 2024-09-21 VITALS — BP 138/88 | HR 97 | Ht 68.0 in | Wt 266.0 lb

## 2024-09-21 DIAGNOSIS — N764 Abscess of vulva: Secondary | ICD-10-CM | POA: Diagnosis not present

## 2024-09-21 MED ORDER — SULFAMETHOXAZOLE-TRIMETHOPRIM 800-160 MG PO TABS: 1.0000 | ORAL_TABLET | Freq: Two times a day (BID) | ORAL | 0 refills | Status: AC

## 2024-09-21 NOTE — Progress Notes (Signed)
" ° °  Subjective:    Patient ID: Maria Payne, female    DOB: Jan 17, 1991, 34 y.o.   MRN: [id number]  HPI  34 yo female c/o right sided labial mass 24 hours ago and has slowly grown. PT has hx of Bartholin's gland cyst 8 years ago that need to be lanced.  Has also had an ingrown hair in 2025.    Review of Systems  Constitutional: Negative.   Respiratory: Negative.    Cardiovascular: Negative.   Gastrointestinal: Negative.   Genitourinary:  Positive for vaginal pain.       Vulvar mass       Objective:   Physical Exam Vitals reviewed.  Constitutional:      General: She is not in acute distress.    Appearance: She is well-developed.  HENT:     Head: Normocephalic and atraumatic.  Eyes:     Conjunctiva/sclera: Conjunctivae normal.  Cardiovascular:     Rate and Rhythm: Normal rate.  Pulmonary:     Effort: Pulmonary effort is normal.  Genitourinary:  Skin:    General: Skin is warm and dry.  Neurological:     Mental Status: She is alert and oriented to person, place, and time.  Psychiatric:        Mood and Affect: Mood normal.    Vitals:   09/21/24 1525  BP: 138/88  Pulse: 97  Weight: 266 lb (120.7 kg)  Height: 5' 8 (1.727 m)       Assessment & Plan:   34 yo female with 1-2 cm right labial majora abscess   Incision and drainage of right labial majora abscess Stiz baths bid for 3 days Bactrim  x 7 dAYS   VULVAR I & D NOTE  The indications for vulvar I & D were reviewed.   Risks of the biopsy including pain, bleeding, infection were discussed. The patient stated understanding and agreed to undergo procedure today. Consent was signed,  time out performed.  The patient's vulva was prepped with Betadine. Prilocaine was applied topically for 20 mins.  1% lidocaine  was injected into the right labia.  A scalpel was used to incise the caruncle and a small amount of purulent material was expressed.   Small bleeding was noted and hemostasis was achieved using direct pressure.   The patient tolerated the procedure well. Post-procedure instructions  (pelvic rest for one week) were given to the patient. The patient is to call with heavy bleeding, fever greater than 100.4, foul smelling vaginal discharge or other concerns.   "

## 2024-10-08 ENCOUNTER — Ambulatory Visit: Admitting: Dermatology
